# Patient Record
Sex: Female | Born: 1995 | Race: Black or African American | Hispanic: No | Marital: Single | Smoking: Never smoker
Health system: Southern US, Community
[De-identification: ages and names within clinical notes are randomized; demographics above are authoritative.]

## PROBLEM LIST (undated history)

## (undated) ENCOUNTER — Inpatient Hospital Stay (HOSPITAL_COMMUNITY): Payer: Self-pay

## (undated) ENCOUNTER — Inpatient Hospital Stay: Payer: Self-pay

## (undated) DIAGNOSIS — N83209 Unspecified ovarian cyst, unspecified side: Secondary | ICD-10-CM

## (undated) DIAGNOSIS — T8859XA Other complications of anesthesia, initial encounter: Secondary | ICD-10-CM

## (undated) DIAGNOSIS — J45909 Unspecified asthma, uncomplicated: Secondary | ICD-10-CM

## (undated) DIAGNOSIS — N390 Urinary tract infection, site not specified: Secondary | ICD-10-CM

## (undated) DIAGNOSIS — Z8759 Personal history of other complications of pregnancy, childbirth and the puerperium: Secondary | ICD-10-CM

## (undated) DIAGNOSIS — N879 Dysplasia of cervix uteri, unspecified: Secondary | ICD-10-CM

## (undated) DIAGNOSIS — D649 Anemia, unspecified: Secondary | ICD-10-CM

## (undated) DIAGNOSIS — R87629 Unspecified abnormal cytological findings in specimens from vagina: Secondary | ICD-10-CM

## (undated) DIAGNOSIS — T4145XA Adverse effect of unspecified anesthetic, initial encounter: Secondary | ICD-10-CM

## (undated) DIAGNOSIS — I10 Essential (primary) hypertension: Secondary | ICD-10-CM

## (undated) HISTORY — DX: Urinary tract infection, site not specified: N39.0

## (undated) HISTORY — DX: Dysplasia of cervix uteri, unspecified: N87.9

## (undated) HISTORY — PX: THERAPEUTIC ABORTION: SHX798

---
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
## 2015-05-21 ENCOUNTER — Emergency Department
Admission: EM | Admit: 2015-05-21 | Discharge: 2015-05-21 | Disposition: A | Discharge status: Home / Self Care | Payer: Medicaid Other

## 2015-05-22 ENCOUNTER — Encounter: Payer: Self-pay | Admitting: Emergency Medicine

## 2015-05-22 ENCOUNTER — Emergency Department
Admission: EM | Admit: 2015-05-22 | Discharge: 2015-05-22 | Disposition: A | Discharge status: Home / Self Care | Payer: BLUE CROSS/BLUE SHIELD | Attending: Emergency Medicine | Admitting: Emergency Medicine

## 2015-05-22 DIAGNOSIS — N390 Urinary tract infection, site not specified: Secondary | ICD-10-CM | POA: Insufficient documentation

## 2015-05-22 DIAGNOSIS — A5901 Trichomonal vulvovaginitis: Secondary | ICD-10-CM | POA: Insufficient documentation

## 2015-05-22 DIAGNOSIS — Z880 Allergy status to penicillin: Secondary | ICD-10-CM | POA: Diagnosis not present

## 2015-05-22 DIAGNOSIS — N898 Other specified noninflammatory disorders of vagina: Secondary | ICD-10-CM | POA: Diagnosis present

## 2015-05-22 DIAGNOSIS — Z3202 Encounter for pregnancy test, result negative: Secondary | ICD-10-CM | POA: Insufficient documentation

## 2015-05-22 DIAGNOSIS — Z87891 Personal history of nicotine dependence: Secondary | ICD-10-CM | POA: Insufficient documentation

## 2015-05-22 LAB — POCT PREGNANCY, URINE: Preg Test, Ur: NEGATIVE

## 2015-05-22 LAB — URINALYSIS COMPLETE WITH MICROSCOPIC (ARMC ONLY)
Bilirubin Urine: NEGATIVE
Glucose, UA: NEGATIVE mg/dL
Hgb urine dipstick: NEGATIVE
Ketones, ur: NEGATIVE mg/dL
Nitrite: NEGATIVE
Protein, ur: 30 mg/dL — AB
Specific Gravity, Urine: 1.028 (ref 1.005–1.030)
pH: 5 (ref 5.0–8.0)

## 2015-05-22 LAB — CHLAMYDIA/NGC RT PCR (ARMC ONLY)
Chlamydia Tr: NOT DETECTED
N gonorrhoeae: NOT DETECTED

## 2015-05-22 LAB — WET PREP, GENITAL
Clue Cells Wet Prep HPF POC: NONE SEEN
Yeast Wet Prep HPF POC: NONE SEEN

## 2015-05-22 MED ORDER — METRONIDAZOLE 500 MG PO TABS: 500.0000 mg | ORAL_TABLET | Freq: Two times a day (BID) | ORAL | Status: DC

## 2015-05-22 MED ORDER — CIPROFLOXACIN HCL 500 MG PO TABS: 500.0000 mg | ORAL_TABLET | Freq: Two times a day (BID) | ORAL | Status: DC

## 2015-05-22 NOTE — Discharge Instructions (Signed)
Trichomoniasis °Trichomoniasis is an infection caused by an organism called Trichomonas. The infection can affect both women and men. In women, the outer female genitalia and the vagina are affected. In men, the penis is mainly affected, but the prostate and other reproductive organs can also be involved. Trichomoniasis is a sexually transmitted infection (STI) and is most often passed to another person through sexual contact.  °RISK FACTORS °· Having unprotected sexual intercourse. °· Having sexual intercourse with an infected partner. °SIGNS AND SYMPTOMS  °Symptoms of trichomoniasis in women include: °· Abnormal gray-green frothy vaginal discharge. °· Itching and irritation of the vagina. °· Itching and irritation of the area outside the vagina. °Symptoms of trichomoniasis in men include:  °· Penile discharge with or without pain. °· Pain during urination. This results from inflammation of the urethra. °DIAGNOSIS  °Trichomoniasis may be found during a Pap test or physical exam. Your health care provider may use one of the following methods to help diagnose this infection: °· Examining vaginal discharge under a microscope. For men, urethral discharge would be examined. °· Testing the pH of the vagina with a test tape. °· Using a vaginal swab test that checks for the Trichomonas organism. A test is available that provides results within a few minutes. °· Doing a culture test for the organism. This is not usually needed. °TREATMENT  °· You may be given medicine to fight the infection. Women should inform their health care provider if they could be or are pregnant. Some medicines used to treat the infection should not be taken during pregnancy. °· Your health care provider may recommend over-the-counter medicines or creams to decrease itching or irritation. °· Your sexual partner will need to be treated if infected. °HOME CARE INSTRUCTIONS  °· Take medicines only as directed by your health care provider. °· Take  over-the-counter medicine for itching or irritation as directed by your health care provider. °· Do not have sexual intercourse while you have the infection. °· Women should not douche or wear tampons while they have the infection. °· Discuss your infection with your partner. Your partner may have gotten the infection from you, or you may have gotten it from your partner. °· Have your sex partner get examined and treated if necessary. °· Practice safe, informed, and protected sex. °· See your health care provider for other STI testing. °SEEK MEDICAL CARE IF:  °· You still have symptoms after you finish your medicine. °· You develop abdominal pain. °· You have pain when you urinate. °· You have bleeding after sexual intercourse. °· You develop a rash. °· Your medicine makes you sick or makes you throw up (vomit). °MAKE SURE YOU: °· Understand these instructions. °· Will watch your condition. °· Will get help right away if you are not doing well or get worse. °Document Released: 05/01/2001 Document Revised: 03/22/2014 Document Reviewed: 08/17/2013 °ExitCare® Patient Information ©2015 [employer]. This information is not intended to replace advice given to you by your health care provider. Make sure you discuss any questions you have with your health care provider. ° °Urinary Tract Infection °A urinary tract infection (UTI) can occur any place along the urinary tract. The tract includes the kidneys, ureters, bladder, and urethra. A type of germ called bacteria often causes a UTI. UTIs are often helped with antibiotic medicine.  °HOME CARE  °· If given, take antibiotics as told by your doctor. Finish them even if you start to feel better. °· Drink enough fluids to keep your pee (  urine) clear or pale yellow. °· Avoid tea, drinks with caffeine, and bubbly (carbonated) drinks. °· Pee often. Avoid holding your pee in for a long time. °· Pee before and after having sex (intercourse). °· Wipe from front to back after you  poop (bowel movement) if you are a woman. Use each tissue only once. °GET HELP RIGHT AWAY IF:  °· You have back pain. °· You have lower belly (abdominal) pain. °· You have chills. °· You feel sick to your stomach (nauseous). °· You throw up (vomit). °· Your burning or discomfort with peeing does not go away. °· You have a fever. °· Your symptoms are not better in 3 days. °MAKE SURE YOU:  °· Understand these instructions. °· Will watch your condition. °· Will get help right away if you are not doing well or get worse. °Document Released: 04/23/2008 Document Revised: 07/30/2012 Document Reviewed: 06/05/2012 °ExitCare® Patient Information ©2015 [employer]. This information is not intended to replace advice given to you by your health care provider. Make sure you discuss any questions you have with your health care provider. ° °

## 2015-05-22 NOTE — ED Notes (Signed)
Report to kelly, rn

## 2015-05-22 NOTE — ED Notes (Signed)
Pt states has had vaginal itching, vaginal discharge, burning with urination and  Pelvic pain for several days. Pt states feels nauseated, pt denies fever at home.

## 2015-05-22 NOTE — ED Provider Notes (Signed)
[facility] Emergency Department Provider Note  ____________________________________________  Time seen: 7 AM  I have reviewed the triage vital signs and the nursing notes.   HISTORY  Chief Complaint Pelvic Pain and Vaginal Discharge    HPI Tiffany Sanchez is a 19 y.o. female who complains of vaginal itching and mild vaginal discomfort with discharge. She reports the symptoms have been going on for about 2 days. She does admit to unprotected intercourse occasionally. She denies fevers chills. She has mild dysuria. No nausea no vomiting. She describes the severity as mildand nothing improves the symptoms     History reviewed. No pertinent past medical history.  There are no active problems to display for this patient.   Past Surgical History  Procedure Laterality Date  . Therapeutic abortion      Current Outpatient Rx  Name  Route  Sig  Dispense  Refill  . ciprofloxacin (CIPRO) 500 MG tablet   Oral   Take 1 tablet (500 mg total) by mouth 2 (two) times daily.   14 tablet   0   . metroNIDAZOLE (FLAGYL) 500 MG tablet   Oral   Take 1 tablet (500 mg total) by mouth 2 (two) times daily after a meal.   14 tablet   0     Allergies Penicillins  History reviewed. No pertinent family history.  Social History History  Substance Use Topics  . Smoking status: Former Games developer  . Smokeless tobacco: Never Used  . Alcohol Use: No    Review of Systems  Constitutional: Negative for fever. Eyes: Negative for discharge ENT: Negative for sore throat  Respiratory: Negative for shortness of breath. Gastrointestinal: Negative for abdominal pain, vomiting and diarrhea. Genitourinary: Positive for dysuria and positive for vaginal discharge Musculoskeletal: Negative for back pain. Skin: Negative for rash. Neurological: Negative for headaches    10-point ROS otherwise negative.  ____________________________________________   PHYSICAL  EXAM:  VITAL SIGNS: ED Triage Vitals  Enc Vitals Group     BP 05/22/15 0637 123/67 mmHg     Pulse Rate 05/22/15 0637 84     Resp 05/22/15 0637 14     Temp 05/22/15 0637 98.5 F (36.9 C)     Temp Source 05/22/15 0637 Oral     SpO2 05/22/15 0637 98 %     Weight 05/22/15 0637 250 lb (113.399 kg)     Height 05/22/15 0637  (1.702 m)     Head Cir --      Peak Flow --      Pain Score 05/22/15 0641 5     Pain Loc --      Pain Edu? --      Excl. in GC? --      Constitutional: Alert and oriented. Well appearing and in no distress. Eyes: Conjunctivae are normal.  ENT   Head: Normocephalic and atraumatic.   Mouth/Throat: Mucous membranes are moist. Cardiovascular: Normal rate, regular rhythm. Normal and symmetric distal pulses are present in all extremities. No murmurs, rubs, or gallops. Respiratory: Normal respiratory effort without tachypnea nor retractions. Breath sounds are clear and equal bilaterally.  Gastrointestinal: Soft and non-tender in all quadrants. No distention. There is no CVA tenderness. Genitourinary: Thick whitish vaginal discharge surrounding cervix. Musculoskeletal: Nontender with normal range of motion in all extremities. No lower extremity tenderness nor edema. Neurologic:  Normal speech and language. No gross focal neurologic deficits are appreciated. Skin:  Skin is warm, dry and intact. No rash noted. Psychiatric: Mood and  affect are normal. Patient exhibits appropriate insight and judgment.  ____________________________________________    LABS (pertinent positives/negatives)  Labs Reviewed  WET PREP, GENITAL - Abnormal; Notable for the following:    Trich, Wet Prep FEW (*)    WBC, Wet Prep HPF POC FEW (*)    All other components within normal limits  URINALYSIS COMPLETEWITH MICROSCOPIC ([facility] ONLY) - Abnormal; Notable for the following:    Color, Urine YELLOW (*)    APPearance CLOUDY (*)    Protein, ur 30 (*)    Leukocytes, UA 3+ (*)     Bacteria, UA RARE (*)    Squamous Epithelial / LPF 6-30 (*)    All other components within normal limits  CHLAMYDIA/NGC RT PCR ([facility] ONLY)  POCT PREGNANCY, URINE    ____________________________________________   EKG None  ____________________________________________    RADIOLOGY I have personally reviewed any xrays that were ordered on this patient: None  ____________________________________________   PROCEDURES  Procedure(s) performed: none  Critical Care performed:none  ____________________________________________   INITIAL IMPRESSION / ASSESSMENT AND PLAN / ED COURSE  Pertinent labs & imaging results that were available during my care of the patient were reviewed by me and considered in my medical decision making (see chart for details).  Wet prep shows Trichomonas, urinalysis consistent with UTI. We'll need to write antibiotics for Flagyl and Cipro. Patient informed that partner needs to be treated for Trichomonas.  ____________________________________________   FINAL CLINICAL IMPRESSION(S) / ED DIAGNOSES  Final diagnoses:  Trichomonal vaginitis  UTI (lower urinary tract infection)     Jene Everyobert Armonte Tortorella, MD 05/22/15 [phone]

## 2015-05-22 NOTE — ED Notes (Signed)
Dr. Cyril Loosenkinner in to examine pt.

## 2016-01-19 ENCOUNTER — Emergency Department
Admission: EM | Admit: 2016-01-19 | Discharge: 2016-01-19 | Disposition: A | Discharge status: Home / Self Care | Payer: BLUE CROSS/BLUE SHIELD | Attending: Emergency Medicine | Admitting: Emergency Medicine

## 2016-01-19 DIAGNOSIS — B029 Zoster without complications: Secondary | ICD-10-CM

## 2016-01-19 DIAGNOSIS — Z880 Allergy status to penicillin: Secondary | ICD-10-CM | POA: Diagnosis not present

## 2016-01-19 DIAGNOSIS — L0100 Impetigo, unspecified: Secondary | ICD-10-CM | POA: Insufficient documentation

## 2016-01-19 DIAGNOSIS — Z87891 Personal history of nicotine dependence: Secondary | ICD-10-CM | POA: Insufficient documentation

## 2016-01-19 DIAGNOSIS — Z792 Long term (current) use of antibiotics: Secondary | ICD-10-CM | POA: Diagnosis not present

## 2016-01-19 DIAGNOSIS — R21 Rash and other nonspecific skin eruption: Secondary | ICD-10-CM | POA: Diagnosis present

## 2016-01-19 MED ORDER — MUPIROCIN 2 % EX OINT: TOPICAL_OINTMENT | CUTANEOUS | Status: DC

## 2016-01-19 MED ORDER — ACYCLOVIR 400 MG PO TABS: 800.0000 mg | ORAL_TABLET | Freq: Every day | ORAL | Status: DC

## 2016-01-19 NOTE — Discharge Instructions (Signed)
Shingles Shingles is an infection that causes a painful skin rash and fluid-filled blisters. Shingles is caused by the same virus that causes chickenpox. Shingles only develops in people who:  Have had chickenpox.  Have gotten the chickenpox vaccine. (This is rare.) The first symptoms of shingles may be itching, tingling, or pain in an area on your skin. A rash will follow in a few days or weeks. The rash is usually on one side of the body in a bandlike or beltlike pattern. Over time, the rash turns into fluid-filled blisters that break open, scab over, and dry up. Medicines may:  Help you manage pain.  Help you recover more quickly.  Help to prevent long-term problems. HOME CARE Medicines  Take medicines only as told by your doctor.  Apply an anti-itch or numbing cream to the affected area as told by your doctor. Blister and Rash Care  Take a cool bath or put cool compresses on the area of the rash or blisters as told by your doctor. This may help with pain and itching.  Keep your rash covered with a loose bandage (dressing). Wear loose-fitting clothing.  Keep your rash and blisters clean with mild soap and cool water or as told by your doctor.  Check your rash every day for signs of infection. These include redness, swelling, and pain that lasts or gets worse.  Do not pick your blisters.  Do not scratch your rash. General Instructions  Rest as told by your doctor.  Keep all follow-up visits as told by your doctor. This is important.  Until your blisters scab over, your infection can cause chickenpox in people who have never had it or been vaccinated against it. To prevent this from happening, avoid touching other people or being around other people, especially:  Babies.  Pregnant women.  Children who have eczema.  Elderly people who have transplants.  People who have chronic illnesses, such as leukemia or AIDS. GET HELP IF:  Your pain does not get better with  medicine.  Your pain does not get better after the rash heals.  Your rash looks infected. Signs of infection include:  Redness.  Swelling.  Pain that lasts or gets worse. GET HELP RIGHT AWAY IF:  The rash is on your face or nose.  You have pain in your face, pain around your eye area, or loss of feeling on one side of your face.  You have ear pain or you have ringing in your ear.  You have loss of taste.  Your condition gets worse.   This information is not intended to replace advice given to you by your health care provider. Make sure you discuss any questions you have with your health care provider.   Document Released: 04/23/2008 Document Revised: 11/26/2014 Document Reviewed: 08/17/2014 Elsevier Interactive Patient Education 2016 Else[employer]  Impetigo, Adult Impetigo is an infection of the skin. It commonly occurs in young children, but it can also occur in adults. The infection causes itchy blisters and sores that produce brownish-yellow fluid. As the fluid dries, it forms a thick, honey-colored crust. These skin changes usually occur on the face but can also affect other areas of the body. Impetigo usually goes away in 7-10 days with treatment. CAUSES Impetigo is caused by two types of bacteria. It may be caused by staphylococci or streptococci bacteria. These bacteria cause impetigo when they get under the surface of the skin. This often happens after some damage to the skin, such as damage from:  Cuts, scrapes, or scratches.  Insect bites, especially when you scratch the area of a bite.  Chickenpox or other illnesses that cause open skin sores.  Nail biting or chewing. Impetigo is contagious and can spread easily from one person to another. This may occur through close skin contact or by sharing towels, clothing, or other items with a person who has the infection. RISK FACTORS Some things that can increase the risk of getting this infection include:  Playing  sports that include skin-to-skin contact with others.  Having a skin condition with open sores.  Having many skin cuts or scrapes.  Living in an area that has high humidity levels.  Having poor hygiene.  Having high levels of staphylococci in your nose. SIGNS AND SYMPTOMS Impetigo usually starts out as small blisters, often on the face. The blisters then break open and turn into tiny sores (lesions) with a yellow crust. In some cases, the blisters cause itching or burning. With scratching, irritation, or lack of treatment, these small lesions may get larger. Scratching can also cause impetigo to spread to other parts of the body. The bacteria can get under the fingernails and spread when you touch another area of your skin. Other possible symptoms include:  Larger blisters.  Pus.  Swollen lymph glands. DIAGNOSIS This condition is usually diagnosed during a physical exam. A skin sample or sample of fluid from a blister may be taken for lab tests that involve growing bacteria (culture test). This can help confirm the diagnosis or help determine the best treatment. TREATMENT Mild impetigo can be treated with prescription antibiotic cream. Oral antibiotic medicine may be used in more severe cases. Medicines for itching may also be used. HOME CARE INSTRUCTIONS  Take medicines only as directed by your health care provider.  To help prevent impetigo from spreading to other body areas:  Keep your fingernails short and clean.  Do not scratch the blisters or sores.  Cover infected areas, if necessary, to keep from scratching.  Gently wash the infected areas with antibiotic soap and water.  Soak crusted areas in warm, soapy water using antibiotic soap.  Gently rub the areas to remove crusts. Do not scrub.  Wash your hands often to avoid spreading this infection.  Stay home until you have used an antibiotic cream for 48 hours (2 days) or an oral antibiotic medicine for 24 hours (1  day). You should only return to work and activities with other people if your skin shows significant improvement. PREVENTION  To keep the infection from spreading:  Stay home until you have used an antibiotic cream for 48 hours or an oral antibiotic for 24 hours.  Wash your hands often.  Do not engage in skin-to-skin contact with other people while you have still have blisters.  Do not share towels, washcloths, or bedding with others while you have the infection. SEEK MEDICAL CARE IF:  You develop more blisters or sores despite treatment.  Other family members get sores.  Your skin sores are not improving after 48 hours of treatment.  You have a fever. SEEK IMMEDIATE MEDICAL CARE IF:  You see spreading redness or swelling of the skin around your sores.  You see red streaks coming from your sores.  You develop a sore throat.   This information is not intended to replace advice given to you by your health care provider. Make sure you discuss any questions you have with your health care provider.   Document Released: 11/26/2014 Document Reviewed: 11/26/2014  Elsevier Interactive Patient Education [employer]   Take the prescription meds as directed. Follow-up with your provider for recheck as needed.

## 2016-01-19 NOTE — ED Notes (Signed)
Pt c/o swollen area above the top lip for the past week.

## 2016-01-19 NOTE — ED Provider Notes (Signed)
[facility] Emergency Department Provider Note ____________________________________________  Time seen: 1445  I have reviewed the triage vital signs and the nursing notes.  HISTORY  Chief Complaint  Abscess  HPI Tiffany Sanchez is a 20 y.o. female presents to the ED for evaluation of a lesion to the face that she could've last period she is unclear of the cause of this lesion on her face but has been evaluated by her primary care provider last week. She started on antifungal medications but notes the area has worsened. She now notes increasing Honey colored foods and crossed. She is also noting multiple blisters to the area. She describes that about 2 weeks prior to this visit, she noticed some pain & tenderness in the area where the rash now exist. She reports that she had chickenpox as a child. She rates her discomfort at 3/10 in triage. She rates her discomfort at a 3/10 in triage.  History reviewed. No pertinent past medical history.  There are no active problems to display for this patient.  Past Surgical History  Procedure Laterality Date  . Therapeutic abortion      Current Outpatient Rx  Name  Route  Sig  Dispense  Refill  . acyclovir (ZOVIRAX) 400 MG tablet   Oral   Take 2 tablets (800 mg total) by mouth 5 (five) times daily.   70 tablet   0   . ciprofloxacin (CIPRO) 500 MG tablet   Oral   Take 1 tablet (500 mg total) by mouth 2 (two) times daily.   14 tablet   0   . metroNIDAZOLE (FLAGYL) 500 MG tablet   Oral   Take 1 tablet (500 mg total) by mouth 2 (two) times daily after a meal.   14 tablet   0   . mupirocin ointment (BACTROBAN) 2 %      Apply to affected area 3 times daily   22 g   0    Allergies Penicillins  No family history on file.  Social History Social History  Substance Use Topics  . Smoking status: Former Games developer  . Smokeless tobacco: Never Used  . Alcohol Use: No   Review of Systems  Constitutional: Negative  for fever. Eyes: Negative for visual changes. ENT: Negative for sore throat. Cardiovascular: Negative for chest pain. Respiratory: Negative for shortness of breath. Gastrointestinal: Negative for abdominal pain, vomiting and diarrhea. Genitourinary: Negative for dysuria. Musculoskeletal: Negative for back pain. Skin: Positive for rash. Neurological: Negative for headaches, focal weakness or numbness. ____________________________________________  PHYSICAL EXAM:  VITAL SIGNS: ED Triage Vitals  Enc Vitals Group     BP 01/19/16 1343 138/81 mmHg     Pulse Rate 01/19/16 1343 89     Resp 01/19/16 1343 18     Temp 01/19/16 1343 99 F (37.2 C)     Temp Source 01/19/16 1343 Oral     SpO2 01/19/16 1343 100 %     Weight 01/19/16 1343 260 lb (117.935 kg)     Height 01/19/16 1343  (1.702 m)     Head Cir --      Peak Flow --      Pain Score 01/19/16 1344 3     Pain Loc --      Pain Edu? --      Excl. in GC? --    Constitutional: Alert and oriented. Well appearing and in no distress. Head: Normocephalic and atraumatic.      Eyes: Conjunctivae are normal. PERRL.  Normal extraocular movements      Ears: Canals clear. TMs intact bilaterally.   Nose: No congestion/rhinorrhea.   Mouth/Throat: Mucous membranes are moist.   Neck: Supple. No thyromegaly. Hematological/Lymphatic/Immunological: No cervical lymphadenopathy. Cardiovascular: Normal rate, regular rhythm.  Respiratory: Normal respiratory effort. No wheezes/rales/rhonchi. Gastrointestinal: Soft and nontender. No distention. Musculoskeletal: Nontender with normal range of motion in all extremities.  Neurologic:  Normal gait without ataxia. Normal speech and language. No gross focal neurologic deficits are appreciated. Skin:  Skin is warm, dry and intact. No rash noted. Patient with a lesion to the left nasolabial region of the face Scattered Distal Lesions Approaching the Jawline. The Larger Lesion Measures about  2 cm in  Diameter and Is Shown to Have Multiple Clustered Blisters. There Is Also Some Honey Colored Crust Noted. The Satellite Lesions on the Face Are in Various Stages of Healing and Scabbing. Psychiatric: Mood and affect are normal. Patient exhibits appropriate insight and judgment. ____________________________________________  INITIAL IMPRESSION / ASSESSMENT AND PLAN / ED COURSE  Patient with unusual skin lesion to the face that has an appearance of a shingles outbreak. There also appears to be some secondary staph infection. Patient is discharged with a prescription for Zovirax as well as a perception for Bactroban. She will follow with her primary care provider for wound check as discussed. ____________________________________________  FINAL CLINICAL IMPRESSION(S) / ED DIAGNOSES  Final diagnoses:  Shingles  Impetigo      Lissa Hoard, PA-C 01/19/16 1601  Sharman Bosques, MD 01/21/16 1555

## 2016-01-20 ENCOUNTER — Telehealth: Payer: Self-pay | Admitting: Emergency Medicine

## 2016-01-20 NOTE — ED Notes (Signed)
Called pt in response to message.  She need note for school.   She says the rash is crusted over and looking better now.  i explained about avoiding immunocompromised persons and persons vulnerable to chicken pox.  She agrees.

## 2016-03-28 ENCOUNTER — Other Ambulatory Visit: Payer: Self-pay | Admitting: Family Medicine

## 2016-03-28 DIAGNOSIS — R102 Pelvic and perineal pain: Secondary | ICD-10-CM

## 2016-04-02 ENCOUNTER — Ambulatory Visit: Payer: BLUE CROSS/BLUE SHIELD

## 2016-08-09 ENCOUNTER — Other Ambulatory Visit: Payer: Self-pay | Admitting: Physician Assistant

## 2016-08-09 DIAGNOSIS — Z3491 Encounter for supervision of normal pregnancy, unspecified, first trimester: Secondary | ICD-10-CM

## 2016-08-10 ENCOUNTER — Other Ambulatory Visit: Payer: Self-pay | Admitting: Physician Assistant

## 2016-08-10 DIAGNOSIS — Z3491 Encounter for supervision of normal pregnancy, unspecified, first trimester: Secondary | ICD-10-CM

## 2016-08-13 ENCOUNTER — Emergency Department: Payer: BLUE CROSS/BLUE SHIELD

## 2016-08-13 ENCOUNTER — Encounter: Payer: Self-pay | Admitting: Emergency Medicine

## 2016-08-13 ENCOUNTER — Emergency Department
Admission: EM | Admit: 2016-08-13 | Discharge: 2016-08-14 | Disposition: A | Discharge status: Home / Self Care | Payer: BLUE CROSS/BLUE SHIELD | Attending: Emergency Medicine | Admitting: Emergency Medicine

## 2016-08-13 DIAGNOSIS — R600 Localized edema: Secondary | ICD-10-CM | POA: Diagnosis not present

## 2016-08-13 DIAGNOSIS — Z87891 Personal history of nicotine dependence: Secondary | ICD-10-CM | POA: Diagnosis not present

## 2016-08-13 DIAGNOSIS — R1030 Lower abdominal pain, unspecified: Secondary | ICD-10-CM | POA: Insufficient documentation

## 2016-08-13 DIAGNOSIS — Z3A01 Less than 8 weeks gestation of pregnancy: Secondary | ICD-10-CM | POA: Diagnosis not present

## 2016-08-13 DIAGNOSIS — Z792 Long term (current) use of antibiotics: Secondary | ICD-10-CM | POA: Diagnosis not present

## 2016-08-13 DIAGNOSIS — O0281 Inappropriate change in quantitative human chorionic gonadotropin (hCG) in early pregnancy: Secondary | ICD-10-CM | POA: Diagnosis not present

## 2016-08-13 DIAGNOSIS — Z79899 Other long term (current) drug therapy: Secondary | ICD-10-CM | POA: Insufficient documentation

## 2016-08-13 DIAGNOSIS — O26891 Other specified pregnancy related conditions, first trimester: Secondary | ICD-10-CM | POA: Diagnosis present

## 2016-08-13 DIAGNOSIS — R609 Edema, unspecified: Secondary | ICD-10-CM

## 2016-08-13 DIAGNOSIS — Z3491 Encounter for supervision of normal pregnancy, unspecified, first trimester: Secondary | ICD-10-CM

## 2016-08-13 LAB — POCT PREGNANCY, URINE: Preg Test, Ur: POSITIVE — AB

## 2016-08-13 LAB — BASIC METABOLIC PANEL
Anion gap: 8 (ref 5–15)
BUN: 12 mg/dL (ref 6–20)
CO2: 21 mmol/L — ABNORMAL LOW (ref 22–32)
Calcium: 9.3 mg/dL (ref 8.9–10.3)
Chloride: 108 mmol/L (ref 101–111)
Creatinine, Ser: 0.65 mg/dL (ref 0.44–1.00)
GFR calc Af Amer: >60 mL/min (ref >60)
GFR calc non Af Amer: >60 mL/min (ref >60)
Glucose, Bld: 95 mg/dL (ref 65–99)
Potassium: 3.2 mmol/L — ABNORMAL LOW (ref 3.5–5.1)
Sodium: 137 mmol/L (ref 135–145)

## 2016-08-13 LAB — URINALYSIS COMPLETE WITH MICROSCOPIC (ARMC ONLY)
Bilirubin Urine: NEGATIVE
Glucose, UA: NEGATIVE mg/dL
Ketones, ur: NEGATIVE mg/dL
Nitrite: NEGATIVE
Protein, ur: NEGATIVE mg/dL
Specific Gravity, Urine: 1.02 (ref 1.005–1.030)
pH: 5 (ref 5.0–8.0)

## 2016-08-13 LAB — CBC WITH DIFFERENTIAL/PLATELET
Basophils Absolute: 0.1 10*3/uL (ref 0–0.1)
Basophils Relative: 1 %
Eosinophils Absolute: 0.3 10*3/uL (ref 0–0.7)
Eosinophils Relative: 3 %
HCT: 31.6 % — ABNORMAL LOW (ref 35.0–47.0)
Hemoglobin: 10.6 g/dL — ABNORMAL LOW (ref 12.0–16.0)
Lymphocytes Relative: 23 %
Lymphs Abs: 2.7 10*3/uL (ref 1.0–3.6)
MCH: 25.7 pg — ABNORMAL LOW (ref 26.0–34.0)
MCHC: 33.6 g/dL (ref 32.0–36.0)
MCV: 76.4 fL — ABNORMAL LOW (ref 80.0–100.0)
Monocytes Absolute: 0.5 10*3/uL (ref 0.2–0.9)
Monocytes Relative: 5 %
Neutro Abs: 7.9 10*3/uL — ABNORMAL HIGH (ref 1.4–6.5)
Neutrophils Relative %: 68 %
Platelets: 292 10*3/uL (ref 150–440)
RBC: 4.14 MIL/uL (ref 3.80–5.20)
RDW: 14.5 % (ref 11.5–14.5)
WBC: 11.4 10*3/uL — ABNORMAL HIGH (ref 3.6–11.0)

## 2016-08-13 LAB — HCG, QUANTITATIVE, PREGNANCY: hCG, Beta Chain, Quant, S: 7292 m[IU]/mL — ABNORMAL HIGH (ref <5)

## 2016-08-13 NOTE — ED Notes (Signed)
Patient is back from ultra sound.

## 2016-08-13 NOTE — ED Triage Notes (Signed)
C/O ankle swelling x 2 days.  Worse after working.  Also c/o low abdominal pain when bending down and low back pain.  Patient is [redacted] weeks pregnant.  Has not yet had first prenatal visit.

## 2016-08-13 NOTE — ED Provider Notes (Signed)
[facility] Emergency Department Provider Note   ____________________________________________    I have reviewed the triage vital signs and the nursing notes.   HISTORY  Chief Complaint Abdominal pain    HPI Renee RamusBianca I Cornforth is a 20 y.o. female who believes that she is approximately [redacted] weeks pregnant who presents with complaints of mild abdominal pain. She describes it as a "stretching sensation in her lower abdomen when she bends over. When she stands up straight she has no pain. She denies vaginal bleeding. She denies fevers chills. She also describes mild swelling of her ankles bilaterally. No calf pain or swelling or tenderness. No shortness of breath. She has an appointment with the health Department tomorrow. This is her first pregnancy   History reviewed. No pertinent past medical history.  There are no active problems to display for this patient.   Past Surgical History:  Procedure Laterality Date  . THERAPEUTIC ABORTION      Prior to Admission medications   Medication Sig Start Date End Date Taking? Authorizing Provider  acyclovir (ZOVIRAX) 400 MG tablet Take 2 tablets (800 mg total) by mouth 5 (five) times daily. 01/19/16   Jenise V Bacon Menshew, PA-C  ciprofloxacin (CIPRO) 500 MG tablet Take 1 tablet (500 mg total) by mouth 2 (two) times daily. 05/22/15   Jene Everyobert Pandora Mccrackin, MD  metroNIDAZOLE (FLAGYL) 500 MG tablet Take 1 tablet (500 mg total) by mouth 2 (two) times daily after a meal. 05/22/15   Jene Everyobert Winna Golla, MD  mupirocin ointment Idelle Jo(BACTROBAN) 2 % Apply to affected area 3 times daily 01/19/16   Charlesetta IvoryJenise V Bacon Menshew, PA-C     Allergies Penicillins  No family history on file.  Social History Social History  Substance Use Topics  . Smoking status: Former Games developermoker  . Smokeless tobacco: Never Used  . Alcohol use No    Review of Systems  Constitutional: No fever/chills Eyes: No visual changes.   Cardiovascular: Denies chest  pain. Respiratory: Denies shortness of breath. Gastrointestinal:   No nausea, no vomiting.   Genitourinary: Negative for dysuria.No vaginal discharge Musculoskeletal: Negative for back pain. Skin: Negative for rash. Neurological: Negative for headaches   10-point ROS otherwise negative.  ____________________________________________   PHYSICAL EXAM:  VITAL SIGNS: ED Triage Vitals  Enc Vitals Group     BP 08/13/16 1830 136/73     Pulse Rate 08/13/16 1830 (!) 110     Resp 08/13/16 1830 18     Temp 08/13/16 1830 97.9 F (36.6 C)     Temp Source 08/13/16 1830 Oral     SpO2 08/13/16 1830 100 %     Weight 08/13/16 1828 270 lb (122.5 kg)     Height 08/13/16 1828 5\' 7"  (1.702 m)     Head Circumference --      Peak Flow --      Pain Score 08/13/16 1828 8     Pain Loc --      Pain Edu? --      Excl. in GC? --     Constitutional: Alert and oriented. No acute distress. Pleasant and interactive Eyes: Conjunctivae are normal.  Head: Atraumatic. Nose: No congestion/rhinnorhea. Mouth/Throat: Mucous membranes are moist.    Cardiovascular: Normal rate, regular rhythm. Grossly normal heart sounds.  Good peripheral circulation. Respiratory: Normal respiratory effort.  No retractions. Lungs CTAB. Gastrointestinal: Soft and nontender. No distention.  No CVA tenderness. Genitourinary: deferred Musculoskeletal: No lower extremity tenderness nor edema.  Warm and well perfused Neurologic:  Normal speech and language. No gross focal neurologic deficits are appreciated.  Skin:  Skin is warm, dry and intact. No rash noted. Psychiatric: Mood and affect are normal. Speech and behavior are normal.  ____________________________________________   LABS (all labs ordered are listed, but only abnormal results are displayed)  Labs Reviewed  CBC WITH DIFFERENTIAL/PLATELET - Abnormal; Notable for the following:       Result Value   WBC 11.4 (*)    Hemoglobin 10.6 (*)    HCT 31.6 (*)    MCV 76.4  (*)    MCH 25.7 (*)    Neutro Abs 7.9 (*)    All other components within normal limits  BASIC METABOLIC PANEL - Abnormal; Notable for the following:    Potassium 3.2 (*)    CO2 21 (*)    All other components within normal limits  URINALYSIS COMPLETEWITH MICROSCOPIC ([facility] ONLY) - Abnormal; Notable for the following:    Color, Urine YELLOW (*)    APPearance CLOUDY (*)    Hgb urine dipstick 1+ (*)    Leukocytes, UA 1+ (*)    Bacteria, UA RARE (*)    Squamous Epithelial / LPF 6-30 (*)    All other components within normal limits  POCT PREGNANCY, URINE - Abnormal; Notable for the following:    Preg Test, Ur POSITIVE (*)    All other components within normal limits  HCG, QUANTITATIVE, PREGNANCY  POC URINE PREG, ED   ____________________________________________  EKG  None ____________________________________________  RADIOLOGY  Ultrasound pending ____________________________________________   PROCEDURES  Procedure(s) performed: No    Critical Care performed:No ____________________________________________   INITIAL IMPRESSION / ASSESSMENT AND PLAN / ED COURSE  Pertinent labs & imaging results that were available during my care of the patient were reviewed by me and considered in my medical decision making (see chart for details).  Patient with mild abdominal discomfort, likely normal.  we'll obtain ultrasound to ensure IUP  Clinical Course  I have asked Dr. Manson Passey to f/u on [place] results ____________________________________________   FINAL CLINICAL IMPRESSION(S) / ED DIAGNOSES  First trimester pregnancy    NEW MEDICATIONS STARTED DURING THIS VISIT:  New Prescriptions   No medications on file     Note:  This document was prepared using Dragon voice recognition software and may include unintentional dictation errors.    Jene Every, MD 08/13/16 2201

## 2016-08-13 NOTE — ED Notes (Signed)
Patient left for ultrasound.

## 2016-08-15 ENCOUNTER — Ambulatory Visit
Admission: RE | Admit: 2016-08-15 | Discharge: 2016-08-15 | Disposition: A | Discharge status: Home / Self Care | Payer: BLUE CROSS/BLUE SHIELD | Source: Ambulatory Visit | Attending: Physician Assistant | Admitting: Physician Assistant

## 2016-08-15 DIAGNOSIS — Z3491 Encounter for supervision of normal pregnancy, unspecified, first trimester: Secondary | ICD-10-CM

## 2016-08-29 ENCOUNTER — Ambulatory Visit
Admission: RE | Admit: 2016-08-29 | Discharge: 2016-08-29 | Disposition: A | Discharge status: Home / Self Care | Payer: BLUE CROSS/BLUE SHIELD | Source: Ambulatory Visit | Attending: Physician Assistant | Admitting: Physician Assistant

## 2016-08-29 DIAGNOSIS — Z3A01 Less than 8 weeks gestation of pregnancy: Secondary | ICD-10-CM | POA: Diagnosis not present

## 2016-08-29 DIAGNOSIS — Z3491 Encounter for supervision of normal pregnancy, unspecified, first trimester: Secondary | ICD-10-CM | POA: Diagnosis present

## 2016-09-21 ENCOUNTER — Encounter: Payer: Self-pay | Admitting: Emergency Medicine

## 2016-09-21 ENCOUNTER — Emergency Department
Admission: EM | Admit: 2016-09-21 | Discharge: 2016-09-21 | Disposition: A | Discharge status: Home / Self Care | Payer: BLUE CROSS/BLUE SHIELD | Attending: Emergency Medicine | Admitting: Emergency Medicine

## 2016-09-21 DIAGNOSIS — Z87891 Personal history of nicotine dependence: Secondary | ICD-10-CM | POA: Insufficient documentation

## 2016-09-21 DIAGNOSIS — O219 Vomiting of pregnancy, unspecified: Secondary | ICD-10-CM | POA: Insufficient documentation

## 2016-09-21 DIAGNOSIS — Z79899 Other long term (current) drug therapy: Secondary | ICD-10-CM | POA: Diagnosis not present

## 2016-09-21 DIAGNOSIS — Z3491 Encounter for supervision of normal pregnancy, unspecified, first trimester: Secondary | ICD-10-CM

## 2016-09-21 DIAGNOSIS — O26891 Other specified pregnancy related conditions, first trimester: Secondary | ICD-10-CM | POA: Insufficient documentation

## 2016-09-21 DIAGNOSIS — R112 Nausea with vomiting, unspecified: Secondary | ICD-10-CM

## 2016-09-21 DIAGNOSIS — R197 Diarrhea, unspecified: Secondary | ICD-10-CM

## 2016-09-21 DIAGNOSIS — Z3A11 11 weeks gestation of pregnancy: Secondary | ICD-10-CM | POA: Insufficient documentation

## 2016-09-21 MED ORDER — DIPHENHYDRAMINE HCL 25 MG PO CAPS
25.0000 mg | ORAL_CAPSULE | Freq: Once | ORAL | Status: AC
  Administered 2016-09-21: 25 mg via ORAL
  Filled 2016-09-21: qty 1

## 2016-09-21 MED ORDER — METOCLOPRAMIDE HCL 10 MG PO TABS: 10.0000 mg | ORAL_TABLET | Freq: Four times a day (QID) | ORAL | 0 refills | Status: DC | PRN

## 2016-09-21 MED ORDER — FAMOTIDINE 20 MG PO TABS: 20.0000 mg | ORAL_TABLET | Freq: Two times a day (BID) | ORAL | 0 refills | Status: DC

## 2016-09-21 MED ORDER — ONDANSETRON 4 MG PO TBDP
8.0000 mg | ORAL_TABLET | Freq: Once | ORAL | Status: AC
  Administered 2016-09-21: 8 mg via ORAL
  Filled 2016-09-21: qty 2

## 2016-09-21 MED ORDER — SODIUM CHLORIDE 0.9 % IV BOLUS (SEPSIS): 1000.0000 mL | Freq: Once | INTRAVENOUS | Status: DC

## 2016-09-21 MED ORDER — FAMOTIDINE 20 MG PO TABS
40.0000 mg | ORAL_TABLET | Freq: Once | ORAL | Status: AC
  Administered 2016-09-21: 40 mg via ORAL
  Filled 2016-09-21: qty 2

## 2016-09-21 NOTE — ED Notes (Signed)
Pt given ginger ale, tolerating it well 

## 2016-09-21 NOTE — ED Triage Notes (Signed)
Pt states she began vomiting this morning and has diarrhea. Pt states she is [redacted] weeks pregnant.

## 2016-09-21 NOTE — ED Provider Notes (Signed)
[facility] Emergency Department Provider Note  ____________________________________________  Time seen: Approximately 8:18 AM  I have reviewed the triage vital signs and the nursing notes.   HISTORY  Chief Complaint Emesis and Diarrhea    HPI Tiffany Sanchez is a 20 y.o. female who complains of nausea vomiting and diarrhea for the past 2-3 hours. 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 Yesterday she started to have a little bit of malaise, and then this morning she developed the GI symptoms. This is different from the usual morning sickness that she has had during her pregnancy which she is managing well. She is eating and drinking well. No other issues such as contractions fluid leakage or vaginal bleeding. No vaginal discharge. No new urinary symptoms. Taking prenatal vitamins.     History reviewed. No pertinent past medical history.   There are no active problems to display for this patient.    Past Surgical History:  Procedure Laterality Date  . THERAPEUTIC ABORTION       Prior to Admission medications   Medication Sig Start Date End Date Taking? Authorizing Provider  acyclovir (ZOVIRAX) 400 MG tablet Take 2 tablets (800 mg total) by mouth 5 (five) times daily. 01/19/16   Jenise V Bacon Menshew, PA-C  ciprofloxacin (CIPRO) 500 MG tablet Take 1 tablet (500 mg total) by mouth 2 (two) times daily. 05/22/15   Jene Everyobert Kinner, MD  famotidine (PEPCID) 20 MG tablet Take 1 tablet (20 mg total) by mouth 2 (two) times daily. 09/21/16   Sharman CheekPhillip Noal Abshier, MD  metoCLOPramide (REGLAN) 10 MG tablet Take 1 tablet (10 mg total) by mouth every 6 (six) hours as needed. 09/21/16   Sharman CheekPhillip Alyzah Pelly, MD  metroNIDAZOLE (FLAGYL) 500 MG tablet Take 1 tablet (500 mg total) by mouth 2 (two) times daily after a meal. 05/22/15   Jene Everyobert Kinner, MD  mupirocin ointment Idelle Jo(BACTROBAN) 2 % Apply to affected area 3 times  daily 01/19/16   Charlesetta IvoryJenise V Bacon Menshew, PA-C     Allergies Penicillins   No family history on file.  Social History Social History  Substance Use Topics  . Smoking status: Former Games developermoker  . Smokeless tobacco: Never Used  . Alcohol use No    Review of Systems  Constitutional:   No fever or chills.  ENT:   Positive rhinorrhea. Cardiovascular:   No chest pain. Respiratory:   No dyspnea or cough. Gastrointestinal:   Negative for abdominal pain, positive vomiting and diarrhea.  10-point ROS otherwise negative.  ____________________________________________   PHYSICAL EXAM:  VITAL SIGNS: ED Triage Vitals [09/21/16 0731]  Enc Vitals Group     BP (!) 142/81     Pulse Rate (!) 115     Resp 18     Temp 98 F (36.7 C)     Temp Source Oral     SpO2 99 %     Weight 275 lb (124.7 kg)     Height 5\' 7"  (1.702 m)     Head Circumference      Peak Flow      Pain Score 5     Pain Loc      Pain Edu?      Excl. in GC?     Vital signs reviewed, nursing assessments reviewed.   Constitutional:   Alert and oriented. Well appearing and in no distress. Eyes:   No scleral icterus. No conjunctival pallor. PERRL.  EOMI.  No nystagmus. ENT   Head:   Normocephalic and atraumatic.   Nose:   No congestion/rhinnorhea. No septal hematoma   Mouth/Throat:   MMM, no pharyngeal erythema. No peritonsillar mass.    Neck:   No stridor. No SubQ emphysema. No meningismus. Hematological/Lymphatic/Immunilogical:   No cervical lymphadenopathy. Cardiovascular:   RRR. Symmetric bilateral radial and DP pulses.  No murmurs.  Respiratory:   Normal respiratory effort without tachypnea nor retractions. Breath sounds are clear and equal bilaterally. No wheezes/rales/rhonchi. Gastrointestinal:   Soft and nontender. Non distended. There is no CVA tenderness.  No rebound, rigidity, or guarding. Genitourinary:   deferred Musculoskeletal:   Nontender with normal range of motion in all extremities. No  joint effusions.  No lower extremity tenderness.  No edema. Neurologic:   Normal speech and language.  CN 2-10 normal. Motor grossly intact. No gross focal neurologic deficits are appreciated.  Skin:    Skin is warm, dry and intact. No rash noted.  No petechiae, purpura, or bullae.  ____________________________________________    LABS (pertinent positives/negatives) (all labs ordered are listed, but only abnormal results are displayed) Labs Reviewed - No data to display ____________________________________________   EKG    ____________________________________________    RADIOLOGY    ____________________________________________   PROCEDURES Procedures  ____________________________________________   INITIAL IMPRESSION / ASSESSMENT AND PLAN / ED COURSE  Pertinent labs & imaging results that were available during my care of the patient were reviewed by me and considered in my medical decision making (see chart for details).  Patient well appearing no acute distress. Exam is completely benign. By history is highly consistent with a viral gastroenteritis.Considering the patient's symptoms, medical history, and physical examination today, I have low suspicion for cholecystitis or biliary pathology, pancreatitis, perforation or bowel obstruction, hernia, intra-abdominal abscess, AAA or dissection, volvulus or intussusception, mesenteric ischemia, or appendicitis.  No apparent consultations of pregnancy. Low suspicion for significant dehydration or hyperemesis. Patient tolerating oral intake after Zofran. We'll continue on Reglan and famotidine for symptom control, follow up with primary care.     Clinical Course   ____________________________________________   FINAL CLINICAL IMPRESSION(S) / ED DIAGNOSES  Final diagnoses:  Nausea vomiting and diarrhea  First trimester pregnancy       Portions of this note were generated with dragon dictation software. Dictation  errors may occur despite best attempts at proofreading.    Sharman CheekPhillip Natajah Derderian, MD 09/21/16 [phone]

## 2016-10-20 ENCOUNTER — Encounter: Payer: Self-pay | Admitting: Emergency Medicine

## 2016-10-20 DIAGNOSIS — Z3A15 15 weeks gestation of pregnancy: Secondary | ICD-10-CM | POA: Diagnosis not present

## 2016-10-20 DIAGNOSIS — O26892 Other specified pregnancy related conditions, second trimester: Secondary | ICD-10-CM | POA: Diagnosis present

## 2016-10-20 DIAGNOSIS — Z79899 Other long term (current) drug therapy: Secondary | ICD-10-CM | POA: Diagnosis not present

## 2016-10-20 DIAGNOSIS — J45909 Unspecified asthma, uncomplicated: Secondary | ICD-10-CM | POA: Insufficient documentation

## 2016-10-20 DIAGNOSIS — R1013 Epigastric pain: Secondary | ICD-10-CM | POA: Insufficient documentation

## 2016-10-20 DIAGNOSIS — Z87891 Personal history of nicotine dependence: Secondary | ICD-10-CM | POA: Insufficient documentation

## 2016-10-20 LAB — CBC WITH DIFFERENTIAL/PLATELET
Basophils Absolute: 0.1 10*3/uL (ref 0–0.1)
Basophils Relative: 1 %
Eosinophils Absolute: 0.4 10*3/uL (ref 0–0.7)
Eosinophils Relative: 4 %
HCT: 32.2 % — ABNORMAL LOW (ref 35.0–47.0)
Hemoglobin: 10.5 g/dL — ABNORMAL LOW (ref 12.0–16.0)
Lymphocytes Relative: 25 %
Lymphs Abs: 2.8 10*3/uL (ref 1.0–3.6)
MCH: 25.1 pg — ABNORMAL LOW (ref 26.0–34.0)
MCHC: 32.7 g/dL (ref 32.0–36.0)
MCV: 77 fL — ABNORMAL LOW (ref 80.0–100.0)
Monocytes Absolute: 0.8 10*3/uL (ref 0.2–0.9)
Monocytes Relative: 8 %
Neutro Abs: 6.9 10*3/uL — ABNORMAL HIGH (ref 1.4–6.5)
Neutrophils Relative %: 62 %
Platelets: 276 10*3/uL (ref 150–440)
RBC: 4.19 MIL/uL (ref 3.80–5.20)
RDW: 15.1 % — ABNORMAL HIGH (ref 11.5–14.5)
WBC: 10.9 10*3/uL (ref 3.6–11.0)

## 2016-10-20 LAB — URINALYSIS COMPLETE WITH MICROSCOPIC (ARMC ONLY)
Bilirubin Urine: NEGATIVE
Glucose, UA: NEGATIVE mg/dL
Ketones, ur: NEGATIVE mg/dL
Nitrite: NEGATIVE
Protein, ur: NEGATIVE mg/dL
Specific Gravity, Urine: 1.008 (ref 1.005–1.030)
pH: 6 (ref 5.0–8.0)

## 2016-10-20 NOTE — ED Triage Notes (Signed)
Pt reports eating at 8pm and reports a full feeling.

## 2016-10-20 NOTE — ED Triage Notes (Signed)
Pt ambulatory to triage with boyfriend, c/o sharp "tight" epigastric pain (10/10 - intermittent and worse with deep breathing)that started approx 2230.  Pt is 15 weeks preg (seen OB at [facility]).  Pt denies N/V/D, dysuria, bleeding or vaginal discharge, BM today reported as "normal".  Pt reports good appetite.   Pt hx of asthma and takes prenatal vitamins.

## 2016-10-21 ENCOUNTER — Emergency Department
Admission: EM | Admit: 2016-10-21 | Discharge: 2016-10-21 | Disposition: A | Discharge status: Home / Self Care | Payer: Medicaid Other | Attending: Emergency Medicine | Admitting: Emergency Medicine

## 2016-10-21 DIAGNOSIS — R1013 Epigastric pain: Secondary | ICD-10-CM

## 2016-10-21 HISTORY — DX: Unspecified asthma, uncomplicated: J45.909

## 2016-10-21 LAB — COMPREHENSIVE METABOLIC PANEL
ALT: 12 U/L — ABNORMAL LOW (ref 14–54)
AST: 18 U/L (ref 15–41)
Albumin: 3.5 g/dL (ref 3.5–5.0)
Alkaline Phosphatase: 34 U/L — ABNORMAL LOW (ref 38–126)
Anion gap: 9 (ref 5–15)
BUN: 10 mg/dL (ref 6–20)
CO2: 20 mmol/L — ABNORMAL LOW (ref 22–32)
Calcium: 9.4 mg/dL (ref 8.9–10.3)
Chloride: 106 mmol/L (ref 101–111)
Creatinine, Ser: 0.63 mg/dL (ref 0.44–1.00)
GFR calc Af Amer: >60 mL/min (ref >60)
GFR calc non Af Amer: >60 mL/min (ref >60)
Glucose, Bld: 100 mg/dL — ABNORMAL HIGH (ref 65–99)
Potassium: 3.8 mmol/L (ref 3.5–5.1)
Sodium: 135 mmol/L (ref 135–145)
Total Bilirubin: 0.4 mg/dL (ref 0.3–1.2)
Total Protein: 7.4 g/dL (ref 6.5–8.1)

## 2016-10-21 LAB — HCG, QUANTITATIVE, PREGNANCY: hCG, Beta Chain, Quant, S: 47382 m[IU]/mL — ABNORMAL HIGH (ref <5)

## 2016-10-21 LAB — LIPASE, BLOOD: Lipase: 18 U/L (ref 11–51)

## 2016-10-21 LAB — TYPE AND SCREEN
ABO/RH(D): A POS
Antibody Screen: NEGATIVE

## 2016-10-21 NOTE — ED Provider Notes (Signed)
[facility] Emergency Department Provider Note    ____________________________________________   I have reviewed the triage vital signs and the nursing notes.   HISTORY  Chief Complaint Abdominal Pain   History limited by: Not Limited   HPI Tiffany Sanchez is a 20 y.o. female who presents to the emergency department todayat roughly [redacted] weeks pregnant and because of concerns for abdominal pain. The patient states that the pain started this afternoon. She states it started shortly after she finished eating. It was located in the epigastric region. The pain was sharp. It did not radiate into her chest or back. By the time of my examination it had resolved on its own. She denies any associated nausea. She denies similar symptoms in the past. She does state that she has been dealing with some heartburn during this pregnancy. She denies any recent fevers or illness. Denies any change in defecation, diarrhea or bloody stool. No fevers.   Past Medical History:  Diagnosis Date  . Asthma     There are no active problems to display for this patient.   Past Surgical History:  Procedure Laterality Date  . THERAPEUTIC ABORTION      Prior to Admission medications   Medication Sig Start Date End Date Taking? Authorizing Provider  acyclovir (ZOVIRAX) 400 MG tablet Take 2 tablets (800 mg total) by mouth 5 (five) times daily. 01/19/16   Jenise V Bacon Menshew, PA-C  ciprofloxacin (CIPRO) 500 MG tablet Take 1 tablet (500 mg total) by mouth 2 (two) times daily. 05/22/15   Jene Everyobert Kinner, MD  famotidine (PEPCID) 20 MG tablet Take 1 tablet (20 mg total) by mouth 2 (two) times daily. 09/21/16   Sharman CheekPhillip Stafford, MD  metoCLOPramide (REGLAN) 10 MG tablet Take 1 tablet (10 mg total) by mouth every 6 (six) hours as needed. 09/21/16   Sharman CheekPhillip Stafford, MD  metroNIDAZOLE (FLAGYL) 500 MG tablet Take 1 tablet (500 mg total) by mouth 2 (two) times daily after a meal. 05/22/15   Jene Everyobert Kinner, MD   mupirocin ointment Idelle Jo(BACTROBAN) 2 % Apply to affected area 3 times daily 01/19/16   Charlesetta IvoryJenise V Bacon Menshew, PA-C    Allergies Penicillins  History reviewed. No pertinent family history.  Social History Social History  Substance Use Topics  . Smoking status: Former Games developermoker  . Smokeless tobacco: Never Used  . Alcohol use No    Review of Systems  Constitutional: Negative for fever. Cardiovascular: Negative for chest pain. Respiratory: Negative for shortness of breath. Gastrointestinal: Positive for epigastric pain. Musculoskeletal: Negative for back pain. Skin: Negative for rash. Neurological: Negative for headaches, focal weakness or numbness. 10-point ROS otherwise negative.  ____________________________________________   PHYSICAL EXAM:  VITAL SIGNS: ED Triage Vitals  Enc Vitals Group     BP 10/20/16 2251 129/75     Pulse Rate 10/20/16 2251 (!) 115     Resp 10/20/16 2251 18     Temp 10/20/16 2251 98.4 F (36.9 C)     Temp Source 10/20/16 2251 Oral     SpO2 10/20/16 2251 100 %     Weight 10/20/16 2252 275 lb (124.7 kg)     Height 10/20/16 2252 5\' 7"  (1.702 m)     Head Circumference --      Peak Flow --      Pain Score 10/20/16 2253 8   Constitutional: Alert and oriented. Well appearing and in no distress. Eyes: Conjunctivae are normal. Normal extraocular movements. ENT   Head: Normocephalic and atraumatic.  Nose: No congestion/rhinnorhea.   Mouth/Throat: Mucous membranes are moist.   Neck: No stridor. Cardiovascular: Normal rate, regular rhythm.  No murmurs, rubs, or gallops.  Respiratory: Normal respiratory effort without tachypnea nor retractions. Breath sounds are clear and equal bilaterally. No wheezes/rales/rhonchi. Gastrointestinal: Soft and nontender. No distention.  Genitourinary: Deferred Musculoskeletal: Normal range of motion in all extremities. Neurologic:  Normal speech and language. No gross focal neurologic deficits are appreciated.   Skin:  Skin is warm, dry and intact. No rash noted. Psychiatric: Mood and affect are normal. Speech and behavior are normal. Patient exhibits appropriate insight and judgment.  ____________________________________________    LABS (pertinent positives/negatives)  Labs Reviewed  COMPREHENSIVE METABOLIC PANEL - Abnormal; Notable for the following:       Result Value   CO2 20 (*)    Glucose, Bld 100 (*)    ALT 12 (*)    Alkaline Phosphatase 34 (*)    All other components within normal limits  URINALYSIS COMPLETEWITH MICROSCOPIC ([facility] ONLY) - Abnormal; Notable for the following:    Color, Urine YELLOW (*)    APPearance HAZY (*)    Hgb urine dipstick 1+ (*)    Leukocytes, UA 3+ (*)    Bacteria, UA RARE (*)    Squamous Epithelial / LPF 6-30 (*)    All other components within normal limits  HCG, QUANTITATIVE, PREGNANCY - Abnormal; Notable for the following:    hCG, Beta Chain, Quant, S [id number],[id number] (*)    All other components within normal limits  CBC WITH DIFFERENTIAL/PLATELET - Abnormal; Notable for the following:    Hemoglobin 10.5 (*)    HCT 32.2 (*)    MCV 77.0 (*)    MCH 25.1 (*)    RDW 15.1 (*)    Neutro Abs 6.9 (*)    All other components within normal limits  LIPASE, BLOOD  TYPE AND SCREEN     ____________________________________________   EKG  None  ____________________________________________    RADIOLOGY  None   ____________________________________________   PROCEDURES  Procedures  ____________________________________________   INITIAL IMPRESSION / ASSESSMENT AND PLAN / ED COURSE  Pertinent labs & imaging results that were available during my care of the patient were reviewed by me and considered in my medical decision making (see chart for details).  Patient presented to the emergency department today after an episode of epigastric pain. By the time my examination the pain had resolved. Patient was not tender in the abdomen. This point I doubt  pancreatitis, cholecystitis or free intraperitoneal air. I think a gastritis possible, I do think biliary colic also on the differential. I discussed the patient. At this point will treat for gastritis. Patient declined antacids and rather would want dietary modification initially. Think this is reasonable. The patient can follow-up with OB/GYN. ____________________________________________   FINAL CLINICAL IMPRESSION(S) / ED DIAGNOSES  Final diagnoses:  Epigastric pain     Note: This dictation was prepared with Dragon dictation. Any transcriptional errors that result from this process are unintentional    Phineas SemenGraydon Symia Herdt, MD 10/21/16 0130

## 2016-10-21 NOTE — ED Notes (Signed)
Pt reports she developed epigastric pain tonight. Pain has since gone away. Pt denies n/v/d or fevers. [redacted] weeks pregnant and followed by [facility].G2 P0 A1

## 2016-10-21 NOTE — Discharge Instructions (Signed)
Please seek medical attention for any high fevers, chest pain, shortness of breath, change in behavior, persistent vomiting, bloody stool or any other new or concerning symptoms.  

## 2016-11-19 NOTE — L&D Delivery Note (Signed)
Delivery Note At 3:32 AM on 04/15/17 a viable and female   was delivered via Vaginal, Spontaneous Delivery (Presentation:ROA VTX ;  ).  APGAR: ,7/0 ; weight 8 lb 4.3 oz (3750 g).  Moderate shoulder dystocia required McRobert's and suprapubic pressure . MLE made . Delivery of left anterior shoulder . Good mavt of both arms after delivery  Placenta status:membranes teased out  , .  Cord:  with the following complications: Moderate Shoulder dystocia.    Anesthesia:  CLE Episiotomy:  MLE Lacerations:  none Suture Repair: 2.0 3.0 vicryl Est. Blood Loss (mL):  350 cc  Mom to postpartum.  Baby to Couplet care / Skin to Skin.  Ihor Austinhomas J Schermerhorn 04/14/2017, 4:13 AM

## 2016-12-17 ENCOUNTER — Observation Stay
Admission: EM | Admit: 2016-12-17 | Discharge: 2016-12-18 | Disposition: A | Discharge status: Home / Self Care | Payer: Medicaid Other | Attending: Obstetrics and Gynecology | Admitting: Obstetrics and Gynecology

## 2016-12-17 DIAGNOSIS — Z3A26 26 weeks gestation of pregnancy: Secondary | ICD-10-CM | POA: Diagnosis not present

## 2016-12-17 DIAGNOSIS — R1030 Lower abdominal pain, unspecified: Secondary | ICD-10-CM | POA: Diagnosis not present

## 2016-12-17 DIAGNOSIS — R109 Unspecified abdominal pain: Secondary | ICD-10-CM

## 2016-12-17 DIAGNOSIS — O26892 Other specified pregnancy related conditions, second trimester: Secondary | ICD-10-CM | POA: Diagnosis present

## 2016-12-17 DIAGNOSIS — O26899 Other specified pregnancy related conditions, unspecified trimester: Secondary | ICD-10-CM | POA: Diagnosis present

## 2016-12-17 LAB — URINALYSIS, ROUTINE W REFLEX MICROSCOPIC
Bilirubin Urine: NEGATIVE
Glucose, UA: NEGATIVE mg/dL
Hgb urine dipstick: NEGATIVE
Ketones, ur: NEGATIVE mg/dL
Nitrite: NEGATIVE
Protein, ur: NEGATIVE mg/dL
Specific Gravity, Urine: 1.021 (ref 1.005–1.030)
pH: 6 (ref 5.0–8.0)

## 2016-12-17 NOTE — Discharge Instructions (Signed)
Get plenty of rest, drink plenty of fluids

## 2016-12-17 NOTE — Progress Notes (Signed)
Patient ID: Renee RamusBianca I Miggins, female   DOB: 12/27/1995, 20 y.o.   MRN: [id number] Renee RamusBianca I Seto 04/15/1996 G2 P0 at 23+3 weeks   presents for lower abd pain .  noLOF , no vaginal bleeding , no F/N/V/ diarrhea.  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

## 2016-12-17 NOTE — Discharge Summary (Signed)
  Patient ID: Renee RamusBianca I Grunden, female   DOB: 05/08/1996, 20 y.o.   MRN: [id number] Renee RamusBianca I Ertl 06/24/1996 G2 P0 at 23+3 weeks   presents for lower abd pain .  noLOF , no vaginal bleeding , no F/N/V/ diarrhea.  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

## 2016-12-17 NOTE — OB Triage Note (Signed)
Discharge instructions reviewed with patient and significant other. Patient denies questions at this time Patient received work note per her request, ambulatory off unit at this time in stable condition accompanied by significant other.

## 2016-12-17 NOTE — OB Triage Note (Signed)
Patient arrived complaining of constant abdominal pain starting today that radiates into back. Patient endorses fetal movement, denies vaginal bleeding.States that she thinks pain is related to standing all day while at work. Monitors applied

## 2016-12-18 DIAGNOSIS — O26892 Other specified pregnancy related conditions, second trimester: Secondary | ICD-10-CM | POA: Diagnosis not present

## 2016-12-27 ENCOUNTER — Encounter: Payer: Self-pay | Admitting: Emergency Medicine

## 2016-12-27 ENCOUNTER — Emergency Department
Admission: EM | Admit: 2016-12-27 | Discharge: 2016-12-28 | Disposition: A | Discharge status: Home / Self Care | Payer: Medicaid Other | Attending: Emergency Medicine | Admitting: Emergency Medicine

## 2016-12-27 DIAGNOSIS — Z3492 Encounter for supervision of normal pregnancy, unspecified, second trimester: Secondary | ICD-10-CM

## 2016-12-27 DIAGNOSIS — J45909 Unspecified asthma, uncomplicated: Secondary | ICD-10-CM | POA: Diagnosis not present

## 2016-12-27 DIAGNOSIS — R0789 Other chest pain: Secondary | ICD-10-CM | POA: Insufficient documentation

## 2016-12-27 DIAGNOSIS — O26892 Other specified pregnancy related conditions, second trimester: Secondary | ICD-10-CM | POA: Insufficient documentation

## 2016-12-27 DIAGNOSIS — Z3A25 25 weeks gestation of pregnancy: Secondary | ICD-10-CM | POA: Insufficient documentation

## 2016-12-27 DIAGNOSIS — Z79899 Other long term (current) drug therapy: Secondary | ICD-10-CM | POA: Diagnosis not present

## 2016-12-27 DIAGNOSIS — R0602 Shortness of breath: Secondary | ICD-10-CM | POA: Insufficient documentation

## 2016-12-27 LAB — CBC
HCT: 31.9 % — ABNORMAL LOW (ref 35.0–47.0)
Hemoglobin: 10.4 g/dL — ABNORMAL LOW (ref 12.0–16.0)
MCH: 25.3 pg — ABNORMAL LOW (ref 26.0–34.0)
MCHC: 32.7 g/dL (ref 32.0–36.0)
MCV: 77.4 fL — ABNORMAL LOW (ref 80.0–100.0)
Platelets: 272 10*3/uL (ref 150–440)
RBC: 4.13 MIL/uL (ref 3.80–5.20)
RDW: 16.5 % — ABNORMAL HIGH (ref 11.5–14.5)
WBC: 12.7 10*3/uL — ABNORMAL HIGH (ref 3.6–11.0)

## 2016-12-27 LAB — BASIC METABOLIC PANEL
Anion gap: 8 (ref 5–15)
BUN: 10 mg/dL (ref 6–20)
CO2: 21 mmol/L — ABNORMAL LOW (ref 22–32)
Calcium: 9.4 mg/dL (ref 8.9–10.3)
Chloride: 106 mmol/L (ref 101–111)
Creatinine, Ser: 0.62 mg/dL (ref 0.44–1.00)
GFR calc Af Amer: >60 mL/min (ref >60)
GFR calc non Af Amer: >60 mL/min (ref >60)
Glucose, Bld: 91 mg/dL (ref 65–99)
Potassium: 3.6 mmol/L (ref 3.5–5.1)
Sodium: 135 mmol/L (ref 135–145)

## 2016-12-27 LAB — TROPONIN I: Troponin I: <0.03 ng/mL (ref <0.03)

## 2016-12-27 NOTE — ED Triage Notes (Signed)
Patient states that she is [redacted] weeks pregnant. Patient with complaint of chest tightness and shortness of breath times three days.

## 2016-12-27 NOTE — ED Provider Notes (Signed)
[facility] Emergency Department Provider Note   ____________________________________________   First MD Initiated Contact with Patient 12/27/16 2341     (approximate)  I have reviewed the triage vital signs and the nursing notes.   HISTORY  Chief Complaint Shortness of Breath and Chest Pain    HPI Tiffany Sanchez is a 21 y.o. female who presents to the ED from home with chief complaint of chest tightness and shortness of breath. Patient is G2 P0 Ab1 approximately [redacted] weeks pregnant. Reports a 3 day history of constant chest tightness and shortness of breath which is exacerbated by bending over and exertion. 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 Specifically, patient denies recent fever, chills, abdominal pain, nausea, vomiting, vaginal bleeding, dysuria, diarrhea. Denies recent travel or trauma.She spoke with her OB yesterday and was told to come to the ER if her symptoms persisted. Denies worsening of symptoms. States she developed bilateral pedal edema at 7 weeks that has not worsened.   Past Medical History:  Diagnosis Date  . Asthma     Patient Active Problem List   Diagnosis Date Noted  . Abdominal pain affecting pregnancy 12/17/2016    Past Surgical History:  Procedure Laterality Date  . THERAPEUTIC ABORTION      Prior to Admission medications   Medication Sig Start Date End Date Taking? Authorizing Provider  acyclovir (ZOVIRAX) 400 MG tablet Take 2 tablets (800 mg total) by mouth 5 (five) times daily. 01/19/16   Jenise V Bacon Menshew, PA-C  ciprofloxacin (CIPRO) 500 MG tablet Take 1 tablet (500 mg total) by mouth 2 (two) times daily. Patient not taking: Reported on 12/17/2016 05/22/15   Jene Every, MD  famotidine (PEPCID) 20 MG tablet Take 1 tablet (20 mg total) by mouth 2 (two) times daily. 09/21/16   Sharman Krausz, MD  metoCLOPramide (REGLAN) 10 MG tablet Take 1 tablet (10 mg  total) by mouth every 6 (six) hours as needed. 09/21/16   Sharman Saintjean, MD  metroNIDAZOLE (FLAGYL) 500 MG tablet Take 1 tablet (500 mg total) by mouth 2 (two) times daily after a meal. Patient not taking: Reported on 12/17/2016 05/22/15   Jene Every, MD  mupirocin ointment Idelle Jo) 2 % Apply to affected area 3 times daily Patient not taking: Reported on 12/17/2016 01/19/16   Charlesetta Ivory Menshew, PA-C    Allergies Penicillins  No family history on file.  Social History Social History  Substance Use Topics  . Smoking status: Never Smoker  . Smokeless tobacco: Never Used  . Alcohol use No    Review of Systems  Constitutional: No fever/chills. Eyes: No visual changes. ENT: No sore throat. Cardiovascular: Positive for chest pain. Respiratory: Positive for shortness of breath. Gastrointestinal: No abdominal pain.  No nausea, no vomiting.  No diarrhea.  No constipation. Genitourinary: Negative for dysuria. Musculoskeletal: Negative for back pain. Skin: Negative for rash. Neurological: Negative for headaches, focal weakness or numbness.  10-point ROS otherwise negative.  ____________________________________________   PHYSICAL EXAM:  VITAL SIGNS: ED Triage Vitals [12/27/16 2134]  Enc Vitals Group     BP 129/70     Pulse Rate 98     Resp 18     Temp 98.3 F (36.8 C)     Temp Source Oral     SpO2 100 %     Weight 280 lb (127 kg)     Height 5\' 7"  (1.702 m)  Head Circumference      Peak Flow      Pain Score 0     Pain Loc      Pain Edu?      Excl. in GC?     Constitutional: Alert and oriented. Well appearing and in no acute distress. Eyes: Conjunctivae are normal. PERRL. EOMI. Head: Atraumatic. Nose: No congestion/rhinnorhea. Mouth/Throat: Mucous membranes are moist.  Oropharynx non-erythematous. Neck: No stridor.   Cardiovascular: Normal rate, regular rhythm. Grossly normal heart sounds.  Good peripheral circulation. Respiratory: Normal respiratory  effort.  No retractions. Lungs CTAB. Gastrointestinal: Gravid and nontender. No distention. No abdominal bruits. No CVA tenderness. Musculoskeletal: No lower extremity tenderness. 1+ nonpitting BLE edema.  No joint effusions. Neurologic:  Normal speech and language. No gross focal neurologic deficits are appreciated. No gait instability. Skin:  Skin is warm, dry and intact. No rash noted. Psychiatric: Mood and affect are normal. Speech and behavior are normal.  ____________________________________________   LABS (all labs ordered are listed, but only abnormal results are displayed)  Labs Reviewed  BASIC METABOLIC PANEL - Abnormal; Notable for the following:       Result Value   CO2 21 (*)    All other components within normal limits  CBC - Abnormal; Notable for the following:    WBC 12.7 (*)    Hemoglobin 10.4 (*)    HCT 31.9 (*)    MCV 77.4 (*)    MCH 25.3 (*)    RDW 16.5 (*)    All other components within normal limits  TROPONIN I   ____________________________________________  EKG  ED ECG REPORT I, Enrico Eaddy J, the attending physician, personally viewed and interpreted this ECG.   Date: 12/28/2016  EKG Time: 2137  Rate: 95  Rhythm: normal EKG, normal sinus rhythm  Axis: Within normal limits  Intervals:none  ST&T Change: Nonspecific  ____________________________________________  RADIOLOGY  Patient declined ____________________________________________   PROCEDURES  Procedure(s) performed: None  Procedures  Critical Care performed: No  ____________________________________________   INITIAL IMPRESSION / ASSESSMENT AND PLAN / ED COURSE  Pertinent labs & imaging results that were available during my care of the patient were reviewed by me and considered in my medical decision making (see chart for details).  21 year old female approximately [redacted] weeks pregnant who presents with a three-day history of chest tightness and shortness of breath. Patient has  clear lungs auscultation, heart rate normal, normal respirations with room air saturations 100%. No evidence of fluid overload or pregnancy induced hypertension. We had an extensive discussion between the patient, her family member and myself regarding risks and benefits of obtaining chest x-ray as well as CT chest to evaluate for pulmonary embolism. Most likely patient's symptoms are secondary to the developing pregnancy as well as her weight; however, we did discuss that CT scan would definitively evaluate PE. At this time patient would like to hold off on both CT as well as chest x-ray and follow-up with her OB. She will be taken to L&D for fetal monitoring. She knows to return at once to the emergency department for worsening symptoms. Strict return precautions given. Both patient and family member verbalized understanding and agree with plan of care.      ____________________________________________   FINAL CLINICAL IMPRESSION(S) / ED DIAGNOSES  Final diagnoses:  Second trimester pregnancy  Chest tightness  Shortness of breath      NEW MEDICATIONS STARTED DURING THIS VISIT:  New Prescriptions   No medications on file  Note:  This document was prepared using Dragon voice recognition software and may include unintentional dictation errors.    Irean Hong, MD 12/28/16 [id number]

## 2016-12-28 ENCOUNTER — Inpatient Hospital Stay
Admit: 2016-12-28 | Discharge: 2016-12-28 | Disposition: A | Discharge status: Home / Self Care | Payer: BLUE CROSS/BLUE SHIELD | Attending: Obstetrics & Gynecology | Admitting: Obstetrics & Gynecology

## 2016-12-28 DIAGNOSIS — R0602 Shortness of breath: Secondary | ICD-10-CM | POA: Diagnosis present

## 2016-12-28 NOTE — Procedures (Signed)
NST  Patient's last menstrual period was 07/06/2016 (exact date). Estimated Date of Delivery: 04/12/17  Baseline: 145 Variability: moderate Accelerations present x >2 (10x10) Decelerations absent Time 20mins  Interpretation: reactive NST, category 1 tracing  ----- Ranae Plumberhelsea Ward, MD Attending Obstetrician and Gynecologist [facility], Department of OB/GYN [facility]

## 2016-12-28 NOTE — OB Triage Note (Signed)
Patient came in complaining of SOB. Cleared by Er. Per Dr. Elesa MassedWard, patient may be discharged with reactive 20 min strip. Monitors applied, patient denies SOB at this time.

## 2016-12-28 NOTE — Discharge Instructions (Signed)
Please return to the ER for worsening symptoms, persistent vomiting, abdominal pain, vaginal bleeding, difficulty breathing or other concerns.

## 2016-12-28 NOTE — Discharge Instructions (Signed)
Drink plenty of fluids, get plenty of rest

## 2016-12-28 NOTE — Discharge Summary (Signed)
Tiffany RamusBianca I Sanchez is a 21 y.o. female. She is at 5150w0d gestation. Patient's last menstrual period was 07/06/2016 (exact date). Estimated Date of Delivery: 04/12/17  Chief Complaint:  S: Resting comfortably. no CTX, no VB.no LOF,  Active fetal movement.   Location: lungs Context: patient has complained of chest tightness and SOB x 3 days which resolved spontaneously upon negative workup in ED Onset/timing/duration: 3 days Quality: mild Severity: mild Aggravating factors: nothing Alleviating factors: nothing Associated signs/symptoms: denies elevated BP, change vision, HA, LE tenderness, +recent cold, +sick contacts  Problems this pregnancy: 1. Anemia 2. Morbid Obesity (46) 3. Ovarian cyst 4. Yeast infection  Maternal Medical History:   Past Medical History:  Diagnosis Date  . Asthma     Past Surgical History:  Procedure Laterality Date  . THERAPEUTIC ABORTION      Allergies  Allergen Reactions  . Penicillins     Prior to Admission medications   Medication Sig Start Date End Date Taking? Authorizing Provider  acyclovir (ZOVIRAX) 400 MG tablet Take 2 tablets (800 mg total) by mouth 5 (five) times daily. 01/19/16  Yes Jenise V Bacon Menshew, PA-C  famotidine (PEPCID) 20 MG tablet Take 1 tablet (20 mg total) by mouth 2 (two) times daily. 09/21/16  Yes Sharman CheekPhillip Stafford, MD  metoCLOPramide (REGLAN) 10 MG tablet Take 1 tablet (10 mg total) by mouth every 6 (six) hours as needed. 09/21/16  Yes Sharman CheekPhillip Stafford, MD  ciprofloxacin (CIPRO) 500 MG tablet Take 1 tablet (500 mg total) by mouth 2 (two) times daily. Patient not taking: Reported on 12/17/2016 05/22/15   Jene Everyobert Kinner, MD  metroNIDAZOLE (FLAGYL) 500 MG tablet Take 1 tablet (500 mg total) by mouth 2 (two) times daily after a meal. Patient not taking: Reported on 12/17/2016 05/22/15   Jene Everyobert Kinner, MD  mupirocin ointment Idelle Jo(BACTROBAN) 2 % Apply to affected area 3 times daily Patient not taking: Reported on 12/17/2016 01/19/16   Lissa HoardJenise V  Bacon Menshew, PA-C     [facility]   Social History: She  reports that she has never smoked. She has never used smokeless tobacco. She reports that she does not drink alcohol or use drugs.  Family History: family history is not on file. no history of gyn cancers, HTN in mother  Review of Systems: A full review of systems was performed and negative except as noted in the HPI.     O:  BP 120/66   Pulse 94   Temp 98.2 F (36.8 C) (Oral)   Resp 16   LMP 07/06/2016 (Exact Date)  Results for orders placed or performed during the hospital encounter of 12/27/16 (from the past 48 hour(s))  Basic metabolic panel   Collection Time: 12/27/16  9:37 PM  Result Value Ref Range   Sodium 135 135 - 145 mmol/L   Potassium 3.6 3.5 - 5.1 mmol/L   Chloride 106 101 - 111 mmol/L   CO2 21 (L) 22 - 32 mmol/L   Glucose, Bld 91 65 - 99 mg/dL   BUN 10 6 - 20 mg/dL   Creatinine, Ser 0.980.62 0.44 - 1.00 mg/dL   Calcium 9.4 8.9 - 11.910.3 mg/dL   GFR calc non Af Amer >60 >60 mL/min   GFR calc Af Amer >60 >60 mL/min   Anion gap 8 5 - 15  CBC   Collection Time: 12/27/16  9:37 PM  Result Value Ref Range   WBC 12.7 (H) 3.6 - 11.0 K/uL   RBC 4.13 3.80 -  5.20 MIL/uL   Hemoglobin 10.4 (L) 12.0 - 16.0 g/dL   HCT 81.1 (L) 91.4 - 78.2 %   MCV 77.4 (L) 80.0 - 100.0 fL   MCH 25.3 (L) 26.0 - 34.0 pg   MCHC 32.7 32.0 - 36.0 g/dL   RDW 95.6 (H) 21.3 - 08.6 %   Platelets 272 150 - 440 K/uL  Troponin I   Collection Time: 12/27/16  9:37 PM  Result Value Ref Range   Troponin I <0.03 <0.03 ng/mL     Constitutional: NAD, AAOx3  HE/ENT: extraocular movements grossly intact, moist mucous membranes CV: RRR PULM: nl respiratory effort, CTABL     Abd: gravid, non-tender, non-distended, soft      Ext: Non-tender, Nonedmeatous   Psych: mood appropriate, speech normal Pelvic: deferred  FHT: 145 mod + 10x10 contractions TOCO: quiet   A/P:  20yo G2P0 @ [redacted]w[redacted]d with resolved SOB  Labor: not  present.   Fetal Wellbeing: Reassuring Cat 1 tracing.  SOB - resolved  NST reactive  D/c home stable, precautions reviewed, follow-up as scheduled.   ----- Ranae Plumber, MD Attending Obstetrician and Gynecologist [facility], Department of OB/GYN [facility]

## 2016-12-28 NOTE — OB Triage Note (Signed)
Patient able to be discharged at this time per Dr. Elesa MassedWard. Discharge instructions reviewed with patient and significant other. Patient denies questions at this time, ambulatory off unit in company of significant other.

## 2017-03-15 ENCOUNTER — Observation Stay
Admission: EM | Admit: 2017-03-15 | Discharge: 2017-03-15 | Disposition: A | Discharge status: Home / Self Care | Payer: Medicaid Other | Attending: Obstetrics and Gynecology | Admitting: Obstetrics and Gynecology

## 2017-03-15 DIAGNOSIS — Z3493 Encounter for supervision of normal pregnancy, unspecified, third trimester: Principal | ICD-10-CM | POA: Insufficient documentation

## 2017-03-15 DIAGNOSIS — O4290 Premature rupture of membranes, unspecified as to length of time between rupture and onset of labor, unspecified weeks of gestation: Secondary | ICD-10-CM | POA: Diagnosis present

## 2017-03-15 DIAGNOSIS — Z79899 Other long term (current) drug therapy: Secondary | ICD-10-CM | POA: Diagnosis not present

## 2017-03-15 DIAGNOSIS — Z880 Allergy status to penicillin: Secondary | ICD-10-CM | POA: Insufficient documentation

## 2017-03-15 NOTE — Progress Notes (Signed)
Discharge instructions reviewed and explained.  Questions answered.  Verbalized understanding.

## 2017-03-15 NOTE — Discharge Summary (Signed)
Tiffany Bouchard, MD  Obstetrics    Hide copied text Hover for attribution information Patient ID: Tiffany Sanchez, female   DOB: [birth year], 20 y.o.   MRN: [id number]  Tiffany Sanchez is a 21 y.o. female. She is at [redacted]w[redacted]d gestation. Patient's last menstrual period was 07/06/2016 (exact date). Estimated Date of Delivery: 04/12/17  P[facility] care site: [facility]   Chief complaint:LOF today at 0900   S: Resting comfortably. noCTX, no VB.no LOF, Active fetal movement.   Maternal Medical History:       Past Medical History:  Diagnosis Date  . Asthma          Past Surgical History:  Procedure Laterality Date  . THERAPEUTIC ABORTION          Allergies  Allergen Reactions  . Penicillins            Prior to Admission medications   Medication Sig Start Date End Date Taking? Authorizing Provider  acyclovir (ZOVIRAX) 400 MG tablet Take 2 tablets (800 mg total) by mouth 5 (five) times daily. 01/19/16   Jenise V Bacon Menshew, PA-C  ciprofloxacin (CIPRO) 500 MG tablet Take 1 tablet (500 mg total) by mouth 2 (two) times daily. Patient not taking: Reported on 12/17/2016 05/22/15   Jene Every, MD  famotidine (PEPCID) 20 MG tablet Take 1 tablet (20 mg total) by mouth 2 (two) times daily. 09/21/16   Sharman Brenneman, MD  metoCLOPramide (REGLAN) 10 MG tablet Take 1 tablet (10 mg total) by mouth every 6 (six) hours as needed. 09/21/16   Sharman Pascua, MD  metroNIDAZOLE (FLAGYL) 500 MG tablet Take 1 tablet (500 mg total) by mouth 2 (two) times daily after a meal. Patient not taking: Reported on 12/17/2016 05/22/15   Jene Every, MD  mupirocin ointment Idelle Jo) 2 % Apply to affected area 3 times daily Patient not taking: Reported on 12/17/2016 01/19/16   Lissa Hoard, PA-C     Social History: She  reports that she has never smoked. She has never used smokeless tobacco. She reports that she does not drink alcohol or use  drugs.  Family History: family history is not on file.  no history of gyn cancers  Review of Systems: A full review of systems was performed and negative except as noted in the HPI.     O:  BP 132/70 (BP Location: Left Arm)   Pulse 94   Temp 99 F (37.2 C) (Oral)   Resp 18   Ht  (1.702 m)   Wt (!) 141.1 kg (311 lb)   LMP 07/06/2016 (Exact Date)   BMI 48.71 kg/m  No results found for this or any previous visit (from the past 48 hour(s)).   Constitutional: NAD, AAOx3  HE/ENT: extraocular movements grossly intact, moist mucous membranes CV: RRR PULM: nl respiratory effort, CTABL                                         Abd: gravid, non-tender, non-distended, soft                                                  Ext: Non-tender, Nonedmeatous  Psych: mood appropriate, speech normal Pelvic deferred  NST:  Baseline: 150 Variability: moderate Accelerations present x >2 Decelerations absent Time  Reactive  SSE : neg pool , neg nitrazine , neg valsalva , neg Fern   A/P: 21 y.o. [redacted]w[redacted]d here for r/o rupture of membranes  No evidence of LOF  Precautions given to pt   ----- Tiffany Bouchard MD Attending Obstetrician and Gynecologist [facility], Department of OB/GYN [facility]     Electronically signed by Tiffany Bouchard, MD at 03/15/2017 2:21 PM      ED to Hosp-Admission (Current) on 03/15/2017

## 2017-03-15 NOTE — Progress Notes (Addendum)
Patient ID: Tiffany Sanchez, female   DOB: 1995-11-24, 21 y.o.   MRN: [id number]  Tiffany Sanchez is a 21 y.o. female. She is at [redacted]w[redacted]d gestation. Patient's last menstrual period was 07/06/2016 (exact date). Estimated Date of Delivery: 04/12/17  P[facility] care site: [facility]   Chief complaint:LOF today at 0900   S: Resting comfortably. no CTX, no VB.no LOF,  Active fetal movement.   Maternal Medical History:   Past Medical History:  Diagnosis Date  . Asthma     Past Surgical History:  Procedure Laterality Date  . THERAPEUTIC ABORTION      Allergies  Allergen Reactions  . Penicillins     Prior to Admission medications   Medication Sig Start Date End Date Taking? Authorizing Provider  acyclovir (ZOVIRAX) 400 MG tablet Take 2 tablets (800 mg total) by mouth 5 (five) times daily. 01/19/16   Jenise V Bacon Menshew, PA-C  ciprofloxacin (CIPRO) 500 MG tablet Take 1 tablet (500 mg total) by mouth 2 (two) times daily. Patient not taking: Reported on 12/17/2016 05/22/15   Jene Every, MD  famotidine (PEPCID) 20 MG tablet Take 1 tablet (20 mg total) by mouth 2 (two) times daily. 09/21/16   Sharman Rueda, MD  metoCLOPramide (REGLAN) 10 MG tablet Take 1 tablet (10 mg total) by mouth every 6 (six) hours as needed. 09/21/16   Sharman Gettel, MD  metroNIDAZOLE (FLAGYL) 500 MG tablet Take 1 tablet (500 mg total) by mouth 2 (two) times daily after a meal. Patient not taking: Reported on 12/17/2016 05/22/15   Jene Every, MD  mupirocin ointment Idelle Jo) 2 % Apply to affected area 3 times daily Patient not taking: Reported on 12/17/2016 01/19/16   Lissa Hoard, PA-C     Social History: She  reports that she has never smoked. She has never used smokeless tobacco. She reports that she does not drink alcohol or use drugs.  Family History: family history is not on file.  no history of gyn cancers  Review of Systems: A full review of systems was performed and negative except as  noted in the HPI.     O:  BP 132/70 (BP Location: Left Arm)   Pulse 94   Temp 99 F (37.2 C) (Oral)   Resp 18   Ht  (1.702 m)   Wt (!) 141.1 kg (311 lb)   LMP 07/06/2016 (Exact Date)   BMI 48.71 kg/m  No results found for this or any previous visit (from the past 48 hour(s)).   Constitutional: NAD, AAOx3  HE/ENT: extraocular movements grossly intact, moist mucous membranes CV: RRR PULM: nl respiratory effort, CTABL     Abd: gravid, non-tender, non-distended, soft      Ext: Non-tender, Nonedmeatous   Psych: mood appropriate, speech normal Pelvic deferred  NST:  Baseline: 150 Variability: moderate Accelerations present x >2 Decelerations absent Time  Reactive  SSE : neg pool , neg nitrazine , neg valsalva , neg Fern   A/P: 21 y.o. [redacted]w[redacted]d here for r/o rupture of membranes  No evidence of LOF  Precautions given to pt   ----- Suzy Bouchard MD Attending Obstetrician and Gynecologist [facility], Department of OB/GYN [facility]

## 2017-03-15 NOTE — OB Triage Note (Signed)
Recvd to OBS 2 with c/o leaking fluid today when she wiped. Changed to gown and to bed.  EFM applied.

## 2017-03-21 NOTE — Progress Notes (Signed)
I evaluated the patient here for BMI consult for epidural placement. She seems like she will be a good candidate for an epidural

## 2017-03-29 ENCOUNTER — Inpatient Hospital Stay
Admission: EM | Admit: 2017-03-29 | Discharge: 2017-03-29 | Disposition: A | Discharge status: Home / Self Care | Payer: Medicaid Other | Attending: Obstetrics and Gynecology | Admitting: Obstetrics and Gynecology

## 2017-03-29 ENCOUNTER — Encounter: Payer: Self-pay | Admitting: *Deleted

## 2017-03-29 DIAGNOSIS — Z3A38 38 weeks gestation of pregnancy: Secondary | ICD-10-CM | POA: Insufficient documentation

## 2017-03-29 DIAGNOSIS — O368130 Decreased fetal movements, third trimester, not applicable or unspecified: Secondary | ICD-10-CM | POA: Insufficient documentation

## 2017-03-29 NOTE — OB Triage Provider Note (Signed)
TRIAGE VISIT with NST   Tiffany Sanchez is a 21 y.o. G2P0010. She is at 4028w0d gestation.  Indication: Decreased fetal movement x1 day  S: Resting comfortably. no CTX, no VB. Active fetal movement now noted in triage.  O:  BP 129/72 (BP Location: Left Arm)   Pulse 95   Temp 98.9 F (37.2 C) (Oral)   Resp 16   Ht 5\' 7"  (1.702 m)   Wt (!) 311 lb (141.1 kg)   LMP 07/06/2016 (Exact Date)   BMI 48.71 kg/m  No results found for this or any previous visit (from the past 48 hour(s)).   Gen: NAD, AAOx3      Abd: FNTTP      Ext: Non-tender, Nonedmeatous    FHT: 140, mod var, + accels and no decels TOCO: quiet SVE:  deferrec   A/P:  21 y.o. G2P0010 428w0d with decreased fetal movement, now resolved.   Labor: not present.   Fetal Wellbeing: NST is Reassuring Cat 1 tracing.  D/c home stable, precautions reviewed, follow-up as scheduled.

## 2017-03-29 NOTE — Progress Notes (Signed)
Discharge instructions given and explained.  Questions answered.  Verbalized understanding.  Signed copy on chart.  DC ambulatory with significant other at side.

## 2017-03-29 NOTE — OB Triage Note (Signed)
Recvd to Obs 2 per wheelchair for c/o decreased fetal movement. Changed to gown and to bed.  EFM applied.  No c/o pain.  No leaking of fluid no vaginal bleeding.  Oriented to room and plan of care discussed.  Verbalized understanding and agrees with plan.

## 2017-03-29 NOTE — Progress Notes (Signed)
To bedside to discuss plan of care.  Will continue to monitor for 2hrs.  Verbalized understanding.

## 2017-04-01 ENCOUNTER — Observation Stay
Admission: EM | Admit: 2017-04-01 | Discharge: 2017-04-01 | Disposition: A | Discharge status: Home / Self Care | Payer: Medicaid Other | Attending: Obstetrics and Gynecology | Admitting: Obstetrics and Gynecology

## 2017-04-01 DIAGNOSIS — Z3A38 38 weeks gestation of pregnancy: Secondary | ICD-10-CM | POA: Diagnosis not present

## 2017-04-01 DIAGNOSIS — M25559 Pain in unspecified hip: Secondary | ICD-10-CM | POA: Diagnosis not present

## 2017-04-01 DIAGNOSIS — M545 Low back pain: Secondary | ICD-10-CM | POA: Diagnosis present

## 2017-04-01 DIAGNOSIS — Z79899 Other long term (current) drug therapy: Secondary | ICD-10-CM | POA: Insufficient documentation

## 2017-04-01 DIAGNOSIS — O26893 Other specified pregnancy related conditions, third trimester: Secondary | ICD-10-CM | POA: Diagnosis not present

## 2017-04-01 DIAGNOSIS — Z7982 Long term (current) use of aspirin: Secondary | ICD-10-CM | POA: Insufficient documentation

## 2017-04-01 NOTE — OB Triage Note (Signed)
Patient given discharge instructions including labor precautions, fetal kick count instructions, and follow up information.  Patient and significant other verbalized understanding.  Patient discharged in stable condition, ambulatory, accompanied by significant other.

## 2017-04-01 NOTE — Discharge Summary (Signed)
Patient ID: Tiffany Sanchez, female   DOB: 01/15/1996, 20 y.o.   MRN: [id number]  Tiffany RamusBianca I Sanchez is a 21 y.o. female. She is at 3712w3d gestation. Patient's last menstrual period was 07/06/2016 (exact date). Estimated Date of Delivery: 04/12/17  [facility]  Chief complaint:lower back pain and hip pain .  Started today . Good fetal movt     Maternal Medical History:       Past Medical History:  Diagnosis Date  . Asthma    no hospitalizations, no recent attacks (since childhood)         Past Surgical History:  Procedure Laterality Date  . THERAPEUTIC ABORTION          Allergies  Allergen Reactions  . Penicillins            Prior to Admission medications   Medication Sig Start Date End Date Taking? Authorizing Provider  acyclovir (ZOVIRAX) 400 MG tablet Take 2 tablets (800 mg total) by mouth 5 (five) times daily. 01/19/16  Yes Menshew, Charlesetta IvoryJenise V Bacon, PA-C  aspirin 81 MG chewable tablet Chew 81 mg by mouth daily.   Yes [provider]  Prenatal Vit-Fe Fumarate-FA (PRENATAL MULTIVITAMIN) TABS tablet Take 1 tablet by mouth daily at 12 noon.   Yes [provider]  ciprofloxacin (CIPRO) 500 MG tablet Take 1 tablet (500 mg total) by mouth 2 (two) times daily. Patient not taking: Reported on 12/17/2016 05/22/15   Jene EveryKinner, Robert, MD  famotidine (PEPCID) 20 MG tablet Take 1 tablet (20 mg total) by mouth 2 (two) times daily. Patient not taking: Reported on 04/01/2017 09/21/16   Sharman CheekStafford, Phillip, MD  metoCLOPramide (REGLAN) 10 MG tablet Take 1 tablet (10 mg total) by mouth every 6 (six) hours as needed. Patient not taking: Reported on 04/01/2017 09/21/16   Sharman CheekStafford, Phillip, MD  metroNIDAZOLE (FLAGYL) 500 MG tablet Take 1 tablet (500 mg total) by mouth 2 (two) times daily after a meal. Patient not taking: Reported on 12/17/2016 05/22/15   Jene EveryKinner, Robert, MD  mupirocin ointment Idelle Jo(BACTROBAN) 2 % Apply to affected area 3 times  daily Patient not taking: Reported on 12/17/2016 01/19/16   Menshew, Charlesetta IvoryJenise V Bacon, PA-C     Social History: She  reports that she has never smoked. She has never used smokeless tobacco. She reports that she does not drink alcohol or use drugs.  Family History: family history is not on file.  no history of gyn cancers  Review of Systems: A full review of systems was performed and negative except as noted in the HPI.     O:  BP 138/65 (BP Location: Left Arm)   Pulse (!) 106   Temp 98.7 F (37.1 C) (Oral)   Resp 18   Ht 5\' 7"  (1.702 m)   Wt (!) 315 lb (142.9 kg)   LMP 07/06/2016 (Exact Date)   BMI 49.34 kg/m  No results found for this or any previous visit (from the past 48 hour(s)).   Constitutional: NAD, AAOx3  HE/ENT: extraocular movements grossly intact, moist mucous membranes CV: RRR PULM: nl respiratory effort, CTABL                                         Abd: gravid, non-tender, non-distended, soft  Ext: Non-tender, Nonedmeatous                     Psych: mood appropriate, speech normal Pelvic by RN . Internal os closed  NST: reactive  No CTX    A/P: 21 y.o. [redacted]w[redacted]d here for antenatal surveillance for low back pain . Cervical exam   Labor: not present.   Fetal Wellbeing: Reassuring Cat 1 tracing.  Reactive NST   D/c home stable, precautions reviewed, follow-up as scheduled.   ----- Suzy Bouchard , MD Attending Obstetrician and Gynecologist [facility], Department of OB/GYN [facility]     Electronically signed by Severn Goddard, Ihor Austin, MD at 04/01/2017 8:48 PM

## 2017-04-01 NOTE — OB Triage Note (Signed)
Patient arrived in triage with c/o "sharp, shooting pain" in lower back that started around 1600 with abdominal pain that started around 1930.  States positive fetal movement, denies leaking of fluid and contractions.  Abdomen palpates soft. EFM explained and applied. Discussed plan of care. Patient verbalized understanding.

## 2017-04-01 NOTE — Progress Notes (Signed)
Patient ID: Tiffany Sanchez, female   DOB: 04/06/1996, 20 y.o.   MRN: [id number]  Tiffany Sanchez is a 21 y.o. female. She is at 8911w3d gestation. Patient's last menstrual period was 07/06/2016 (exact date). Estimated Date of Delivery: 04/12/17  [facility]  Chief complaint:lower back pain and hip pain .  Started today . Good fetal movt     Maternal Medical History:   Past Medical History:  Diagnosis Date  . Asthma    no hospitalizations, no recent attacks (since childhood)    Past Surgical History:  Procedure Laterality Date  . THERAPEUTIC ABORTION      Allergies  Allergen Reactions  . Penicillins     Prior to Admission medications   Medication Sig Start Date End Date Taking? Authorizing Provider  acyclovir (ZOVIRAX) 400 MG tablet Take 2 tablets (800 mg total) by mouth 5 (five) times daily. 01/19/16  Yes Menshew, Charlesetta IvoryJenise V Bacon, PA-C  aspirin 81 MG chewable tablet Chew 81 mg by mouth daily.   Yes [provider]  Prenatal Vit-Fe Fumarate-FA (PRENATAL MULTIVITAMIN) TABS tablet Take 1 tablet by mouth daily at 12 noon.   Yes [provider]  ciprofloxacin (CIPRO) 500 MG tablet Take 1 tablet (500 mg total) by mouth 2 (two) times daily. Patient not taking: Reported on 12/17/2016 05/22/15   Jene EveryKinner, Robert, MD  famotidine (PEPCID) 20 MG tablet Take 1 tablet (20 mg total) by mouth 2 (two) times daily. Patient not taking: Reported on 04/01/2017 09/21/16   Sharman CheekStafford, Phillip, MD  metoCLOPramide (REGLAN) 10 MG tablet Take 1 tablet (10 mg total) by mouth every 6 (six) hours as needed. Patient not taking: Reported on 04/01/2017 09/21/16   Sharman CheekStafford, Phillip, MD  metroNIDAZOLE (FLAGYL) 500 MG tablet Take 1 tablet (500 mg total) by mouth 2 (two) times daily after a meal. Patient not taking: Reported on 12/17/2016 05/22/15   Jene EveryKinner, Robert, MD  mupirocin ointment Idelle Jo(BACTROBAN) 2 % Apply to affected area 3 times daily Patient not taking: Reported on 12/17/2016 01/19/16    Menshew, Charlesetta IvoryJenise V Bacon, PA-C     Social History: She  reports that she has never smoked. She has never used smokeless tobacco. She reports that she does not drink alcohol or use drugs.  Family History: family history is not on file.  no history of gyn cancers  Review of Systems: A full review of systems was performed and negative except as noted in the HPI.     O:  BP 138/65 (BP Location: Left Arm)   Pulse (!) 106   Temp 98.7 F (37.1 C) (Oral)   Resp 18   Ht 5\' 7"  (1.702 m)   Wt (!) 315 lb (142.9 kg)   LMP 07/06/2016 (Exact Date)   BMI 49.34 kg/m  No results found for this or any previous visit (from the past 48 hour(s)).   Constitutional: NAD, AAOx3  HE/ENT: extraocular movements grossly intact, moist mucous membranes CV: RRR PULM: nl respiratory effort, CTABL     Abd: gravid, non-tender, non-distended, soft      Ext: Non-tender, Nonedmeatous   Psych: mood appropriate, speech normal Pelvic by RN . Internal os closed  NST: reactive  No CTX    A/P: 21 y.o. 2111w3d here for antenatal surveillance for low back pain . Cervical exam   Labor: not present.   Fetal Wellbeing: Reassuring Cat 1 tracing.  Reactive NST   D/c home stable, precautions reviewed, follow-up as scheduled.   ----- Maisie Fushomas  Cloyde Reams , MD Attending Obstetrician and Gynecologist [facility], Department of OB/GYN [facility]

## 2017-04-12 ENCOUNTER — Inpatient Hospital Stay
Admission: RE | Admit: 2017-04-12 | Discharge: 2017-04-15 | DRG: 774 | Disposition: A | Discharge status: Home / Self Care | Payer: Medicaid Other | Source: Ambulatory Visit | Attending: Obstetrics and Gynecology | Admitting: Obstetrics and Gynecology

## 2017-04-12 DIAGNOSIS — Z8249 Family history of ischemic heart disease and other diseases of the circulatory system: Secondary | ICD-10-CM | POA: Diagnosis not present

## 2017-04-12 DIAGNOSIS — A6000 Herpesviral infection of urogenital system, unspecified: Secondary | ICD-10-CM | POA: Diagnosis present

## 2017-04-12 DIAGNOSIS — O99214 Obesity complicating childbirth: Secondary | ICD-10-CM | POA: Diagnosis present

## 2017-04-12 DIAGNOSIS — O99824 Streptococcus B carrier state complicating childbirth: Secondary | ICD-10-CM | POA: Diagnosis present

## 2017-04-12 DIAGNOSIS — O9832 Other infections with a predominantly sexual mode of transmission complicating childbirth: Secondary | ICD-10-CM | POA: Diagnosis present

## 2017-04-12 DIAGNOSIS — Z68.41 Body mass index (BMI) pediatric, 85th percentile to less than 95th percentile for age: Secondary | ICD-10-CM

## 2017-04-12 DIAGNOSIS — O1002 Pre-existing essential hypertension complicating childbirth: Secondary | ICD-10-CM | POA: Diagnosis present

## 2017-04-12 DIAGNOSIS — Z833 Family history of diabetes mellitus: Secondary | ICD-10-CM | POA: Diagnosis not present

## 2017-04-12 DIAGNOSIS — Z3A40 40 weeks gestation of pregnancy: Secondary | ICD-10-CM | POA: Diagnosis not present

## 2017-04-12 LAB — CBC
HCT: 35.2 % (ref 35.0–47.0)
Hemoglobin: 11.3 g/dL — ABNORMAL LOW (ref 12.0–16.0)
MCH: 26.1 pg (ref 26.0–34.0)
MCHC: 32 g/dL (ref 32.0–36.0)
MCV: 81.6 fL (ref 80.0–100.0)
Platelets: 246 10*3/uL (ref 150–440)
RBC: 4.32 MIL/uL (ref 3.80–5.20)
RDW: 15.3 % — ABNORMAL HIGH (ref 11.5–14.5)
WBC: 11.1 10*3/uL — ABNORMAL HIGH (ref 3.6–11.0)

## 2017-04-12 LAB — TYPE AND SCREEN
ABO/RH(D): A POS
Antibody Screen: NEGATIVE

## 2017-04-12 MED ORDER — MISOPROSTOL 25 MCG QUARTER TABLET
25.0000 ug | ORAL_TABLET | ORAL | Status: DC | PRN
  Administered 2017-04-13 (×2): 25 ug via VAGINAL
  Filled 2017-04-12 (×4): qty 1

## 2017-04-12 MED ORDER — ONDANSETRON HCL 4 MG/2ML IJ SOLN
4.0000 mg | Freq: Four times a day (QID) | INTRAMUSCULAR | Status: DC | PRN
  Administered 2017-04-13 – 2017-04-14 (×3): 4 mg via INTRAVENOUS
  Filled 2017-04-12 (×3): qty 2

## 2017-04-12 MED ORDER — AMMONIA AROMATIC IN INHA
RESPIRATORY_TRACT | Status: AC
  Filled 2017-04-12: qty 10

## 2017-04-12 MED ORDER — LIDOCAINE HCL (PF) 1 % IJ SOLN
INTRAMUSCULAR | Status: AC
  Filled 2017-04-12: qty 30

## 2017-04-12 MED ORDER — TERBUTALINE SULFATE 1 MG/ML IJ SOLN: 0.2500 mg | Freq: Once | INTRAMUSCULAR | Status: DC | PRN

## 2017-04-12 MED ORDER — ACETAMINOPHEN 325 MG PO TABS: 650.0000 mg | ORAL_TABLET | ORAL | Status: DC | PRN

## 2017-04-12 MED ORDER — OXYTOCIN BOLUS FROM INFUSION: 500.0000 mL | Freq: Once | INTRAVENOUS | Status: DC

## 2017-04-12 MED ORDER — OXYTOCIN 10 UNIT/ML IJ SOLN
INTRAMUSCULAR | Status: AC
  Filled 2017-04-12: qty 2

## 2017-04-12 MED ORDER — OXYCODONE-ACETAMINOPHEN 5-325 MG PO TABS: 1.0000 | ORAL_TABLET | ORAL | Status: DC | PRN

## 2017-04-12 MED ORDER — MISOPROSTOL 200 MCG PO TABS
ORAL_TABLET | ORAL | Status: AC
  Administered 2017-04-13: 25 ug via VAGINAL
  Filled 2017-04-12: qty 4

## 2017-04-12 MED ORDER — LIDOCAINE HCL (PF) 1 % IJ SOLN: 30.0000 mL | INTRAMUSCULAR | Status: DC | PRN

## 2017-04-12 MED ORDER — LACTATED RINGERS IV SOLN
INTRAVENOUS | Status: DC
  Administered 2017-04-12 – 2017-04-13 (×2): via INTRAVENOUS

## 2017-04-12 MED ORDER — OXYCODONE-ACETAMINOPHEN 5-325 MG PO TABS: 2.0000 | ORAL_TABLET | ORAL | Status: DC | PRN

## 2017-04-12 MED ORDER — BUTORPHANOL TARTRATE 1 MG/ML IJ SOLN: 1.0000 mg | INTRAMUSCULAR | Status: DC | PRN

## 2017-04-12 MED ORDER — SOD CITRATE-CITRIC ACID 500-334 MG/5ML PO SOLN
30.0000 mL | ORAL | Status: DC | PRN
  Filled 2017-04-12: qty 30

## 2017-04-12 MED ORDER — OXYTOCIN 40 UNITS IN LACTATED RINGERS INFUSION - SIMPLE MED: 2.5000 [IU]/h | INTRAVENOUS | Status: DC

## 2017-04-12 MED ORDER — AMPICILLIN SODIUM 2 G IJ SOLR
2.0000 g | Freq: Once | INTRAMUSCULAR | Status: AC
  Administered 2017-04-12: 2 g via INTRAVENOUS
  Filled 2017-04-12 (×2): qty 2000

## 2017-04-12 MED ORDER — LACTATED RINGERS IV SOLN: 500.0000 mL | INTRAVENOUS | Status: DC | PRN

## 2017-04-12 NOTE — Progress Notes (Signed)
Verbal order to began Cytotec induction after 2nd dose of Ampicillin.

## 2017-04-12 NOTE — Progress Notes (Signed)
Discussed with patient her GBS positive status and the treatment course while she is here. While asking admission questions patient stated she was allergic to penicillins. Nurse asked patient her reaction and stated, "I think I have a serve reaction is what my mom told me when I was younger". Once nurse asked patient about given her other options than penicillins, she stated: "Well, I am not truly sure what happens with penicillin. I tell all my doctors about the allergy. Let me call my mom". Patient then called mom and together they verified that patient has no allergies to penicillin. There was a misunderstanding from the patient's side. Patient agrees to using penicillin.

## 2017-04-12 NOTE — H&P (Signed)
Renee RamusBianca I Kenley is a 21 y.o. female at 40+0 weeks admitted for induction for bMI 7450 and CHTN  . Pt now admits she is not allergic to PCN. 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 No symptoms of HSV outbreak OB History    Gravida Para Term Preterm AB Living   2   0   1     SAB TAB Ectopic Multiple Live Births                 Past Medical History:  Diagnosis Date  . Asthma    no hospitalizations, no recent attacks (since childhood)   Past Surgical History:  Procedure Laterality Date  . THERAPEUTIC ABORTION     Family History: family history includes Diabetes in her paternal grandfather; Hypertension in her father and mother. Social History:  reports that she has never smoked. She has never used smokeless tobacco. She reports that she does not drink alcohol or use drugs.     Maternal Diabetes:neg Genetic Screening: afp nl Maternal Ultrasounds/Referrals: normal Fetal Ultrasounds or other Referrals:   Maternal Substance Abuse:  None Significant Maternal Medications: none Significant Maternal Lab Results:  N/a Other Comments:  ROS History   Blood pressure 138/84, pulse (!) 107, temperature 98.4 F (36.9 C), temperature source Oral, resp. rate 18, height 5\' 7"  (1.702 m), weight (!) 146.5 kg (323 lb), last menstrual period 07/06/2016. Exam Physical Exam  Prenatal labs: ABO, Rh: --/--/A POS (12/02 2316) Antibody: NEG (12/02 2316) Rubella:   imm, varicella imm RPR:   neg HBsAg:  neg  HIV:   neg GBS:   +  Assessment/Plan: 40+0 weeks  Morbid obesity 50 BMI CHTN , bp nl  Pt states no PCN allergy Start Ampicillin 2 gm q 4 hrs  Start cytotec     Ihor Austinhomas J Nerida Boivin 04/12/2017, 9:24 PM

## 2017-04-12 NOTE — OB Triage Note (Signed)
Patient came in for her scheduled induction of labor. Patient has no complaints. Patient orientated to room and monitors applied.

## 2017-04-13 ENCOUNTER — Inpatient Hospital Stay: Payer: Medicaid Other | Admitting: Anesthesiology

## 2017-04-13 ENCOUNTER — Encounter: Payer: Self-pay | Admitting: Anesthesiology

## 2017-04-13 LAB — COMPREHENSIVE METABOLIC PANEL
ALT: 16 U/L (ref 14–54)
AST: 28 U/L (ref 15–41)
Albumin: 3.4 g/dL — ABNORMAL LOW (ref 3.5–5.0)
Alkaline Phosphatase: 126 U/L (ref 38–126)
Anion gap: 11 (ref 5–15)
BUN: 8 mg/dL (ref 6–20)
CO2: 19 mmol/L — ABNORMAL LOW (ref 22–32)
Calcium: 9.1 mg/dL (ref 8.9–10.3)
Chloride: 106 mmol/L (ref 101–111)
Creatinine, Ser: 0.61 mg/dL (ref 0.44–1.00)
GFR calc Af Amer: >60 mL/min (ref >60)
GFR calc non Af Amer: >60 mL/min (ref >60)
Glucose, Bld: 74 mg/dL (ref 65–99)
Potassium: 3.9 mmol/L (ref 3.5–5.1)
Sodium: 136 mmol/L (ref 135–145)
Total Bilirubin: 0.9 mg/dL (ref 0.3–1.2)
Total Protein: 7 g/dL (ref 6.5–8.1)

## 2017-04-13 LAB — PROTEIN / CREATININE RATIO, URINE
Creatinine, Urine: 85 mg/dL
Protein Creatinine Ratio: 0.14 mg/mg{Cre} (ref 0.00–0.15)
Total Protein, Urine: 12 mg/dL

## 2017-04-13 LAB — CBC
HCT: 35.3 % (ref 35.0–47.0)
Hemoglobin: 11.8 g/dL — ABNORMAL LOW (ref 12.0–16.0)
MCH: 26.4 pg (ref 26.0–34.0)
MCHC: 33.5 g/dL (ref 32.0–36.0)
MCV: 78.9 fL — ABNORMAL LOW (ref 80.0–100.0)
Platelets: 226 10*3/uL (ref 150–440)
RBC: 4.47 MIL/uL (ref 3.80–5.20)
RDW: 15.4 % — ABNORMAL HIGH (ref 11.5–14.5)
WBC: 13.9 10*3/uL — ABNORMAL HIGH (ref 3.6–11.0)

## 2017-04-13 LAB — CHLAMYDIA/NGC RT PCR (ARMC ONLY)
Chlamydia Tr: NOT DETECTED
N gonorrhoeae: NOT DETECTED

## 2017-04-13 MED ORDER — LABETALOL HCL 5 MG/ML IV SOLN
INTRAVENOUS | Status: AC
  Administered 2017-04-13: 20 mg via INTRAVENOUS
  Filled 2017-04-13: qty 4

## 2017-04-13 MED ORDER — BUPIVACAINE HCL (PF) 0.25 % IJ SOLN
INTRAMUSCULAR | Status: DC | PRN
  Administered 2017-04-13: 5 mL via EPIDURAL

## 2017-04-13 MED ORDER — DIPHENHYDRAMINE HCL 50 MG/ML IJ SOLN: 12.5000 mg | INTRAMUSCULAR | Status: DC | PRN

## 2017-04-13 MED ORDER — OXYTOCIN 40 UNITS IN LACTATED RINGERS INFUSION - SIMPLE MED
1.0000 m[IU]/min | INTRAVENOUS | Status: DC
  Administered 2017-04-13: 1 m[IU]/min via INTRAVENOUS
  Filled 2017-04-13: qty 1000

## 2017-04-13 MED ORDER — SODIUM CHLORIDE 0.9 % IV SOLN
2.0000 g | INTRAVENOUS | Status: DC
  Administered 2017-04-13 – 2017-04-14 (×7): 2 g via INTRAVENOUS
  Filled 2017-04-13 (×11): qty 2000

## 2017-04-13 MED ORDER — EPHEDRINE 5 MG/ML INJ
10.0000 mg | INTRAVENOUS | Status: DC | PRN
  Filled 2017-04-13: qty 2

## 2017-04-13 MED ORDER — SODIUM CHLORIDE 0.9 % IV SOLN
INTRAVENOUS | Status: AC
  Administered 2017-04-13: 2 g via INTRAVENOUS
  Filled 2017-04-13: qty 2000

## 2017-04-13 MED ORDER — LABETALOL HCL 5 MG/ML IV SOLN
20.0000 mg | INTRAVENOUS | Status: DC | PRN
  Administered 2017-04-13 – 2017-04-14 (×2): 20 mg via INTRAVENOUS
  Filled 2017-04-13 (×2): qty 4

## 2017-04-13 MED ORDER — SODIUM CHLORIDE FLUSH 0.9 % IV SOLN
INTRAVENOUS | Status: AC
  Filled 2017-04-13: qty 10

## 2017-04-13 MED ORDER — FENTANYL 2.5 MCG/ML W/ROPIVACAINE 0.2% IN NS 100 ML EPIDURAL INFUSION (ARMC-ANES)
EPIDURAL | Status: AC
  Filled 2017-04-13: qty 100

## 2017-04-13 MED ORDER — PHENYLEPHRINE 40 MCG/ML (10ML) SYRINGE FOR IV PUSH (FOR BLOOD PRESSURE SUPPORT)
80.0000 ug | PREFILLED_SYRINGE | INTRAVENOUS | Status: DC | PRN
  Filled 2017-04-13: qty 5

## 2017-04-13 MED ORDER — TERBUTALINE SULFATE 1 MG/ML IJ SOLN: 0.2500 mg | Freq: Once | INTRAMUSCULAR | Status: DC | PRN

## 2017-04-13 MED ORDER — LACTATED RINGERS IV SOLN: 500.0000 mL | Freq: Once | INTRAVENOUS | Status: DC

## 2017-04-13 MED ORDER — FENTANYL 2.5 MCG/ML W/ROPIVACAINE 0.2% IN NS 100 ML EPIDURAL INFUSION (ARMC-ANES)
10.0000 mL/h | EPIDURAL | Status: DC
  Administered 2017-04-13: 10 mL/h via EPIDURAL

## 2017-04-13 NOTE — Anesthesia Procedure Notes (Signed)
Epidural Patient location during procedure: OB  Staffing Anesthesiologist: Corrine Tillis Performed: anesthesiologist   Preanesthetic Checklist Completed: patient identified, site marked, surgical consent, pre-op evaluation, timeout performed, IV checked, risks and benefits discussed and monitors and equipment checked  Epidural Patient position: sitting Prep: Betadine Patient monitoring: heart rate, continuous pulse ox and blood pressure Approach: midline Location: L4-L5 Injection technique: LOR saline  Needle:  Needle type: Tuohy  Needle gauge: 18 G Needle length: 9 cm and 9 Catheter type: closed end flexible Catheter size: 20 Guage Test dose: negative and 1.5% lidocaine with Epi 1:200 K  Assessment Sensory level: T10 Events: blood not aspirated, injection not painful, no injection resistance, negative IV test and no paresthesia  Additional Notes   Patient tolerated the insertion well without complications.Reason for block:procedure for pain     

## 2017-04-13 NOTE — Progress Notes (Signed)
Patient ID: Tiffany RamusBianca I Sanchez, female   DOB: 09/11/1996, 20 y.o.   MRN: [id number] Still trying to get blood draw on pt . Pitocin ongoing  BP lower s/p one dose of labetalol .  CX 3-4 /80/0  Reassuring fetal monitoring  Pt request CLE

## 2017-04-13 NOTE — Anesthesia Preprocedure Evaluation (Signed)
Anesthesia Evaluation  Patient identified by MRN, date of birth, ID band Patient awake    Reviewed: Allergy & Precautions, NPO status , Patient's Chart, lab work & pertinent test results, reviewed documented beta blocker date and time   Airway Mallampati: III  TM Distance: >3 FB     Dental  (+) Chipped   Pulmonary shortness of breath, asthma ,           Cardiovascular hypertension, Pt. on medications      Neuro/Psych    GI/Hepatic   Endo/Other  Morbid obesity  Renal/GU      Musculoskeletal   Abdominal   Peds  Hematology   Anesthesia Other Findings   Reproductive/Obstetrics                             Anesthesia Physical Anesthesia Plan  ASA: III  Anesthesia Plan: Epidural   Post-op Pain Management:    Induction:   Airway Management Planned:   Additional Equipment:   Intra-op Plan:   Post-operative Plan:   Informed Consent: I have reviewed the patients History and Physical, chart, labs and discussed the procedure including the risks, benefits and alternatives for the proposed anesthesia with the patient or authorized representative who has indicated his/her understanding and acceptance.     Plan Discussed with: CRNA  Anesthesia Plan Comments:         Anesthesia Quick Evaluation

## 2017-04-13 NOTE — Progress Notes (Signed)
Lab phlebotomist was unable to draw lab for CBC and CMP. Nurse called nursing supervisor at 1946 to ask for help with getting labs. Nurse supervisor said she will get someone to help as soon as possible.

## 2017-04-13 NOTE — Progress Notes (Signed)
Tiffany Sanchez is a 21 y.o. G2P0010 at 7176w1d Induction day #2  Subjective: Some painful ctx overnight  cytotec x 2   Objective: BP 138/72   Pulse 77   Temp 98.7 F (37.1 C) (Oral)   Resp 16   Ht 5\' 7"  (1.702 m)   Wt (!) 146.5 kg (323 lb)   LMP 07/06/2016 (Exact Date)   BMI 50.59 kg/m  No intake/output data recorded. No intake/output data recorded.  FHT: 130 fhr  + accels , no decles , CTX q 5 :  SVE:   Dilation: 1 Effacement (%): 60 Station: -3 Exam by:: ER RN  Exam by TJS : at 1230 : 1cm / 60 % / -2  AROM clear  Labs: Lab Results  Component Value Date   WBC 11.1 (H) 04/12/2017   HGB 11.3 (L) 04/12/2017   HCT 35.2 04/12/2017   MCV 81.6 04/12/2017   PLT 246 04/12/2017    Assessment / Plan: Induction obesity / htn  Reassuring fetal monitoring .  Start Pitocin augmentation  Stadol and offered cle later  Continue abx for GBS  Tiffany Sanchez 04/13/2017, 12:33 PM

## 2017-04-13 NOTE — Progress Notes (Signed)
Tiffany Sanchez is a 21 y.o. G2P0010 on Pitocin  CTX pattern adequate  Subjective:  Painful CTX on N2O2 BP elevated  Objective: BP 174/101 BP (!) 148/93   Pulse 94   Temp 98.2 F (36.8 C) (Oral)   Resp 18   Ht 5\' 7"  (1.702 m)   Wt (!) 146.5 kg (323 lb)   LMP 07/06/2016 (Exact Date)   BMI 50.59 kg/m  I/O last 3 completed shifts: In: 360 [P.O.:360] Out: -  No intake/output data recorded.  FHT: fhr 120-130 + accels , reassuring  UC:   q 2 min SVE:   Dilation: 3 Effacement (%): 80 Station: -2 Exam by:: ER RN   Labs: Lab Results  Component Value Date   WBC 11.1 (H) 04/12/2017   HGB 11.3 (L) 04/12/2017   HCT 35.2 04/12/2017   MCV 81.6 04/12/2017   PLT 246 04/12/2017    Assessment / Plan: Reassuring fetal monitoring , cont pitocin augmentation  Elevated BP - check preeclampsia labs  Start labetalol 20 mg q 2 hrs to keep SBP ,160 and or DBP < 110  Thomas J Schermerhorn 04/13/2017, 7:05 PM

## 2017-04-14 ENCOUNTER — Encounter: Payer: Self-pay | Admitting: *Deleted

## 2017-04-14 LAB — CBC
HCT: 31.8 % — ABNORMAL LOW (ref 35.0–47.0)
Hemoglobin: 10.5 g/dL — ABNORMAL LOW (ref 12.0–16.0)
MCH: 26.5 pg (ref 26.0–34.0)
MCHC: 33 g/dL (ref 32.0–36.0)
MCV: 80.5 fL (ref 80.0–100.0)
Platelets: 231 10*3/uL (ref 150–440)
RBC: 3.95 MIL/uL (ref 3.80–5.20)
RDW: 15.3 % — ABNORMAL HIGH (ref 11.5–14.5)
WBC: 14.2 10*3/uL — ABNORMAL HIGH (ref 3.6–11.0)

## 2017-04-14 LAB — RPR: RPR Ser Ql: NONREACTIVE

## 2017-04-14 MED ORDER — DIPHENHYDRAMINE HCL 25 MG PO CAPS: 25.0000 mg | ORAL_CAPSULE | Freq: Four times a day (QID) | ORAL | Status: DC | PRN

## 2017-04-14 MED ORDER — SIMETHICONE 80 MG PO CHEW: 80.0000 mg | CHEWABLE_TABLET | ORAL | Status: DC | PRN

## 2017-04-14 MED ORDER — ONDANSETRON HCL 4 MG PO TABS: 4.0000 mg | ORAL_TABLET | ORAL | Status: DC | PRN

## 2017-04-14 MED ORDER — IBUPROFEN 600 MG PO TABS
600.0000 mg | ORAL_TABLET | Freq: Four times a day (QID) | ORAL | Status: DC
  Administered 2017-04-14 – 2017-04-15 (×4): 600 mg via ORAL
  Filled 2017-04-14 (×4): qty 1

## 2017-04-14 MED ORDER — FERROUS SULFATE 325 (65 FE) MG PO TABS
325.0000 mg | ORAL_TABLET | Freq: Two times a day (BID) | ORAL | Status: DC
  Administered 2017-04-14 – 2017-04-15 (×2): 325 mg via ORAL
  Filled 2017-04-14 (×2): qty 1

## 2017-04-14 MED ORDER — ACETAMINOPHEN 325 MG PO TABS: 650.0000 mg | ORAL_TABLET | ORAL | Status: DC | PRN

## 2017-04-14 MED ORDER — WITCH HAZEL-GLYCERIN EX PADS: 1.0000 "application " | MEDICATED_PAD | CUTANEOUS | Status: DC | PRN

## 2017-04-14 MED ORDER — SODIUM CHLORIDE FLUSH 0.9 % IV SOLN
INTRAVENOUS | Status: AC
  Filled 2017-04-14: qty 20

## 2017-04-14 MED ORDER — SENNOSIDES-DOCUSATE SODIUM 8.6-50 MG PO TABS
2.0000 | ORAL_TABLET | ORAL | Status: DC
  Administered 2017-04-14: 2 via ORAL
  Filled 2017-04-14: qty 2

## 2017-04-14 MED ORDER — COCONUT OIL OIL: 1.0000 "application " | TOPICAL_OIL | Status: DC | PRN

## 2017-04-14 MED ORDER — DIBUCAINE 1 % RE OINT: 1.0000 "application " | TOPICAL_OINTMENT | RECTAL | Status: DC | PRN

## 2017-04-14 MED ORDER — OXYCODONE-ACETAMINOPHEN 5-325 MG PO TABS
1.0000 | ORAL_TABLET | ORAL | Status: DC | PRN
  Administered 2017-04-14: 1 via ORAL
  Filled 2017-04-14: qty 1

## 2017-04-14 MED ORDER — BENZOCAINE-MENTHOL 20-0.5 % EX AERO
1.0000 "application " | INHALATION_SPRAY | CUTANEOUS | Status: DC | PRN
  Filled 2017-04-14: qty 56

## 2017-04-14 MED ORDER — OXYCODONE-ACETAMINOPHEN 5-325 MG PO TABS
2.0000 | ORAL_TABLET | ORAL | Status: DC | PRN
  Administered 2017-04-14 – 2017-04-15 (×2): 2 via ORAL
  Filled 2017-04-14 (×2): qty 2

## 2017-04-14 MED ORDER — IBUPROFEN 600 MG PO TABS
600.0000 mg | ORAL_TABLET | Freq: Four times a day (QID) | ORAL | Status: DC
  Administered 2017-04-14: 600 mg via ORAL
  Filled 2017-04-14: qty 1

## 2017-04-14 MED ORDER — ZOLPIDEM TARTRATE 5 MG PO TABS: 5.0000 mg | ORAL_TABLET | Freq: Every evening | ORAL | Status: DC | PRN

## 2017-04-14 MED ORDER — MAGNESIUM HYDROXIDE 400 MG/5ML PO SUSP: 30.0000 mL | ORAL | Status: DC | PRN

## 2017-04-14 MED ORDER — ONDANSETRON HCL 4 MG/2ML IJ SOLN: 4.0000 mg | INTRAMUSCULAR | Status: DC | PRN

## 2017-04-14 MED ORDER — PRENATAL MULTIVITAMIN CH
1.0000 | ORAL_TABLET | Freq: Every day | ORAL | Status: DC
  Administered 2017-04-14 – 2017-04-15 (×2): 1 via ORAL
  Filled 2017-04-14 (×2): qty 1

## 2017-04-14 MED ORDER — MEASLES, MUMPS & RUBELLA VAC ~~LOC~~ INJ
0.5000 mL | INJECTION | Freq: Once | SUBCUTANEOUS | Status: AC
  Administered 2017-04-15: 0.5 mL via SUBCUTANEOUS
  Filled 2017-04-14 (×2): qty 0.5

## 2017-04-14 NOTE — Anesthesia Postprocedure Evaluation (Signed)
Anesthesia Post Note  Patient: Tiffany RamusBianca I Sanchez  Procedure(s) Performed: * No procedures listed *  Patient location during evaluation: Mother Baby Anesthesia Type: Epidural Level of consciousness: awake and alert Pain management: pain level controlled Vital Signs Assessment: post-procedure vital signs reviewed and stable Respiratory status: spontaneous breathing, nonlabored ventilation and respiratory function stable Cardiovascular status: stable Postop Assessment: no headache, no backache and epidural receding Anesthetic complications: no     Last Vitals:  Vitals:   04/14/17 0633 04/14/17 0927  BP: (!) 149/60 120/69  Pulse: (!) 123 (!) 121  Resp:  18  Temp:  37.2 C    Last Pain:  Vitals:   04/14/17 1103  TempSrc:   PainSc: 7                  Dasiah Hooley S

## 2017-04-14 NOTE — Discharge Summary (Signed)
  Obstetric Discharge Summary   Patient ID: Patient Name: Tiffany Sanchez Taussig DOB: 04/18/1996 MRN: [id number]  Date of Admission: 04/12/2017 Date of Discharge: 04/15/17 [facility] Gestational Age at Delivery: 8863w2d   Antepartum complications:morbid obesity and gestational HTNAdmitting Diagnosis:same  Secondary Diagnoses: Patient Active Problem List   Diagnosis Date Noted  . Gestational hypertension 04/12/2017  . Low back pain during pregnancy, third trimester 04/01/2017  . Leakage of amniotic fluid 03/15/2017  . Shortness of breath 12/28/2016  . Abdominal pain affecting pregnancy 12/17/2016    Augmentation: Pitocin and Cytotec Complications: Moderate shoulder dystocia Intrapartum complications/course:   Date of Delivery:02/12/17 at [id number] Delivered NW:[id number]:Karin Griffith MD Delivery Type: spontaneous vaginal delivery Anesthesia: epidural Placenta: sponatneous Laceration: MLE Episiotomy: none  Newborn Data: Live born unspecified sex  Birth Weight: 8 lb 4.3 oz (3750 g) APGAR: , 7/9       Postpartum Course  Patient had an uncomplicated postpartum course.  By time of discharge on PPD#1 her pain was controlled on oral pain medications; she had appropriate lochia and was ambulating, voiding without difficulty and tolerating regular diet.  She was deemed stable for discharge to home.     Labs: CBC Latest Ref Rng & Units 04/15/2017 04/14/2017 04/13/2017  WBC 3.6 - 11.0 K/uL 9.6 14.2(H) 13.9(H)  Hemoglobin 12.0 - 16.0 g/dL 8.4(O8.4(L) 10.5(L) 11.8(L)  Hematocrit 35.0 - 47.0 % 25.1(L) 31.8(L) 35.3  Platelets 150 - 440 K/uL 185 231 226   A POS  Physical exam:  BP 127/79 (BP Location: Left Arm)   Pulse 100   Temp 98.7 F (37.1 C) (Oral)   Resp 20   Ht 5\' 7"  (1.702 m)   Wt (!) 146.5 kg (323 lb)   LMP 07/06/2016 (Exact Date)   SpO2 100%   Breastfeeding? Unknown   BMI 50.59 kg/m  General: alert and no distress Pulm: normal respiratory effort Lochia:  appropriate Abdomen: soft, NT Uterine Fundus: firm, below umbilicus Extremities: No evidence of DVT seen on physical exam. No lower extremity edema.   Disposition: stable, discharge to home Baby Feeding: breastmilk Baby Disposition: home with mom  Contraception: desires IUD at 6 week appt  Prenatal Labs:     Plan:  Tiffany Sanchez Birchler was discharged to home in good condition. [facility]   Discharge Instructions: Per After Visit Summary. Activity: Advance as tolerated. Pelvic rest for 6 weeks.  Refer to After Visit Summary Diet: Regular Discharge Medications:  Outpatient follow up: 6 week PP exam    Signed:  Ihor Austinhomas J Henretter Piekarski  04/15/17

## 2017-04-15 LAB — CBC WITH DIFFERENTIAL/PLATELET
Basophils Absolute: 0 10*3/uL (ref 0–0.1)
Basophils Relative: 1 %
Eosinophils Absolute: 0.2 10*3/uL (ref 0–0.7)
Eosinophils Relative: 2 %
HCT: 25.1 % — ABNORMAL LOW (ref 35.0–47.0)
Hemoglobin: 8.4 g/dL — ABNORMAL LOW (ref 12.0–16.0)
Lymphocytes Relative: 22 %
Lymphs Abs: 2.1 10*3/uL (ref 1.0–3.6)
MCH: 26.7 pg (ref 26.0–34.0)
MCHC: 33.3 g/dL (ref 32.0–36.0)
MCV: 80.2 fL (ref 80.0–100.0)
Monocytes Absolute: 0.7 10*3/uL (ref 0.2–0.9)
Monocytes Relative: 7 %
Neutro Abs: 6.6 10*3/uL — ABNORMAL HIGH (ref 1.4–6.5)
Neutrophils Relative %: 68 %
Platelets: 185 10*3/uL (ref 150–440)
RBC: 3.14 MIL/uL — ABNORMAL LOW (ref 3.80–5.20)
RDW: 15.7 % — ABNORMAL HIGH (ref 11.5–14.5)
WBC: 9.6 10*3/uL (ref 3.6–11.0)

## 2017-04-15 MED ORDER — IBUPROFEN 600 MG PO TABS: 600.0000 mg | ORAL_TABLET | Freq: Four times a day (QID) | ORAL | 0 refills | Status: DC

## 2017-04-15 MED ORDER — BENZOCAINE-MENTHOL 20-0.5 % EX AERO: 1.0000 "application " | INHALATION_SPRAY | CUTANEOUS | 1 refills | Status: DC | PRN

## 2017-04-15 NOTE — Progress Notes (Signed)
Reviewed all patients discharge instructions and handouts regarding postpartum bleeding, no intercourse for 6 weeks, signs and symptoms of mastitis and postpartum bleu's. Reviewed discharge instructions for newborn regarding proper cord care, how and when to bathe the newborn, nail care, proper way to take the baby's temperature, along with safe sleep. All questions have been answered at this time. Patient discharged via wheelchair with nurse tech.  

## 2017-09-07 ENCOUNTER — Emergency Department
Admission: EM | Admit: 2017-09-07 | Discharge: 2017-09-07 | Disposition: A | Discharge status: Home / Self Care | Payer: No Typology Code available for payment source | Attending: Emergency Medicine | Admitting: Emergency Medicine

## 2017-09-07 DIAGNOSIS — Y92410 Unspecified street and highway as the place of occurrence of the external cause: Secondary | ICD-10-CM | POA: Insufficient documentation

## 2017-09-07 DIAGNOSIS — Y998 Other external cause status: Secondary | ICD-10-CM | POA: Diagnosis not present

## 2017-09-07 DIAGNOSIS — Y9389 Activity, other specified: Secondary | ICD-10-CM | POA: Diagnosis not present

## 2017-09-07 DIAGNOSIS — Z79899 Other long term (current) drug therapy: Secondary | ICD-10-CM | POA: Diagnosis not present

## 2017-09-07 DIAGNOSIS — J45909 Unspecified asthma, uncomplicated: Secondary | ICD-10-CM | POA: Insufficient documentation

## 2017-09-07 DIAGNOSIS — S161XXA Strain of muscle, fascia and tendon at neck level, initial encounter: Secondary | ICD-10-CM | POA: Diagnosis not present

## 2017-09-07 DIAGNOSIS — S199XXA Unspecified injury of neck, initial encounter: Secondary | ICD-10-CM | POA: Diagnosis present

## 2017-09-07 DIAGNOSIS — M7918 Myalgia, other site: Secondary | ICD-10-CM | POA: Diagnosis not present

## 2017-09-07 MED ORDER — TRAMADOL HCL 50 MG PO TABS: 50.0000 mg | ORAL_TABLET | Freq: Four times a day (QID) | ORAL | 0 refills | Status: DC | PRN

## 2017-09-07 MED ORDER — CYCLOBENZAPRINE HCL 10 MG PO TABS: 10.0000 mg | ORAL_TABLET | Freq: Three times a day (TID) | ORAL | 0 refills | Status: DC | PRN

## 2017-09-07 MED ORDER — IBUPROFEN 800 MG PO TABS
800.0000 mg | ORAL_TABLET | Freq: Once | ORAL | Status: AC
  Administered 2017-09-07: 800 mg via ORAL
  Filled 2017-09-07: qty 1

## 2017-09-07 MED ORDER — TRAMADOL HCL 50 MG PO TABS
50.0000 mg | ORAL_TABLET | Freq: Once | ORAL | Status: AC
  Administered 2017-09-07: 50 mg via ORAL
  Filled 2017-09-07: qty 1

## 2017-09-07 MED ORDER — IBUPROFEN 800 MG PO TABS: 800.0000 mg | ORAL_TABLET | Freq: Three times a day (TID) | ORAL | 0 refills | Status: DC | PRN

## 2017-09-07 MED ORDER — CYCLOBENZAPRINE HCL 10 MG PO TABS
10.0000 mg | ORAL_TABLET | Freq: Once | ORAL | Status: AC
  Administered 2017-09-07: 10 mg via ORAL
  Filled 2017-09-07: qty 1
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
## 2017-11-21 ENCOUNTER — Telehealth: Payer: Self-pay

## 2017-11-21 NOTE — Telephone Encounter (Signed)
Voicemail message left- need to change appt time from 3PM to 915. Requested a call back

## 2017-11-22 ENCOUNTER — Encounter: Payer: Self-pay | Admitting: Certified Nurse Midwife

## 2017-11-22 ENCOUNTER — Encounter: Payer: Self-pay | Admitting: Obstetrics and Gynecology

## 2017-12-13 ENCOUNTER — Encounter (HOSPITAL_COMMUNITY): Payer: Self-pay | Admitting: *Deleted

## 2017-12-13 ENCOUNTER — Other Ambulatory Visit: Payer: Self-pay

## 2017-12-13 ENCOUNTER — Inpatient Hospital Stay (HOSPITAL_COMMUNITY): Payer: Medicaid Other

## 2017-12-13 ENCOUNTER — Inpatient Hospital Stay (HOSPITAL_COMMUNITY)
Admission: AD | Admit: 2017-12-13 | Discharge: 2017-12-13 | Disposition: A | Discharge status: Home / Self Care | Payer: Medicaid Other | Source: Ambulatory Visit | Attending: Obstetrics and Gynecology | Admitting: Obstetrics and Gynecology

## 2017-12-13 DIAGNOSIS — R109 Unspecified abdominal pain: Secondary | ICD-10-CM | POA: Insufficient documentation

## 2017-12-13 DIAGNOSIS — Z3A10 10 weeks gestation of pregnancy: Secondary | ICD-10-CM | POA: Diagnosis not present

## 2017-12-13 DIAGNOSIS — Z880 Allergy status to penicillin: Secondary | ICD-10-CM | POA: Insufficient documentation

## 2017-12-13 DIAGNOSIS — O26891 Other specified pregnancy related conditions, first trimester: Secondary | ICD-10-CM | POA: Insufficient documentation

## 2017-12-13 DIAGNOSIS — Z3490 Encounter for supervision of normal pregnancy, unspecified, unspecified trimester: Secondary | ICD-10-CM

## 2017-12-13 DIAGNOSIS — R102 Pelvic and perineal pain: Secondary | ICD-10-CM | POA: Insufficient documentation

## 2017-12-13 DIAGNOSIS — O10919 Unspecified pre-existing hypertension complicating pregnancy, unspecified trimester: Secondary | ICD-10-CM | POA: Diagnosis present

## 2017-12-13 LAB — URINALYSIS, ROUTINE W REFLEX MICROSCOPIC
Bacteria, UA: NONE SEEN
Bilirubin Urine: NEGATIVE
Glucose, UA: NEGATIVE mg/dL
Hgb urine dipstick: NEGATIVE
Ketones, ur: NEGATIVE mg/dL
Nitrite: NEGATIVE
Protein, ur: NEGATIVE mg/dL
Specific Gravity, Urine: 1.023 (ref 1.005–1.030)
pH: 6 (ref 5.0–8.0)

## 2017-12-13 LAB — PROTEIN / CREATININE RATIO, URINE
Creatinine, Urine: 184 mg/dL
Protein Creatinine Ratio: 0.04 mg/mg{Cre} (ref 0.00–0.15)
Total Protein, Urine: 7 mg/dL

## 2017-12-13 LAB — CBC
HCT: 32.3 % — ABNORMAL LOW (ref 36.0–46.0)
Hemoglobin: 10 g/dL — ABNORMAL LOW (ref 12.0–15.0)
MCH: 22 pg — ABNORMAL LOW (ref 26.0–34.0)
MCHC: 31 g/dL (ref 30.0–36.0)
MCV: 71.1 fL — ABNORMAL LOW (ref 78.0–100.0)
Platelets: 339 10*3/uL (ref 150–400)
RBC: 4.54 MIL/uL (ref 3.87–5.11)
RDW: 17.3 % — ABNORMAL HIGH (ref 11.5–15.5)
WBC: 12.5 10*3/uL — ABNORMAL HIGH (ref 4.0–10.5)

## 2017-12-13 LAB — HCG, QUANTITATIVE, PREGNANCY: hCG, Beta Chain, Quant, S: 41775 m[IU]/mL — ABNORMAL HIGH (ref <5)

## 2017-12-13 LAB — COMPREHENSIVE METABOLIC PANEL
ALT: 13 U/L — ABNORMAL LOW (ref 14–54)
AST: 14 U/L — ABNORMAL LOW (ref 15–41)
Albumin: 3.7 g/dL (ref 3.5–5.0)
Alkaline Phosphatase: 46 U/L (ref 38–126)
Anion gap: 9 (ref 5–15)
BUN: 13 mg/dL (ref 6–20)
CO2: 21 mmol/L — ABNORMAL LOW (ref 22–32)
Calcium: 9.5 mg/dL (ref 8.9–10.3)
Chloride: 105 mmol/L (ref 101–111)
Creatinine, Ser: 0.61 mg/dL (ref 0.44–1.00)
GFR calc Af Amer: >60 mL/min (ref >60)
GFR calc non Af Amer: >60 mL/min (ref >60)
Glucose, Bld: 83 mg/dL (ref 65–99)
Potassium: 3.8 mmol/L (ref 3.5–5.1)
Sodium: 135 mmol/L (ref 135–145)
Total Bilirubin: 0.4 mg/dL (ref 0.3–1.2)
Total Protein: 7.7 g/dL (ref 6.5–8.1)

## 2017-12-13 LAB — POCT PREGNANCY, URINE: Preg Test, Ur: POSITIVE — AB

## 2017-12-13 LAB — WET PREP, GENITAL
Clue Cells Wet Prep HPF POC: NONE SEEN
Sperm: NONE SEEN
Trich, Wet Prep: NONE SEEN
Yeast Wet Prep HPF POC: NONE SEEN

## 2017-12-13 NOTE — MAU Note (Signed)
Is pregnant.  Has just been feeling like her stomach is hurting real bad. Just had a child 7 months ago.  Didn't have any of this before. No bleeding.  Just feels bad. BP has been up.  dizzy

## 2017-12-13 NOTE — Discharge Instructions (Signed)
Safe Medications in Pregnancy   Acne: Benzoyl Peroxide Salicylic Acid  Backache/Headache: Tylenol: 2 regular strength every 4 hours OR              2 Extra strength every 6 hours  Colds/Coughs/Allergies: Benadryl (alcohol free) 25 mg every 6 hours as needed Breath right strips Claritin Cepacol throat lozenges Chloraseptic throat spray Cold-Eeze- up to three times per day Cough drops, alcohol free Flonase (by prescription only) Guaifenesin Mucinex Robitussin DM (plain only, alcohol free) Saline nasal spray/drops Sudafed (pseudoephedrine) & Actifed ** use only after [redacted] weeks gestation and if you do not have high blood pressure Tylenol Vicks Vaporub Zinc lozenges Zyrtec   Constipation: Colace Ducolax suppositories Fleet enema Glycerin suppositories Metamucil Milk of magnesia Miralax Senokot Smooth move tea  Diarrhea: Kaopectate Imodium A-D  *NO pepto Bismol  Hemorrhoids: Anusol Anusol HC Preparation H Tucks  Indigestion: Tums Maalox Mylanta Zantac  Pepcid  Insomnia: Benadryl (alcohol free) 25mg every 6 hours as needed Tylenol PM Unisom, no Gelcaps  Leg Cramps: Tums MagGel  Nausea/Vomiting:  Bonine Dramamine Emetrol Ginger extract Sea bands Meclizine  Nausea medication to take during pregnancy:  Unisom (doxylamine succinate 25 mg tablets) Take one tablet daily at bedtime. If symptoms are not adequately controlled, the dose can be increased to a maximum recommended dose of two tablets daily (1/2 tablet in the morning, 1/2 tablet mid-afternoon and one at bedtime). Vitamin B6 100mg tablets. Take one tablet twice a day (up to 200 mg per day).  Skin Rashes: Aveeno products Benadryl cream or 25mg every 6 hours as needed Calamine Lotion 1% cortisone cream  Yeast infection: Gyne-lotrimin 7 Monistat 7   **If taking multiple medications, please check labels to avoid duplicating the same active ingredients **take medication as directed on  the label ** Do not exceed 4000 mg of tylenol in 24 hours **Do not take medications that contain aspirin or ibuprofen    First Trimester of Pregnancy The first trimester of pregnancy is from week 1 until the end of week 13 (months 1 through 3). A week after a sperm fertilizes an egg, the egg will implant on the wall of the uterus. This embryo will begin to develop into a baby. Genes from you and your partner will form the baby. The female genes will determine whether the baby will be a boy or a girl. At 6-8 weeks, the eyes and face will be formed, and the heartbeat can be seen on ultrasound. At the end of 12 weeks, all the baby's organs will be formed. Now that you are pregnant, you will want to do everything you can to have a healthy baby. Two of the most important things are to get good prenatal care and to follow your health care provider's instructions. Prenatal care is all the medical care you receive before the baby's birth. This care will help prevent, find, and treat any problems during the pregnancy and childbirth. Body changes during your first trimester Your body goes through many changes during pregnancy. The changes vary from woman to woman.  You may gain or lose a couple of pounds at first.  You may feel sick to your stomach (nauseous) and you may throw up (vomit). If the vomiting is uncontrollable, call your health care provider.  You may tire easily.  You may develop headaches that can be relieved by medicines. All medicines should be approved by your health care provider.  You may urinate more often. Painful urination may mean you have a   bladder infection.  You may develop heartburn as a result of your pregnancy.  You may develop constipation because certain hormones are causing the muscles that push stool through your intestines to slow down.  You may develop hemorrhoids or swollen veins (varicose veins).  Your breasts may begin to grow larger and become tender. Your  nipples may stick out more, and the tissue that surrounds them (areola) may become darker.  Your gums may bleed and may be sensitive to brushing and flossing.  Dark spots or blotches (chloasma, mask of pregnancy) may develop on your face. This will likely fade after the baby is born.  Your menstrual periods will stop.  You may have a loss of appetite.  You may develop cravings for certain kinds of food.  You may have changes in your emotions from day to day, such as being excited to be pregnant or being concerned that something may go wrong with the pregnancy and baby.  You may have more vivid and strange dreams.  You may have changes in your hair. These can include thickening of your hair, rapid growth, and changes in texture. Some women also have hair loss during or after pregnancy, or hair that feels dry or thin. Your hair will most likely return to normal after your baby is born.  What to expect at prenatal visits During a routine prenatal visit:  You will be weighed to make sure you and the baby are growing normally.  Your blood pressure will be taken.  Your abdomen will be measured to track your baby's growth.  The fetal heartbeat will be listened to between weeks 10 and 14 of your pregnancy.  Test results from any previous visits will be discussed.  Your health care provider may ask you:  How you are feeling.  If you are feeling the baby move.  If you have had any abnormal symptoms, such as leaking fluid, bleeding, severe headaches, or abdominal cramping.  If you are using any tobacco products, including cigarettes, chewing tobacco, and electronic cigarettes.  If you have any questions.  Other tests that may be performed during your first trimester include:  Blood tests to find your blood type and to check for the presence of any previous infections. The tests will also be used to check for low iron levels (anemia) and protein on red blood cells (Rh antibodies).  Depending on your risk factors, or if you previously had diabetes during pregnancy, you may have tests to check for high blood sugar that affects pregnant women (gestational diabetes).  Urine tests to check for infections, diabetes, or protein in the urine.  An ultrasound to confirm the proper growth and development of the baby.  Fetal screens for spinal cord problems (spina bifida) and Down syndrome.  HIV (human immunodeficiency virus) testing. Routine prenatal testing includes screening for HIV, unless you choose not to have this test.  You may need other tests to make sure you and the baby are doing well.  Follow these instructions at home: Medicines  Follow your health care provider's instructions regarding medicine use. Specific medicines may be either safe or unsafe to take during pregnancy.  Take a prenatal vitamin that contains at least 600 micrograms (mcg) of folic acid.  If you develop constipation, try taking a stool softener if your health care provider approves. Eating and drinking  Eat a balanced diet that includes fresh fruits and vegetables, whole grains, good sources of protein such as meat, eggs, or tofu, and low-fat dairy.   Your health care provider will help you determine the amount of weight gain that is right for you.  Avoid raw meat and uncooked cheese. These carry germs that can cause birth defects in the baby.  Eating four or five small meals rather than three large meals a day may help relieve nausea and vomiting. If you start to feel nauseous, eating a few soda crackers can be helpful. Drinking liquids between meals, instead of during meals, also seems to help ease nausea and vomiting.  Limit foods that are high in fat and processed sugars, such as fried and sweet foods.  To prevent constipation: ? Eat foods that are high in fiber, such as fresh fruits and vegetables, whole grains, and beans. ? Drink enough fluid to keep your urine clear or pale  yellow. Activity  Exercise only as directed by your health care provider. Most women can continue their usual exercise routine during pregnancy. Try to exercise for 30 minutes at least 5 days a week. Exercising will help you: ? Control your weight. ? Stay in shape. ? Be prepared for labor and delivery.  Experiencing pain or cramping in the lower abdomen or lower back is a good sign that you should stop exercising. Check with your health care provider before continuing with normal exercises.  Try to avoid standing for long periods of time. Move your legs often if you must stand in one place for a long time.  Avoid heavy lifting.  Wear low-heeled shoes and practice good posture.  You may continue to have sex unless your health care provider tells you not to. Relieving pain and discomfort  Wear a good support bra to relieve breast tenderness.  Take warm sitz baths to soothe any pain or discomfort caused by hemorrhoids. Use hemorrhoid cream if your health care provider approves.  Rest with your legs elevated if you have leg cramps or low back pain.  If you develop varicose veins in your legs, wear support hose. Elevate your feet for 15 minutes, 3-4 times a day. Limit salt in your diet. Prenatal care  Schedule your prenatal visits by the twelfth week of pregnancy. They are usually scheduled monthly at first, then more often in the last 2 months before delivery.  Write down your questions. Take them to your prenatal visits.  Keep all your prenatal visits as told by your health care provider. This is important. Safety  Wear your seat belt at all times when driving.  Make a list of emergency phone numbers, including numbers for family, friends, the hospital, and police and fire departments. General instructions  Ask your health care provider for a referral to a local prenatal education class. Begin classes no later than the beginning of month 6 of your pregnancy.  Ask for help if  you have counseling or nutritional needs during pregnancy. Your health care provider can offer advice or refer you to specialists for help with various needs.  Do not use hot tubs, steam rooms, or saunas.  Do not douche or use tampons or scented sanitary pads.  Do not cross your legs for long periods of time.  Avoid cat litter boxes and soil used by cats. These carry germs that can cause birth defects in the baby and possibly loss of the fetus by miscarriage or stillbirth.  Avoid all smoking, herbs, alcohol, and medicines not prescribed by your health care provider. Chemicals in these products affect the formation and growth of the baby.  Do not use any products that   contain nicotine or tobacco, such as cigarettes and e-cigarettes. If you need help quitting, ask your health care provider. You may receive counseling support and other resources to help you quit.  Schedule a dentist appointment. At home, brush your teeth with a soft toothbrush and be gentle when you floss. Contact a health care provider if:  You have dizziness.  You have mild pelvic cramps, pelvic pressure, or nagging pain in the abdominal area.  You have persistent nausea, vomiting, or diarrhea.  You have a bad smelling vaginal discharge.  You have pain when you urinate.  You notice increased swelling in your face, hands, legs, or ankles.  You are exposed to fifth disease or chickenpox.  You are exposed to German measles (rubella) and have never had it. Get help right away if:  You have a fever.  You are leaking fluid from your vagina.  You have spotting or bleeding from your vagina.  You have severe abdominal cramping or pain.  You have rapid weight gain or loss.  You vomit blood or material that looks like coffee grounds.  You develop a severe headache.  You have shortness of breath.  You have any kind of trauma, such as from a fall or a car accident. Summary  The first trimester of pregnancy is  from week 1 until the end of week 13 (months 1 through 3).  Your body goes through many changes during pregnancy. The changes vary from woman to woman.  You will have routine prenatal visits. During those visits, your health care provider will examine you, discuss any test results you may have, and talk with you about how you are feeling. This information is not intended to replace advice given to you by your health care provider. Make sure you discuss any questions you have with your health care provider. Document Released: 10/30/2001 Document Revised: 10/17/2016 Document Reviewed: 10/17/2016 [employer] Interactive Patient Education  2018 [employer]  

## 2017-12-13 NOTE — MAU Provider Note (Signed)
History     CSN: [id number]  Arrival date and time: 12/13/17 1509   First Provider Initiated Contact with Patient 12/13/17 1658      Chief Complaint  Patient presents with  . Hypertension  . Pelvic Pain   Hypertension  This is a new problem. Episode onset: had high blood pressure during her first pregnancy.  The problem is unchanged. The problem is uncontrolled. Associated symptoms include headaches. There are no associated agents to hypertension. Past treatments include nothing. There are no compliance problems.   Pelvic Pain  The patient's primary symptoms include pelvic pain. This is a new problem. The current episode started in the past 7 days. The problem occurs constantly. The problem has been gradually worsening. Pain severity now: 8/10, worse at night. The problem affects both sides. She is pregnant. Associated symptoms include abdominal pain and headaches. The vaginal discharge was clear. There has been no bleeding. The symptoms are aggravated by tactile pressure. She has tried nothing for the symptoms. She is sexually active. She uses nothing for contraception. Her menstrual history has been regular (LMP 10/04/17 ).     Past Medical History:  Diagnosis Date  . Asthma    no hospitalizations, no recent attacks (since childhood)    Past Surgical History:  Procedure Laterality Date  . THERAPEUTIC ABORTION      Family History  Problem Relation Age of Onset  . Hypertension Mother   . Hypertension Father   . Diabetes Paternal Grandfather     Social History   Tobacco Use  . Smoking status: Never Smoker  . Smokeless tobacco: Never Used  Substance Use Topics  . Alcohol use: No  . Drug use: No    Allergies:  Allergies  Allergen Reactions  . Penicillins     Medications Prior to Admission  Medication Sig Dispense Refill Last Dose  . acyclovir (ZOVIRAX) 400 MG tablet Take 2 tablets (800 mg total) by mouth 5 (five) times daily. (Patient not taking: Reported on  04/12/2017) 70 tablet 0 Not Taking at Unknown time  . benzocaine-Menthol (DERMOPLAST) 20-0.5 % AERO Apply 1 application topically as needed for irritation (perineal discomfort). 1 each 1   . cyclobenzaprine (FLEXERIL) 10 MG tablet Take 1 tablet (10 mg total) by mouth 3 (three) times daily as needed. 15 tablet 0   . famotidine (PEPCID) 20 MG tablet Take 1 tablet (20 mg total) by mouth 2 (two) times daily. (Patient not taking: Reported on 04/01/2017) 60 tablet 0 Not Taking at Unknown time  . ibuprofen (ADVIL,MOTRIN) 600 MG tablet Take 1 tablet (600 mg total) by mouth every 6 (six) hours. 30 tablet 0   . ibuprofen (ADVIL,MOTRIN) 800 MG tablet Take 1 tablet (800 mg total) by mouth every 8 (eight) hours as needed for moderate pain. 15 tablet 0   . metoCLOPramide (REGLAN) 10 MG tablet Take 1 tablet (10 mg total) by mouth every 6 (six) hours as needed. (Patient not taking: Reported on 04/01/2017) 30 tablet 0 Not Taking at Unknown time  . Prenatal Vit-Fe Fumarate-FA (PRENATAL MULTIVITAMIN) TABS tablet Take 1 tablet by mouth daily at 12 noon.   Past Week at Unknown time  . traMADol (ULTRAM) 50 MG tablet Take 1 tablet (50 mg total) by mouth every 6 (six) hours as needed. 20 tablet 0     Review of Systems  Gastrointestinal: Positive for abdominal pain.  Genitourinary: Positive for pelvic pain.  Neurological: Positive for headaches.   Physical Exam   Blood pressure (!) 151/67,  pulse (!) 103, temperature 98.6 F (37 C), temperature source Oral, resp. rate 18, height 5\' 7"  (1.702 m), weight (!) 310 lb 12 oz (141 kg), last menstrual period 10/04/2017, SpO2 100 %, unknown if currently breastfeeding.  Physical Exam  Nursing note and vitals reviewed. Constitutional: She is oriented to person, place, and time. She appears well-developed and well-nourished. No distress.  HENT:  Head: Normocephalic.  Cardiovascular: Normal rate.  Respiratory: Effort normal.  GI: Soft. There is no tenderness. There is no rebound.   Neurological: She is alert and oriented to person, place, and time.  Skin: Skin is warm and dry.  Psychiatric: She has a normal mood and affect.   Results for orders placed or performed during the hospital encounter of 12/13/17 (from the past 24 hour(s))  Urinalysis, Routine w reflex microscopic     Status: Abnormal   Collection Time: 12/13/17  3:44 PM  Result Value Ref Range   Color, Urine YELLOW YELLOW   APPearance HAZY (A) CLEAR   Specific Gravity, Urine 1.023 1.005 - 1.030   pH 6.0 5.0 - 8.0   Glucose, UA NEGATIVE NEGATIVE mg/dL   Hgb urine dipstick NEGATIVE NEGATIVE   Bilirubin Urine NEGATIVE NEGATIVE   Ketones, ur NEGATIVE NEGATIVE mg/dL   Protein, ur NEGATIVE NEGATIVE mg/dL   Nitrite NEGATIVE NEGATIVE   Leukocytes, UA TRACE (A) NEGATIVE   RBC / HPF 0-5 0 - 5 RBC/hpf   WBC, UA 6-30 0 - 5 WBC/hpf   Bacteria, UA NONE SEEN NONE SEEN   Squamous Epithelial / LPF 6-30 (A) NONE SEEN   Mucus PRESENT   Protein / creatinine ratio, urine     Status: None   Collection Time: 12/13/17  3:44 PM  Result Value Ref Range   Creatinine, Urine 184.00 mg/dL   Total Protein, Urine 7 mg/dL   Protein Creatinine Ratio 0.04 0.00 - 0.15 mg/mg[Cre]  Pregnancy, urine POC     Status: Abnormal   Collection Time: 12/13/17  3:59 PM  Result Value Ref Range   Preg Test, Ur POSITIVE (A) NEGATIVE  CBC     Status: Abnormal   Collection Time: 12/13/17  5:16 PM  Result Value Ref Range   WBC 12.5 (H) 4.0 - 10.5 K/uL   RBC 4.54 3.87 - 5.11 MIL/uL   Hemoglobin 10.0 (L) 12.0 - 15.0 g/dL   HCT 16.1 (L) 09.6 - 04.5 %   MCV 71.1 (L) 78.0 - 100.0 fL   MCH 22.0 (L) 26.0 - 34.0 pg   MCHC 31.0 30.0 - 36.0 g/dL   RDW 40.9 (H) 81.1 - 91.4 %   Platelets 339 150 - 400 K/uL  Comprehensive metabolic panel     Status: Abnormal   Collection Time: 12/13/17  5:16 PM  Result Value Ref Range   Sodium 135 135 - 145 mmol/L   Potassium 3.8 3.5 - 5.1 mmol/L   Chloride 105 101 - 111 mmol/L   CO2 21 (L) 22 - 32 mmol/L    Glucose, Bld 83 65 - 99 mg/dL   BUN 13 6 - 20 mg/dL   Creatinine, Ser 7.82 0.44 - 1.00 mg/dL   Calcium 9.5 8.9 - 95.6 mg/dL   Total Protein 7.7 6.5 - 8.1 g/dL   Albumin 3.7 3.5 - 5.0 g/dL   AST 14 (L) 15 - 41 U/L   ALT 13 (L) 14 - 54 U/L   Alkaline Phosphatase 46 38 - 126 U/L   Total Bilirubin 0.4 0.3 - 1.2 mg/dL  GFR calc non Af Amer >60 >60 mL/min   GFR calc Af Amer >60 >60 mL/min   Anion gap 9 5 - 15  hCG, quantitative, pregnancy     Status: Abnormal   Collection Time: 12/13/17  5:16 PM  Result Value Ref Range   hCG, Beta Chain, Quant, S 41,775 (H) <5 mIU/mL  Wet prep, genital     Status: Abnormal   Collection Time: 12/13/17  5:27 PM  Result Value Ref Range   Yeast Wet Prep HPF POC NONE SEEN NONE SEEN   Trich, Wet Prep NONE SEEN NONE SEEN   Clue Cells Wet Prep HPF POC NONE SEEN NONE SEEN   WBC, Wet Prep HPF POC FEW (A) NONE SEEN   Sperm NONE SEEN    US Ob Comp Less 14 Wks  Result Date: 12/13/2017 CLINICAL DATA:  Abdominal pain. Patient is [redacted] weeks pregnant per last menstrual period. EXAM: OBSTETRIC <14 WK ULTRASOUND TECHNIQUE: Transabdominal ultrasound was performed for evaluation of the gestation as well as the maternal uterus and adnexal regions. COMPARISON:  Pelvic ultrasound 08/29/2016 FINDINGS: Intrauterine gestational sac: Single Yolk sac:  Not Visualized. Embryo:  Visualized. Cardiac Activity: Visualized. Heart Rate: 170 bpm CRL:   3.84 cm   10 w 5 d                  [place] EDC: 07/06/2018 Subchorionic hemorrhage:  None visualized. Maternal uterus/adnexae: Right ovary within normal limits. Left ovary was not visualized. IMPRESSION: Single live intrauterine pregnancy corresponding to 10 weeks and 5 days gestation. Electronically Signed   By: Ted Mcalpine M.D.   On: 12/13/2017 18:34   MAU Course  Procedures  MDM   Assessment and Plan   1. Abdominal pain during pregnancy in first trimester   2. [redacted] weeks gestation of pregnancy   3. Intrauterine pregnancy    DC  home Comfort measures reviewed  1st Trimester precautions  RX: none Return to MAU as needed FU with OB as planned  Follow-up Information    [facility] OB[facility] Follow up.   Contact information: [address] [facility] [place] [address] [phone]           Thressa Sheller 12/13/2017, 5:00 PM

## 2017-12-16 LAB — GC/CHLAMYDIA PROBE AMP (~~LOC~~) NOT AT ARMC
Chlamydia: NEGATIVE
Neisseria Gonorrhea: NEGATIVE

## 2017-12-20 DIAGNOSIS — O09299 Supervision of pregnancy with other poor reproductive or obstetric history, unspecified trimester: Secondary | ICD-10-CM | POA: Insufficient documentation

## 2017-12-20 DIAGNOSIS — Z8759 Personal history of other complications of pregnancy, childbirth and the puerperium: Secondary | ICD-10-CM | POA: Insufficient documentation

## 2017-12-20 LAB — OB RESULTS CONSOLE HIV ANTIBODY (ROUTINE TESTING)
HIV: NONREACTIVE
HIV: NONREACTIVE

## 2017-12-20 LAB — OB RESULTS CONSOLE ANTIBODY SCREEN: Antibody Screen: NEGATIVE

## 2017-12-20 LAB — OB RESULTS CONSOLE ABO/RH: RH Type: POSITIVE

## 2017-12-20 LAB — OB RESULTS CONSOLE RUBELLA ANTIBODY, IGM
Rubella: IMMUNE
Rubella: IMMUNE

## 2017-12-20 LAB — OB RESULTS CONSOLE RPR
RPR: NONREACTIVE
RPR: NONREACTIVE

## 2017-12-20 LAB — OB RESULTS CONSOLE GC/CHLAMYDIA
Chlamydia: NEGATIVE
Gonorrhea: NEGATIVE

## 2017-12-20 LAB — OB RESULTS CONSOLE HEPATITIS B SURFACE ANTIGEN
Hepatitis B Surface Ag: NEGATIVE
Hepatitis B Surface Ag: NEGATIVE

## 2018-02-05 ENCOUNTER — Emergency Department
Admission: EM | Admit: 2018-02-05 | Discharge: 2018-02-05 | Disposition: A | Discharge status: Home / Self Care | Payer: Medicaid Other | Attending: Emergency Medicine | Admitting: Emergency Medicine

## 2018-02-05 ENCOUNTER — Emergency Department: Payer: Medicaid Other

## 2018-02-05 ENCOUNTER — Other Ambulatory Visit: Payer: Self-pay

## 2018-02-05 ENCOUNTER — Encounter: Payer: Self-pay | Admitting: Emergency Medicine

## 2018-02-05 DIAGNOSIS — J45909 Unspecified asthma, uncomplicated: Secondary | ICD-10-CM | POA: Insufficient documentation

## 2018-02-05 DIAGNOSIS — O26892 Other specified pregnancy related conditions, second trimester: Secondary | ICD-10-CM | POA: Insufficient documentation

## 2018-02-05 DIAGNOSIS — R319 Hematuria, unspecified: Secondary | ICD-10-CM | POA: Diagnosis not present

## 2018-02-05 DIAGNOSIS — O10012 Pre-existing essential hypertension complicating pregnancy, second trimester: Secondary | ICD-10-CM | POA: Insufficient documentation

## 2018-02-05 DIAGNOSIS — O99512 Diseases of the respiratory system complicating pregnancy, second trimester: Secondary | ICD-10-CM | POA: Insufficient documentation

## 2018-02-05 DIAGNOSIS — Z3492 Encounter for supervision of normal pregnancy, unspecified, second trimester: Secondary | ICD-10-CM | POA: Insufficient documentation

## 2018-02-05 DIAGNOSIS — R109 Unspecified abdominal pain: Secondary | ICD-10-CM

## 2018-02-05 LAB — CBC WITH DIFFERENTIAL/PLATELET
Basophils Absolute: 0 10*3/uL (ref 0–0.1)
Basophils Relative: 0 %
Eosinophils Absolute: 0.1 10*3/uL (ref 0–0.7)
Eosinophils Relative: 1 %
HCT: 33.1 % — ABNORMAL LOW (ref 35.0–47.0)
Hemoglobin: 10.5 g/dL — ABNORMAL LOW (ref 12.0–16.0)
Lymphocytes Relative: 8 %
Lymphs Abs: 0.6 10*3/uL — ABNORMAL LOW (ref 1.0–3.6)
MCH: 23.4 pg — ABNORMAL LOW (ref 26.0–34.0)
MCHC: 31.8 g/dL — ABNORMAL LOW (ref 32.0–36.0)
MCV: 73.8 fL — ABNORMAL LOW (ref 80.0–100.0)
Monocytes Absolute: 0.3 10*3/uL (ref 0.2–0.9)
Monocytes Relative: 4 %
Neutro Abs: 7 10*3/uL — ABNORMAL HIGH (ref 1.4–6.5)
Neutrophils Relative %: 87 %
Platelets: 233 10*3/uL (ref 150–440)
RBC: 4.49 MIL/uL (ref 3.80–5.20)
RDW: 19.4 % — ABNORMAL HIGH (ref 11.5–14.5)
WBC: 8.1 10*3/uL (ref 3.6–11.0)

## 2018-02-05 LAB — COMPREHENSIVE METABOLIC PANEL
ALT: 13 U/L — ABNORMAL LOW (ref 14–54)
AST: 20 U/L (ref 15–41)
Albumin: 3.5 g/dL (ref 3.5–5.0)
Alkaline Phosphatase: 41 U/L (ref 38–126)
Anion gap: 10 (ref 5–15)
BUN: 10 mg/dL (ref 6–20)
CO2: 18 mmol/L — ABNORMAL LOW (ref 22–32)
Calcium: 8.7 mg/dL — ABNORMAL LOW (ref 8.9–10.3)
Chloride: 108 mmol/L (ref 101–111)
Creatinine, Ser: 0.57 mg/dL (ref 0.44–1.00)
GFR calc Af Amer: >60 mL/min (ref >60)
GFR calc non Af Amer: >60 mL/min (ref >60)
Glucose, Bld: 94 mg/dL (ref 65–99)
Potassium: 3.6 mmol/L (ref 3.5–5.1)
Sodium: 136 mmol/L (ref 135–145)
Total Bilirubin: 0.7 mg/dL (ref 0.3–1.2)
Total Protein: 7.2 g/dL (ref 6.5–8.1)

## 2018-02-05 LAB — URINALYSIS, COMPLETE (UACMP) WITH MICROSCOPIC
Bacteria, UA: NONE SEEN
Bilirubin Urine: NEGATIVE
Glucose, UA: NEGATIVE mg/dL
Ketones, ur: NEGATIVE mg/dL
Nitrite: NEGATIVE
Protein, ur: 30 mg/dL — AB
Specific Gravity, Urine: 1.021 (ref 1.005–1.030)
pH: 5 (ref 5.0–8.0)

## 2018-02-05 LAB — ABO/RH: ABO/RH(D): A POS

## 2018-02-05 LAB — HCG, QUANTITATIVE, PREGNANCY: hCG, Beta Chain, Quant, S: 6120 m[IU]/mL — ABNORMAL HIGH (ref <5)

## 2018-02-05 MED ORDER — ONDANSETRON 4 MG PO TBDP
4.0000 mg | ORAL_TABLET | Freq: Once | ORAL | Status: AC
  Administered 2018-02-05: 4 mg via ORAL

## 2018-02-05 MED ORDER — ONDANSETRON 4 MG PO TBDP
ORAL_TABLET | ORAL | Status: AC
  Administered 2018-02-05: 4 mg via ORAL
  Filled 2018-02-05: qty 1

## 2018-02-05 NOTE — ED Provider Notes (Signed)
[facility] Emergency Department Provider Note       Time seen: ----------------------------------------- 8:56 AM on 02/05/2018 -----------------------------------------   I have reviewed the triage vital signs and the nursing notes.  HISTORY   Chief Complaint Vaginal Bleeding    HPI Tiffany Sanchez is a 22 y.o. female with a history of asthma who presents to the ED for possible vaginal bleeding.  Patient states she is G3 and due date is August 16.  [facility] without any complications.  She awoke this morning and noticed some possible vaginal bleeding.  She is also not clear if it came from her urine.  She denies any abdominal pain at this time but had had some abdominal cramping 4 out of 10.  She is currently around 19 weeks.  Past Medical History:  Diagnosis Date  . Asthma    no hospitalizations, no recent attacks (since childhood)    Patient Active Problem List   Diagnosis Date Noted  . Chronic hypertension during pregnancy, antepartum 12/13/2017  . Gestational hypertension 04/12/2017  . Shortness of breath 12/28/2016    Past Surgical History:  Procedure Laterality Date  . THERAPEUTIC ABORTION      Allergies Penicillins  Social History Social History   Tobacco Use  . Smoking status: Never Smoker  . Smokeless tobacco: Never Used  Substance Use Topics  . Alcohol use: No  . Drug use: No   Review of Systems Constitutional: Negative for fever. Cardiovascular: Negative for chest pain. Respiratory: Negative for shortness of breath. Gastrointestinal: Positive for abdominal cramping Genitourinary: Positive for possible vaginal bleeding versus hematuria Musculoskeletal: Negative for back pain. Skin: Negative for rash. Neurological: Negative for headaches, focal weakness or numbness.  All systems negative/normal/unremarkable except as stated in the HPI  ____________________________________________   PHYSICAL  EXAM:  VITAL SIGNS: ED Triage Vitals  Enc Vitals Group     BP 02/05/18 0639 139/60     Pulse Rate 02/05/18 0639 (!) 126     Resp 02/05/18 0639 18     Temp 02/05/18 0639 98.1 F (36.7 C)     Temp Source 02/05/18 0639 Oral     SpO2 02/05/18 0639 99 %     Weight 02/05/18 0636 (!) 318 lb (144.2 kg)     Height 02/05/18 0636 5\' 7"  (1.702 m)     Head Circumference --      Peak Flow --      Pain Score 02/05/18 0800 4     Pain Loc --      Pain Edu? --      Excl. in GC? --    Constitutional: Alert and oriented. Well appearing and in no distress. Eyes: Conjunctivae are normal. Normal extraocular movements. Cardiovascular: Normal rate, regular rhythm. No murmurs, rubs, or gallops. Respiratory: Normal respiratory effort without tachypnea nor retractions. Breath sounds are clear and equal bilaterally. No wheezes/rales/rhonchi. Gastrointestinal: Gravid uterus, normal bowel sounds. Musculoskeletal: Nontender with normal range of motion in extremities. No lower extremity tenderness nor edema. Neurologic:  Normal speech and language. No gross focal neurologic deficits are appreciated.  Skin:  Skin is warm, dry and intact. No rash noted. Psychiatric: Mood and affect are normal. Speech and behavior are normal.  ___________________________________________  ED COURSE:  As part of my medical decision making, I reviewed the following data within the electronic MEDICAL RECORD NUMBER History obtained from family if available, nursing notes, old chart and ekg, as well as notes from prior ED visits. Patient  presented for vaginal bleeding in early pregnancy, we will assess with labs and imaging as indicated at this time.   Procedures ____________________________________________   LABS (pertinent positives/negatives)  Labs Reviewed  URINALYSIS, COMPLETE (UACMP) WITH MICROSCOPIC - Abnormal; Notable for the following components:      Result Value   Color, Urine AMBER (*)    APPearance CLOUDY (*)    Hgb  urine dipstick LARGE (*)    Protein, ur 30 (*)    Leukocytes, UA TRACE (*)    Squamous Epithelial / LPF TOO NUMEROUS TO COUNT (*)    All other components within normal limits  CBC WITH DIFFERENTIAL/PLATELET - Abnormal; Notable for the following components:   Hemoglobin 10.5 (*)    HCT 33.1 (*)    MCV 73.8 (*)    MCH 23.4 (*)    MCHC 31.8 (*)    RDW 19.4 (*)    Neutro Abs 7.0 (*)    Lymphs Abs 0.6 (*)    All other components within normal limits  COMPREHENSIVE METABOLIC PANEL - Abnormal; Notable for the following components:   CO2 18 (*)    Calcium 8.7 (*)    ALT 13 (*)    All other components within normal limits  HCG, QUANTITATIVE, PREGNANCY - Abnormal; Notable for the following components:   hCG, Beta Chain, Quant, S 6,120 (*)    All other components within normal limits  URINE CULTURE  ABO/RH    RADIOLOGY  Ultrasound OB limited IMPRESSION: Single live intrauterine gestation with estimated gestational age of approximately 19 weeks. No placental lesion. Cervical os closed. Amniotic fluid volume appears within normal limits for gestational age.  This exam is performed as a limited study on an emergent basis to address a specific clinical concern and does not comprehensively evaluate fetal size, dating, or anatomy; follow-up complete OB US should be considered if further fetal assessment is warranted.  ____________________________________________  DIFFERENTIAL DIAGNOSIS   Threatened miscarriage, hematuria, UTI, rectal bleeding  FINAL ASSESSMENT AND PLAN  Hematuria   Plan: The patient had presented for possible vaginal bleeding. Patient's labs were reassuring but did reveal some hematuria. Patient's imaging was negative.  I think this likely represents hematuria or early UTI versus a threatened miscarriage.  Her ultrasound is normal and cervical loss is closed.  We have sent for urine culture, it is a poor urine specimen.  She will be seen by her [facility] doctor  tomorrow.   Ulice Dash, MD   Note: This note was generated in part or whole with voice recognition software. Voice recognition is usually quite accurate but there are transcription errors that can and very often do occur. I apologize for any typographical errors that were not detected and corrected.     Emily Filbert, MD 02/05/18 [phone]

## 2018-02-05 NOTE — ED Notes (Signed)
First Nurse Note:  Patient to Room 6, Rennie NatterGreg Charge Nurse aware.

## 2018-02-05 NOTE — ED Triage Notes (Addendum)
Patient ambulatory to triage with steady gait, without difficulty or distress noted; G1P1, EDC 8/16, [facility] at [facility] with no complications; st awoke at 6am with vag bleeding; denies any pain or hx of same

## 2018-02-05 NOTE — ED Notes (Signed)
Patient is in US at this time

## 2018-02-06 LAB — URINE CULTURE: Special Requests: NORMAL

## 2018-05-15 ENCOUNTER — Encounter
Admission: RE | Admit: 2018-05-15 | Discharge: 2018-05-15 | Disposition: A | Discharge status: Home / Self Care | Payer: Medicaid Other | Source: Ambulatory Visit | Attending: Anesthesiology | Admitting: Anesthesiology

## 2018-05-15 NOTE — OR Nursing (Signed)
[facility] due to Scheduled C/S in august and high BMI.  Per pt [facility].  BMI =  51.06.  Dr Henrene HawkingKephart notified.  He states pt will have to liver at another facility due to BMI limit here 50.  [facility] OB notified.  Pt verb understanding

## 2018-05-19 ENCOUNTER — Encounter: Payer: Self-pay | Admitting: Radiology

## 2018-05-30 ENCOUNTER — Other Ambulatory Visit: Payer: Self-pay | Admitting: Obstetrics & Gynecology

## 2018-05-30 ENCOUNTER — Other Ambulatory Visit (HOSPITAL_COMMUNITY)
Admission: RE | Admit: 2018-05-30 | Discharge: 2018-05-30 | Disposition: A | Discharge status: Home / Self Care | Payer: Medicaid Other | Source: Ambulatory Visit | Attending: Obstetrics & Gynecology | Admitting: Obstetrics & Gynecology

## 2018-05-30 ENCOUNTER — Ambulatory Visit (HOSPITAL_COMMUNITY): Payer: Medicaid Other

## 2018-05-30 ENCOUNTER — Ambulatory Visit (HOSPITAL_COMMUNITY)
Admission: RE | Admit: 2018-05-30 | Discharge: 2018-05-30 | Disposition: A | Discharge status: Home / Self Care | Payer: Medicaid Other | Source: Ambulatory Visit | Attending: Obstetrics & Gynecology | Admitting: Obstetrics & Gynecology

## 2018-05-30 ENCOUNTER — Ambulatory Visit (INDEPENDENT_AMBULATORY_CARE_PROVIDER_SITE_OTHER): Payer: Medicaid Other | Admitting: Obstetrics & Gynecology

## 2018-05-30 ENCOUNTER — Encounter: Payer: Self-pay | Admitting: Obstetrics & Gynecology

## 2018-05-30 VITALS — BP 122/77 | HR 94 | Wt 327.2 lb

## 2018-05-30 DIAGNOSIS — O163 Unspecified maternal hypertension, third trimester: Secondary | ICD-10-CM | POA: Insufficient documentation

## 2018-05-30 DIAGNOSIS — O288 Other abnormal findings on antenatal screening of mother: Secondary | ICD-10-CM

## 2018-05-30 DIAGNOSIS — O289 Unspecified abnormal findings on antenatal screening of mother: Secondary | ICD-10-CM

## 2018-05-30 DIAGNOSIS — Z3A35 35 weeks gestation of pregnancy: Secondary | ICD-10-CM

## 2018-05-30 DIAGNOSIS — O24419 Gestational diabetes mellitus in pregnancy, unspecified control: Secondary | ICD-10-CM | POA: Diagnosis not present

## 2018-05-30 DIAGNOSIS — O10913 Unspecified pre-existing hypertension complicating pregnancy, third trimester: Secondary | ICD-10-CM

## 2018-05-30 DIAGNOSIS — O0993 Supervision of high risk pregnancy, unspecified, third trimester: Secondary | ICD-10-CM | POA: Insufficient documentation

## 2018-05-30 DIAGNOSIS — O10919 Unspecified pre-existing hypertension complicating pregnancy, unspecified trimester: Secondary | ICD-10-CM

## 2018-05-30 DIAGNOSIS — O99213 Obesity complicating pregnancy, third trimester: Secondary | ICD-10-CM | POA: Diagnosis not present

## 2018-05-30 DIAGNOSIS — O99210 Obesity complicating pregnancy, unspecified trimester: Secondary | ICD-10-CM | POA: Insufficient documentation

## 2018-05-30 NOTE — Progress Notes (Signed)
  c-section delivery

## 2018-05-31 LAB — HEMOGLOBIN A1C
Est. average glucose Bld gHb Est-mCnc: 103 mg/dL
Hgb A1c MFr Bld: 5.2 % (ref 4.8–5.6)

## 2018-06-02 LAB — CERVICOVAGINAL ANCILLARY ONLY
Chlamydia: NEGATIVE
Neisseria Gonorrhea: NEGATIVE

## 2018-06-03 ENCOUNTER — Ambulatory Visit (INDEPENDENT_AMBULATORY_CARE_PROVIDER_SITE_OTHER): Payer: Medicaid Other | Admitting: Obstetrics and Gynecology

## 2018-06-03 ENCOUNTER — Encounter: Payer: Self-pay | Admitting: Obstetrics and Gynecology

## 2018-06-03 ENCOUNTER — Encounter (HOSPITAL_COMMUNITY): Payer: Self-pay

## 2018-06-03 VITALS — BP 119/75 | HR 111 | Wt 326.0 lb

## 2018-06-03 DIAGNOSIS — O10913 Unspecified pre-existing hypertension complicating pregnancy, third trimester: Secondary | ICD-10-CM | POA: Diagnosis not present

## 2018-06-03 DIAGNOSIS — O99210 Obesity complicating pregnancy, unspecified trimester: Secondary | ICD-10-CM

## 2018-06-03 DIAGNOSIS — O99213 Obesity complicating pregnancy, third trimester: Secondary | ICD-10-CM | POA: Diagnosis not present

## 2018-06-03 DIAGNOSIS — O10919 Unspecified pre-existing hypertension complicating pregnancy, unspecified trimester: Secondary | ICD-10-CM

## 2018-06-03 DIAGNOSIS — Z8619 Personal history of other infectious and parasitic diseases: Secondary | ICD-10-CM

## 2018-06-03 DIAGNOSIS — O0993 Supervision of high risk pregnancy, unspecified, third trimester: Secondary | ICD-10-CM | POA: Diagnosis not present

## 2018-06-03 DIAGNOSIS — A6000 Herpesviral infection of urogenital system, unspecified: Secondary | ICD-10-CM | POA: Insufficient documentation

## 2018-06-03 DIAGNOSIS — Z6841 Body Mass Index (BMI) 40.0 and over, adult: Secondary | ICD-10-CM

## 2018-06-03 HISTORY — DX: Herpesviral infection of urogenital system, unspecified: A60.00

## 2018-06-03 LAB — CULTURE, BETA STREP (GROUP B ONLY): Strep Gp B Culture: NEGATIVE

## 2018-06-03 MED ORDER — VALACYCLOVIR HCL 500 MG PO TABS: 500.0000 mg | ORAL_TABLET | Freq: Two times a day (BID) | ORAL | 0 refills | Status: DC

## 2018-06-03 NOTE — Progress Notes (Signed)
mfmt

## 2018-06-03 NOTE — Progress Notes (Signed)
Prenatal Visit Note Date: 06/03/2018 Clinic: [facility]  Subjective:  Tiffany Sanchez is a 22 y.o. G3P1011 at 2165w4d being seen today for ongoing prenatal care.  She is currently monitored for the following issues for this high-risk pregnancy and has Chronic hypertension during pregnancy, antepartum; Supervision of high risk pregnancy, antepartum, third trimester; Obesity in pregnancy; Labor and delivery affected by shoulder dystocia; BMI 50.0-59.9, adult (HCC); and History of herpes genitalis on their problem list.  Patient reports no complaints.   Contractions: Irregular. Vag. Bleeding: None.  Movement: Present. Denies leaking of fluid.   The following portions of the patient's history were reviewed and updated as appropriate: allergies, current medications, past family history, past medical history, past social history, past surgical history and problem list. Problem list updated.  Objective:   Vitals:   06/03/18 1359  BP: 119/75  Pulse: (!) 111  Weight: (!) 326 lb (147.9 kg)    Fetal Status: Fetal Heart Rate (bpm): NST   Movement: Present     General:  Alert, oriented and cooperative. Patient is in no acute distress.  Skin: Skin is warm and dry. No rash noted.   Cardiovascular: Normal heart rate noted  Respiratory: Normal respiratory effort, no problems with respiration noted  Abdomen: Soft, gravid, appropriate for gestational age. Pain/Pressure: Present     Pelvic:  Cervical exam deferred        Extremities: Normal range of motion.     Mental Status: Normal mood and affect. Normal behavior. Normal judgment and thought content.   Urinalysis:      Assessment and Plan:  Pregnancy: G3P1011 at 565w4d  1. Supervision of high risk pregnancy, antepartum, third trimester Routine care. D/w pt re: BC nv  2. BMI 50.0-59.9, adult (HCC) stable  3. Obesity in pregnancy  4. History of herpes genitalis Start valtrex this week  5. Chronic hypertension during  pregnancy, antepartum Doing well only on low dose ASA. 145 baseline, +accels, no decel, mod variability, toco quiet x 6061m. Reactive nst. bpp 8/8 last week. Has weekly bpps scheduled.   6. Labor and delivery affected by shoulder dystocia Delivery note d/w her. Child is doing well with no deficits. After d/w pt, she desires primary c-section. Request for 8/9 sent  Preterm labor symptoms and general obstetric precautions including but not limited to vaginal bleeding, contractions, leaking of fluid and fetal movement were reviewed in detail with the patient. Please refer to After Visit Summary for other counseling recommendations.  Return in about 1 week (around 06/10/2018) for hrob, nst.   [place]bank BingPickens, Davontay Watlington, MD

## 2018-06-03 NOTE — Progress Notes (Signed)
  Subjective:    Tiffany Sanchez is being seen today for her first obstetrical visit.  She is at 3765w4d gestation. She is transferring here because her previous practice delivers at a hospital where anesthesia cannot do an epidural for her due to her BMI.  Her obstetrical history is significant for obesity and GHTN.  Pregnancy history fully reviewed.  Patient reports no complaints.  Review of Systems:   Review of Systems  Objective:     BP 122/77   Pulse 94   Wt (!) 327 lb 3.2 oz (148.4 kg)   LMP 09/27/2017 (Approximate)   BMI 51.25 kg/m  Physical Exam  Exam    Assessment:    Pregnancy: G3P1011 Patient Active Problem List   Diagnosis Date Noted  . BMI 50.0-59.9, adult (HCC) 06/03/2018  . History of herpes genitalis 06/03/2018  . Supervision of high risk pregnancy, antepartum, third trimester 05/30/2018  . Obesity in pregnancy 05/30/2018  . Labor and delivery affected by shoulder dystocia 05/30/2018  . Chronic hypertension during pregnancy, antepartum 12/13/2017       Plan:     Initial labs drawn. Prenatal vitamins. Problem list reviewed and updated. Follow up in 1 weeks. Weekly testing ordered  Allie BossierMyra C Mayfield Schoene 06/03/2018

## 2018-06-06 ENCOUNTER — Ambulatory Visit (HOSPITAL_COMMUNITY)
Admission: RE | Admit: 2018-06-06 | Discharge: 2018-06-06 | Disposition: A | Discharge status: Home / Self Care | Payer: Medicaid Other | Source: Ambulatory Visit | Attending: Family Medicine | Admitting: Family Medicine

## 2018-06-06 ENCOUNTER — Other Ambulatory Visit: Payer: Self-pay | Admitting: Obstetrics & Gynecology

## 2018-06-06 ENCOUNTER — Other Ambulatory Visit (HOSPITAL_COMMUNITY): Payer: Medicaid Other

## 2018-06-06 DIAGNOSIS — O09293 Supervision of pregnancy with other poor reproductive or obstetric history, third trimester: Secondary | ICD-10-CM

## 2018-06-06 DIAGNOSIS — Z3A36 36 weeks gestation of pregnancy: Secondary | ICD-10-CM | POA: Diagnosis not present

## 2018-06-06 DIAGNOSIS — O163 Unspecified maternal hypertension, third trimester: Secondary | ICD-10-CM

## 2018-06-06 DIAGNOSIS — O99213 Obesity complicating pregnancy, third trimester: Secondary | ICD-10-CM | POA: Insufficient documentation

## 2018-06-06 DIAGNOSIS — O99210 Obesity complicating pregnancy, unspecified trimester: Secondary | ICD-10-CM

## 2018-06-06 DIAGNOSIS — O10919 Unspecified pre-existing hypertension complicating pregnancy, unspecified trimester: Secondary | ICD-10-CM

## 2018-06-06 DIAGNOSIS — O10013 Pre-existing essential hypertension complicating pregnancy, third trimester: Secondary | ICD-10-CM | POA: Insufficient documentation

## 2018-06-09 ENCOUNTER — Telehealth: Payer: Self-pay

## 2018-06-09 ENCOUNTER — Telehealth: Payer: Self-pay | Admitting: Family Medicine

## 2018-06-09 NOTE — Telephone Encounter (Signed)
Returned patient call  - advised of c/s date (06/28/18) - went over process with patient; advised she should be receiving a call within the next 1-2 weeks to schedule to have her prescreening done.  Patient decided to keep dates: 06/28/18 for c/s. Patient stated she understood and had no other questions.

## 2018-06-09 NOTE — Telephone Encounter (Signed)
Disregard

## 2018-06-09 NOTE — Telephone Encounter (Signed)
Pt would like c/s scheduled on a Friday please so she can have help with caring for her other child. Please call pt for surgery date. Pt states she is 37w this week.

## 2018-06-09 NOTE — Telephone Encounter (Signed)
Pt states 37 weeks. Pt says no contractions. Lower abdominal pain and pressure and hip pain since last night. Pt has appt tomorrow but would like to speak to a nurse today.

## 2018-06-09 NOTE — Telephone Encounter (Signed)
Returned patient call - LMOVM in detail advising the Sxs she is experiencing is a normal part of pregnancy; make sure she's drinking plenty of water and getting adequate rest; keep her scheduled appointment for  tomorrow; if Sxs worsen through the night she [facility] and treatment.

## 2018-06-10 ENCOUNTER — Other Ambulatory Visit (HOSPITAL_COMMUNITY)
Admission: RE | Admit: 2018-06-10 | Discharge: 2018-06-10 | Disposition: A | Discharge status: Home / Self Care | Payer: Medicaid Other | Source: Ambulatory Visit | Attending: Family Medicine | Admitting: Family Medicine

## 2018-06-10 ENCOUNTER — Encounter: Payer: Medicaid Other | Admitting: Family Medicine

## 2018-06-10 ENCOUNTER — Ambulatory Visit (INDEPENDENT_AMBULATORY_CARE_PROVIDER_SITE_OTHER): Payer: Medicaid Other | Admitting: Family Medicine

## 2018-06-10 DIAGNOSIS — O10913 Unspecified pre-existing hypertension complicating pregnancy, third trimester: Secondary | ICD-10-CM | POA: Diagnosis not present

## 2018-06-10 DIAGNOSIS — O0993 Supervision of high risk pregnancy, unspecified, third trimester: Secondary | ICD-10-CM | POA: Insufficient documentation

## 2018-06-10 DIAGNOSIS — Z3A36 36 weeks gestation of pregnancy: Secondary | ICD-10-CM | POA: Diagnosis not present

## 2018-06-10 DIAGNOSIS — O10919 Unspecified pre-existing hypertension complicating pregnancy, unspecified trimester: Secondary | ICD-10-CM

## 2018-06-10 LAB — OB RESULTS CONSOLE GBS: GBS: NEGATIVE

## 2018-06-10 NOTE — Progress Notes (Signed)
   PRENATAL VISIT NOTE  Subjective:  Tiffany Sanchez is a 22 y.o. G3P1011 at 4862w4d being seen today for ongoing prenatal care.  She is currently monitored for the following issues for this high-risk pregnancy and has Chronic hypertension during pregnancy, antepartum; Supervision of high risk pregnancy, antepartum, third trimester; Obesity in pregnancy; Labor and delivery affected by shoulder dystocia; BMI 50.0-59.9, adult (HCC); and History of herpes genitalis on their problem list.  Patient reports contractions since last few days.  Contractions: Irregular. Vag. Bleeding: None.  Movement: Present. Denies leaking of fluid.   The following portions of the patient's history were reviewed and updated as appropriate: allergies, current medications, past family history, past medical history, past social history, past surgical history and problem list. Problem list updated.  Objective:  There were no vitals filed for this visit.  Fetal Status: Fetal Heart Rate (bpm): NST Fundal Height: 38 cm Movement: Present  Presentation: Vertex  General:  Alert, oriented and cooperative. Patient is in no acute distress.  Skin: Skin is warm and dry. No rash noted.   Cardiovascular: Normal heart rate noted  Respiratory: Normal respiratory effort, no problems with respiration noted  Abdomen: Soft, gravid, appropriate for gestational age.  Pain/Pressure: Present     Pelvic: Cervical exam performed Dilation: 1.5 Effacement (%): 20 Station: -3  Extremities: Normal range of motion.     Mental Status: Normal mood and affect. Normal behavior. Normal judgment and thought content.  NST:  Baseline: 130 bpm, Variability: Good {> 6 bpm), Accelerations: Reactive and Decelerations: Absent   Assessment and Plan:  Pregnancy: G3P1011 at 3662w4d  1. Supervision of high risk pregnancy, antepartum, third trimester Cultures today - GC/Chlamydia probe amp ([place] - Culture, beta strep (group b only)  2. Chronic  hypertension during pregnancy, antepartum Has u/s for growth this week NST reactive today Weekly BPP On ASA, no meds. BP is well controlled. - US Fetal BPP W/O Non Stress; Future  Preterm labor symptoms and general obstetric precautions including but not limited to vaginal bleeding, contractions, leaking of fluid and fetal movement were reviewed in detail with the patient. Please refer to After Visit Summary for other counseling recommendations.  Return in 1 week (on 06/17/2018).  Future Appointments  Date Time Provider Department Center  06/13/2018  9:00 AM [facility] US 3 [facility]  06/20/2018  9:15 AM [facility] US 4 [facility]US    Reva Boresanya S Nishita Isaacks, MD

## 2018-06-10 NOTE — Patient Instructions (Signed)

## 2018-06-11 ENCOUNTER — Encounter (HOSPITAL_COMMUNITY): Payer: Self-pay

## 2018-06-11 LAB — CERVICOVAGINAL ANCILLARY ONLY
Chlamydia: NEGATIVE
Neisseria Gonorrhea: NEGATIVE

## 2018-06-13 ENCOUNTER — Ambulatory Visit (HOSPITAL_COMMUNITY)
Admission: RE | Admit: 2018-06-13 | Discharge: 2018-06-13 | Disposition: A | Discharge status: Home / Self Care | Payer: Medicaid Other | Source: Ambulatory Visit | Attending: Obstetrics & Gynecology | Admitting: Obstetrics & Gynecology

## 2018-06-13 ENCOUNTER — Other Ambulatory Visit: Payer: Self-pay | Admitting: Obstetrics and Gynecology

## 2018-06-13 ENCOUNTER — Encounter (HOSPITAL_COMMUNITY): Payer: Self-pay

## 2018-06-13 ENCOUNTER — Other Ambulatory Visit: Payer: Self-pay | Admitting: Obstetrics & Gynecology

## 2018-06-13 DIAGNOSIS — Z3A37 37 weeks gestation of pregnancy: Secondary | ICD-10-CM

## 2018-06-13 DIAGNOSIS — O98513 Other viral diseases complicating pregnancy, third trimester: Secondary | ICD-10-CM | POA: Insufficient documentation

## 2018-06-13 DIAGNOSIS — O24419 Gestational diabetes mellitus in pregnancy, unspecified control: Secondary | ICD-10-CM

## 2018-06-13 DIAGNOSIS — B009 Herpesviral infection, unspecified: Secondary | ICD-10-CM

## 2018-06-13 DIAGNOSIS — O0993 Supervision of high risk pregnancy, unspecified, third trimester: Secondary | ICD-10-CM

## 2018-06-13 DIAGNOSIS — O98519 Other viral diseases complicating pregnancy, unspecified trimester: Secondary | ICD-10-CM | POA: Diagnosis not present

## 2018-06-13 DIAGNOSIS — O10013 Pre-existing essential hypertension complicating pregnancy, third trimester: Secondary | ICD-10-CM

## 2018-06-13 DIAGNOSIS — O99213 Obesity complicating pregnancy, third trimester: Secondary | ICD-10-CM | POA: Diagnosis not present

## 2018-06-13 DIAGNOSIS — O99210 Obesity complicating pregnancy, unspecified trimester: Secondary | ICD-10-CM

## 2018-06-13 DIAGNOSIS — O163 Unspecified maternal hypertension, third trimester: Secondary | ICD-10-CM

## 2018-06-13 DIAGNOSIS — O10919 Unspecified pre-existing hypertension complicating pregnancy, unspecified trimester: Secondary | ICD-10-CM

## 2018-06-13 DIAGNOSIS — O09293 Supervision of pregnancy with other poor reproductive or obstetric history, third trimester: Secondary | ICD-10-CM

## 2018-06-13 LAB — CULTURE, BETA STREP (GROUP B ONLY): Strep Gp B Culture: NEGATIVE

## 2018-06-14 ENCOUNTER — Other Ambulatory Visit: Payer: Self-pay

## 2018-06-14 ENCOUNTER — Encounter (HOSPITAL_COMMUNITY): Payer: Self-pay | Admitting: *Deleted

## 2018-06-14 ENCOUNTER — Inpatient Hospital Stay (HOSPITAL_COMMUNITY)
Admission: AD | Admit: 2018-06-14 | Discharge: 2018-06-14 | Disposition: A | Discharge status: Home / Self Care | Payer: Medicaid Other | Source: Ambulatory Visit | Attending: Obstetrics and Gynecology | Admitting: Obstetrics and Gynecology

## 2018-06-14 DIAGNOSIS — O99513 Diseases of the respiratory system complicating pregnancy, third trimester: Secondary | ICD-10-CM | POA: Insufficient documentation

## 2018-06-14 DIAGNOSIS — Z3A37 37 weeks gestation of pregnancy: Secondary | ICD-10-CM

## 2018-06-14 DIAGNOSIS — M549 Dorsalgia, unspecified: Secondary | ICD-10-CM | POA: Diagnosis not present

## 2018-06-14 DIAGNOSIS — O99891 Other specified diseases and conditions complicating pregnancy: Secondary | ICD-10-CM

## 2018-06-14 DIAGNOSIS — J45909 Unspecified asthma, uncomplicated: Secondary | ICD-10-CM | POA: Diagnosis not present

## 2018-06-14 DIAGNOSIS — O9989 Other specified diseases and conditions complicating pregnancy, childbirth and the puerperium: Secondary | ICD-10-CM

## 2018-06-14 DIAGNOSIS — O4703 False labor before 37 completed weeks of gestation, third trimester: Secondary | ICD-10-CM | POA: Diagnosis not present

## 2018-06-14 DIAGNOSIS — O3483 Maternal care for other abnormalities of pelvic organs, third trimester: Secondary | ICD-10-CM | POA: Diagnosis not present

## 2018-06-14 DIAGNOSIS — Z79899 Other long term (current) drug therapy: Secondary | ICD-10-CM | POA: Insufficient documentation

## 2018-06-14 DIAGNOSIS — O163 Unspecified maternal hypertension, third trimester: Secondary | ICD-10-CM | POA: Diagnosis not present

## 2018-06-14 DIAGNOSIS — R102 Pelvic and perineal pain: Secondary | ICD-10-CM

## 2018-06-14 DIAGNOSIS — N83209 Unspecified ovarian cyst, unspecified side: Secondary | ICD-10-CM | POA: Diagnosis not present

## 2018-06-14 DIAGNOSIS — O26899 Other specified pregnancy related conditions, unspecified trimester: Secondary | ICD-10-CM

## 2018-06-14 DIAGNOSIS — O26893 Other specified pregnancy related conditions, third trimester: Secondary | ICD-10-CM | POA: Diagnosis not present

## 2018-06-14 DIAGNOSIS — Z7982 Long term (current) use of aspirin: Secondary | ICD-10-CM | POA: Diagnosis not present

## 2018-06-14 DIAGNOSIS — Z880 Allergy status to penicillin: Secondary | ICD-10-CM | POA: Diagnosis not present

## 2018-06-14 MED ORDER — CYCLOBENZAPRINE HCL 10 MG PO TABS
10.0000 mg | ORAL_TABLET | Freq: Once | ORAL | Status: AC
  Administered 2018-06-14: 10 mg via ORAL
  Filled 2018-06-14: qty 1

## 2018-06-14 NOTE — MAU Note (Signed)
Urine in lab  ae

## 2018-06-14 NOTE — Discharge Instructions (Signed)

## 2018-06-14 NOTE — MAU Note (Signed)
Pt reports pain all week, which intensified around 2-3 hours ago. Pt describes the pain as stabbing pain in her abdomen and her lower back feels achy. + FM. Denies LOF or vaginal bleeding.

## 2018-06-14 NOTE — MAU Provider Note (Signed)
S:  Chief Complaint: Contractions   First Provider Initiated Contact with Patient 06/14/18 2252      SUBJECTIVE HPI: Tiffany Sanchez is a 22 y.o. G3P1011 who presents to maternity admissions for contraction back and back pain.  [name] is a 22 y.o. female G3P1011 @ [redacted]w[redacted]d  Here in MAU with complaints of contractions that radiate around to her lower back. No bleeding, + fetal movement. Cervix unchanged per RN. Patient requesting something for muscle pain. Says she has been moving and packing recently.   Past Medical History:  Diagnosis Date  . Anemia   . Asthma    no hospitalizations, no recent attacks (since childhood)  . Complication of anesthesia    one sided epidural  . History of gestational hypertension   . Hypertension   . Ovarian cyst   . Vaginal Pap smear, abnormal    Past Surgical History:  Procedure Laterality Date  . THERAPEUTIC ABORTION     Social History   Socioeconomic History  . Marital status: Single    Spouse name: Not on file  . Number of children: Not on file  . Years of education: Not on file  . Highest education level: Not on file  Occupational History  . Not on file  Social Needs  . Financial resource strain: Not on file  . Food insecurity:    Worry: Not on file    Inability: Not on file  . Transportation needs:    Medical: Not on file    Non-medical: Not on file  Tobacco Use  . Smoking status: Never Smoker  . Smokeless tobacco: Never Used  Substance and Sexual Activity  . Alcohol use: No  . Drug use: No  . Sexual activity: Not Currently  Lifestyle  . Physical activity:    Days per week: Not on file    Minutes per session: Not on file  . Stress: Not on file  Relationships  . Social connections:    Talks on phone: Not on file    Gets together: Not on file    Attends religious service: Not on file    Active member of club or organization: Not on file    Attends meetings of clubs or organizations: Not on file    Relationship  status: Not on file  . Intimate partner violence:    Fear of current or ex partner: Not on file    Emotionally abused: Not on file    Physically abused: Not on file    Forced sexual activity: Not on file  Other Topics Concern  . Not on file  Social History Narrative  . Not on file   No current facility-administered medications on file prior to encounter.    Current Outpatient Medications on File Prior to Encounter  Medication Sig Dispense Refill  . aspirin 81 MG chewable tablet Chew by mouth.    . calcium carbonate (TUMS - DOSED IN MG ELEMENTAL CALCIUM) 500 MG chewable tablet Chew 2-3 tablets by mouth daily as needed for indigestion or heartburn.    . famotidine (PEPCID) 20 MG tablet Take 1 tablet (20 mg total) by mouth 2 (two) times daily. 60 tablet 0  . ferrous sulfate 325 (65 FE) MG tablet Take by mouth.    . Prenatal Vit-Fe Fumarate-FA (PRENATAL MULTIVITAMIN) TABS tablet Take 1 tablet by mouth daily at 12 noon.    . valACYclovir (VALTREX) 500 MG tablet Take 1 tablet (500 mg total) by mouth 2 (two) times daily. 60  tablet 0  . folic acid (FOLVITE) 1 MG tablet Take 1 mg by mouth daily.     Allergies  Allergen Reactions  . Penicillins Other (See Comments)    Has patient had a PCN reaction causing immediate rash, facial/tongue/throat swelling, SOB or lightheadedness with hypotension: Unknown Has patient had a PCN reaction causing severe rash involving mucus membranes or skin necrosis: Unknown Has patient had a PCN reaction that required hospitalization: Unknown Has patient had a PCN reaction occurring within the last 10 years: Unknown If all of the above answers are "NO", then may proceed with Cephalosporin use.     ROS:  Review of Systems  I have reviewed patient's Past Medical Hx, Surgical Hx, Family Hx, Social Hx, medications and allergies.   Physical Exam   Patient Vitals for the past 24 hrs:  BP Temp Pulse Resp Height Weight  06/14/18 2240 120/68 - (!) 101 - - -   06/14/18 2220 127/74 - (!) 104 20 - -  06/14/18 2158 (!) 145/72 98.4 F (36.9 C) (!) 113 20 - -  06/14/18 2153 - - - - 5\' 7"  (1.702 m) (!) 330 lb (149.7 kg)   Physical Exam  MDM  ASSESSMENT MSE Complete Flexeril given 10 mg PO Dilation: 1.5 Effacement (%): 50 Cervical Position: Middle Station: Ballotable Exam by:: B Mosca RM  PLAN  Discharge home in stable condition Labor precautions   Rasch, Harolyn RutherfordJennifer I, NP 06/14/2018 10:57 PM

## 2018-06-15 ENCOUNTER — Other Ambulatory Visit: Payer: Self-pay | Admitting: Family Medicine

## 2018-06-15 ENCOUNTER — Observation Stay
Admission: EM | Admit: 2018-06-15 | Discharge: 2018-06-16 | Disposition: A | Discharge status: Home / Self Care | Payer: Medicaid Other | Attending: Certified Nurse Midwife | Admitting: Certified Nurse Midwife

## 2018-06-15 ENCOUNTER — Encounter: Payer: Self-pay | Admitting: Certified Nurse Midwife

## 2018-06-15 DIAGNOSIS — R102 Pelvic and perineal pain: Secondary | ICD-10-CM | POA: Insufficient documentation

## 2018-06-15 DIAGNOSIS — Z880 Allergy status to penicillin: Secondary | ICD-10-CM | POA: Diagnosis not present

## 2018-06-15 DIAGNOSIS — O26893 Other specified pregnancy related conditions, third trimester: Secondary | ICD-10-CM | POA: Diagnosis not present

## 2018-06-15 DIAGNOSIS — Z3A37 37 weeks gestation of pregnancy: Secondary | ICD-10-CM | POA: Insufficient documentation

## 2018-06-15 DIAGNOSIS — O99210 Obesity complicating pregnancy, unspecified trimester: Secondary | ICD-10-CM

## 2018-06-15 DIAGNOSIS — O163 Unspecified maternal hypertension, third trimester: Secondary | ICD-10-CM | POA: Diagnosis not present

## 2018-06-15 DIAGNOSIS — O99013 Anemia complicating pregnancy, third trimester: Secondary | ICD-10-CM | POA: Insufficient documentation

## 2018-06-15 DIAGNOSIS — R109 Unspecified abdominal pain: Secondary | ICD-10-CM | POA: Diagnosis present

## 2018-06-15 DIAGNOSIS — Z9889 Other specified postprocedural states: Secondary | ICD-10-CM | POA: Insufficient documentation

## 2018-06-15 DIAGNOSIS — O10919 Unspecified pre-existing hypertension complicating pregnancy, unspecified trimester: Secondary | ICD-10-CM

## 2018-06-15 NOTE — OB Triage Note (Signed)
Pt states she has been experiencing severe pelvic pain since yesterday that is making walking difficult. Pt denies ctxs, vaginal bleeding, or LOF. Pt states + FM.

## 2018-06-16 ENCOUNTER — Encounter: Payer: Self-pay | Admitting: Obstetrics and Gynecology

## 2018-06-16 DIAGNOSIS — O26893 Other specified pregnancy related conditions, third trimester: Secondary | ICD-10-CM | POA: Diagnosis not present

## 2018-06-16 DIAGNOSIS — O3660X0 Maternal care for excessive fetal growth, unspecified trimester, not applicable or unspecified: Secondary | ICD-10-CM | POA: Insufficient documentation

## 2018-06-16 DIAGNOSIS — R109 Unspecified abdominal pain: Secondary | ICD-10-CM | POA: Diagnosis present

## 2018-06-16 LAB — COMPREHENSIVE METABOLIC PANEL
ALT: 12 U/L (ref 0–44)
AST: 16 U/L (ref 15–41)
Albumin: 3.3 g/dL — ABNORMAL LOW (ref 3.5–5.0)
Alkaline Phosphatase: 95 U/L (ref 38–126)
Anion gap: 11 (ref 5–15)
BUN: 8 mg/dL (ref 6–20)
CO2: 17 mmol/L — ABNORMAL LOW (ref 22–32)
Calcium: 9 mg/dL (ref 8.9–10.3)
Chloride: 109 mmol/L (ref 98–111)
Creatinine, Ser: 0.46 mg/dL (ref 0.44–1.00)
GFR calc Af Amer: >60 mL/min (ref >60)
GFR calc non Af Amer: >60 mL/min (ref >60)
Glucose, Bld: 85 mg/dL (ref 70–99)
Potassium: 3.5 mmol/L (ref 3.5–5.1)
Sodium: 137 mmol/L (ref 135–145)
Total Bilirubin: 0.5 mg/dL (ref 0.3–1.2)
Total Protein: 6.9 g/dL (ref 6.5–8.1)

## 2018-06-16 LAB — CBC
HCT: 31.5 % — ABNORMAL LOW (ref 35.0–47.0)
Hemoglobin: 10.3 g/dL — ABNORMAL LOW (ref 12.0–16.0)
MCH: 25.6 pg — ABNORMAL LOW (ref 26.0–34.0)
MCHC: 32.6 g/dL (ref 32.0–36.0)
MCV: 78.6 fL — ABNORMAL LOW (ref 80.0–100.0)
Platelets: 229 10*3/uL (ref 150–440)
RBC: 4 MIL/uL (ref 3.80–5.20)
RDW: 16.6 % — ABNORMAL HIGH (ref 11.5–14.5)
WBC: 10.3 10*3/uL (ref 3.6–11.0)

## 2018-06-16 LAB — URINALYSIS, COMPLETE (UACMP) WITH MICROSCOPIC
Bacteria, UA: NONE SEEN
Bilirubin Urine: NEGATIVE
Glucose, UA: NEGATIVE mg/dL
Hgb urine dipstick: NEGATIVE
Ketones, ur: NEGATIVE mg/dL
Nitrite: NEGATIVE
Protein, ur: NEGATIVE mg/dL
Specific Gravity, Urine: 1.01 (ref 1.005–1.030)
pH: 7 (ref 5.0–8.0)

## 2018-06-16 LAB — PROTEIN / CREATININE RATIO, URINE
Creatinine, Urine: 96 mg/dL
Protein Creatinine Ratio: 0.08 mg/mg{Cre} (ref 0.00–0.15)
Total Protein, Urine: 8 mg/dL

## 2018-06-16 MED ORDER — ACETAMINOPHEN 325 MG PO TABS: 650.0000 mg | ORAL_TABLET | ORAL | Status: DC | PRN

## 2018-06-16 MED ORDER — ACETAMINOPHEN 500 MG PO TABS
ORAL_TABLET | ORAL | Status: AC
  Filled 2018-06-16: qty 2

## 2018-06-16 MED ORDER — ACETAMINOPHEN 500 MG PO TABS
1000.0000 mg | ORAL_TABLET | Freq: Four times a day (QID) | ORAL | Status: DC | PRN
  Administered 2018-06-16: 1000 mg via ORAL

## 2018-06-16 NOTE — Discharge Summary (Signed)
Tiffany RamusBianca I Sanchez is a 22 y.o. female. She is at 3256w3d gestation. Patient's last menstrual period was 09/27/2017 (approximate). Estimated Date of Delivery: 07/04/18  [facility]  Chief complaint: abdominal/pelvic pain Location: LLQ, lower back Onset/timing: yesterday Duration: constant, but waxes and wanes in intensity Quality: achey, sharp intermittently Severity: 6/10 discomfort at baseline, 10/10 discomfort at its worst Aggravating or alleviating conditions: worse with walking Associated signs/symptoms: occasional Braxton Hicks contractions Context: Tiffany Sanchez reports a LLQ abdominal/L sided pelvic pain that started yesterday. She reports that it is present all of the time, but worse with walking, to the point that it makes it difficult to walk. At times the pain wraps around to her lower back. She was [facility] [place] about 24 hours ago for the same concern. There, she was given 10mg  of Flexeril, which she reports made her sleepy, but did not help with the pain at all. She has not taken anything else to help with the pain. She reports potentially inadequate hydration, drinking about 48 ounces of water a day.   S: Resting comfortably in bed, using cell phone.   She reports:  -active fetal movement -no leakage of fluid  -no vaginal bleeding -no contractions -no chest pain or shortness of breath -no RUQ/epigastric pain  Maternal Medical History:   Past Medical History:  Diagnosis Date  . Anemia   . Asthma    no hospitalizations, no recent attacks (since childhood)  . Complication of anesthesia    one sided epidural  . History of gestational hypertension   . Hypertension   . Ovarian cyst   . Vaginal Pap smear, abnormal     Past Surgical History:  Procedure Laterality Date  . THERAPEUTIC ABORTION      Allergies  Allergen Reactions  . Penicillins Other (See Comments)    Has patient had a PCN reaction causing immediate rash,  facial/tongue/throat swelling, SOB or lightheadedness with hypotension: Unknown Has patient had a PCN reaction causing severe rash involving mucus membranes or skin necrosis: Unknown Has patient had a PCN reaction that required hospitalization: Unknown Has patient had a PCN reaction occurring within the last 10 years: Unknown If all of the above answers are "NO", then may proceed with Cephalosporin use.     Prior to Admission medications   Medication Sig Start Date End Date Taking? Authorizing Provider  aspirin 81 MG chewable tablet Chew by mouth. 12/20/17 12/20/18 Yes [provider]  calcium carbonate (TUMS - DOSED IN MG ELEMENTAL CALCIUM) 500 MG chewable tablet Chew 2-3 tablets by mouth daily as needed for indigestion or heartburn.   Yes [provider]  ferrous sulfate 325 (65 FE) MG tablet Take by mouth. 03/03/18 03/03/19 Yes [provider]  Prenatal Vit-Fe Fumarate-FA (PRENATAL MULTIVITAMIN) TABS tablet Take 1 tablet by mouth daily at 12 noon.   Yes [provider]  valACYclovir (VALTREX) 500 MG tablet Take 1 tablet (500 mg total) by mouth 2 (two) times daily. 06/03/18  Yes [place] BingPickens, Charlie, MD  famotidine (PEPCID) 20 MG tablet Take 1 tablet (20 mg total) by mouth 2 (two) times daily. Patient not taking: Reported on 06/15/2018 09/21/16   Sharman CheekStafford, Phillip, MD  folic acid (FOLVITE) 1 MG tablet Take 1 mg by mouth daily.    [provider]     Social History: She  reports that she has never smoked. She has never used smokeless tobacco. She reports that she does not drink alcohol or use drugs.  Family History: family history includes Diabetes in her paternal grandfather; Hypertension in her father and mother.   Review of Systems: A full review of systems was performed and negative except as noted in the HPI.    O:  BP 128/65   Pulse 100   Temp 98.6 F (37 C) (Oral)   Resp 20   Ht 5\' 7"  (1.702 m)   Wt (!) 149.7 kg (330 lb)   LMP 09/27/2017  (Approximate)   BMI 51.69 kg/m  Results for orders placed or performed during the hospital encounter of 06/15/18 (from the past 48 hour(s))  Protein / creatinine ratio, urine   Collection Time: 06/15/18 11:41 PM  Result Value Ref Range   Creatinine, Urine 96 mg/dL   Total Protein, Urine 8 mg/dL   Protein Creatinine Ratio 0.08 0.00 - 0.15 mg/mg[Cre]  Urinalysis, Complete w Microscopic   Collection Time: 06/15/18 11:41 PM  Result Value Ref Range   Color, Urine YELLOW (A) YELLOW   APPearance HAZY (A) CLEAR   Specific Gravity, Urine 1.010 1.005 - 1.030   pH 7.0 5.0 - 8.0   Glucose, UA NEGATIVE NEGATIVE mg/dL   Hgb urine dipstick NEGATIVE NEGATIVE   Bilirubin Urine NEGATIVE NEGATIVE   Ketones, ur NEGATIVE NEGATIVE mg/dL   Protein, ur NEGATIVE NEGATIVE mg/dL   Nitrite NEGATIVE NEGATIVE   Leukocytes, UA TRACE (A) NEGATIVE   RBC / HPF 0-5 0 - 5 RBC/hpf   WBC, UA 6-10 0 - 5 WBC/hpf   Bacteria, UA NONE SEEN NONE SEEN   Squamous Epithelial / LPF 6-10 0 - 5  Comprehensive metabolic panel   Collection Time: 06/16/18 12:57 AM  Result Value Ref Range   Sodium 137 135 - 145 mmol/L   Potassium 3.5 3.5 - 5.1 mmol/L   Chloride 109 98 - 111 mmol/L   CO2 17 (L) 22 - 32 mmol/L   Glucose, Bld 85 70 - 99 mg/dL   BUN 8 6 - 20 mg/dL   Creatinine, Ser 1.61 0.44 - 1.00 mg/dL   Calcium 9.0 8.9 - 09.6 mg/dL   Total Protein 6.9 6.5 - 8.1 g/dL   Albumin 3.3 (L) 3.5 - 5.0 g/dL   AST 16 15 - 41 U/L   ALT 12 0 - 44 U/L   Alkaline Phosphatase 95 38 - 126 U/L   Total Bilirubin 0.5 0.3 - 1.2 mg/dL   GFR calc non Af Amer >60 >60 mL/min   GFR calc Af Amer >60 >60 mL/min   Anion gap 11 5 - 15  CBC   Collection Time: 06/16/18 12:57 AM  Result Value Ref Range   WBC 10.3 3.6 - 11.0 K/uL   RBC 4.00 3.80 - 5.20 MIL/uL   Hemoglobin 10.3 (L) 12.0 - 16.0 g/dL   HCT 04.5 (L) 40.9 - 81.1 %   MCV 78.6 (L) 80.0 - 100.0 fL   MCH 25.6 (L) 26.0 - 34.0 pg   MCHC 32.6 32.0 - 36.0 g/dL   RDW 91.4 (H) 78.2 - 95.6 %    Platelets 229 150 - 440 K/uL     Constitutional: NAD, AAOx3, appears quite comfortable HE/ENT: extraocular movements grossly intact, moist mucous membranes CV: RRR PULM: nl respiratory effort, CTABL     Abd: gravid, non-tender, non-distended, soft   Back: symmetric, nontender    Ext: Non-tender, Nonedmeatous   Psych: mood appropriate, speech normal Pelvic deferred  NST:  Baseline: 145 Variability: moderate Accelerations: present x >2 Decelerations: absent Time:  Toco: rare contraction  A/P: 22 y.o. [redacted]w[redacted]d here for antenatal surveillance for LLQ abdominal/L sided pelvic pain  Labor  Not present.  Fetal Wellbeing  Reactive NST, reassuring for GA.  Abdominal/pelvic pain  No tenderness to palpation on physical exam.  Treated with PO hydration and 1g PO Tylenol.  No evidence of acute UTI, urine culture sent due to trace leukocytes.  Advised hydration, Tylenol PRN, and pregnancy support band for presumed musculoskeletal discomforts of pregnancy, likely in part to fetal positioning.  Elevated BP readings  Multiple mild range readings, followed by normal range readings after RN switched to appropriate blood pressure cuff size. Patient reports no concerns for elevated blood pressure in this pregnancy.   CBC, CMP, and P/C ratio with no evidence of preeclampsia.  No other signs or symptoms of severe features.  Precautions reviewed.   Anemia  Hemoglobin 10.3  Patient reports currently taking iron supplement BID in addition to prenatal vitamin.  Advised 500mg  Vitamin C BID with iron supplement, along with iron-rich rich foods.  D/c home stable, precautions reviewed, follow-up as scheduled.   Patient reports office appointment scheduled for 06/17/18.   Genia Del 06/16/2018 1:34 AM ----- Genia Del, CNM Certified Nurse Midwife [facility], Department of OB/GYN [facility]

## 2018-06-16 NOTE — Discharge Instructions (Signed)
Please call your providers if you have any of the following symptoms: -Swelling of the face or hands -Headache that will not go away -Blurry vision, white or black spots in your vision, bright lights/stars in your vision that will not go away -Pain on the right, upper side of your abdomen that aches and is not from the baby's feet kicking you -Pain in the upper, middle of your abdomen, directly under your ribcage -Severe nausea or vomiting/unable to keep food or liquids down -Difficulty breathing

## 2018-06-17 ENCOUNTER — Ambulatory Visit (INDEPENDENT_AMBULATORY_CARE_PROVIDER_SITE_OTHER): Payer: Medicaid Other | Admitting: Family Medicine

## 2018-06-17 VITALS — BP 107/74 | HR 92 | Wt 324.6 lb

## 2018-06-17 DIAGNOSIS — O0993 Supervision of high risk pregnancy, unspecified, third trimester: Secondary | ICD-10-CM | POA: Diagnosis not present

## 2018-06-17 DIAGNOSIS — O10919 Unspecified pre-existing hypertension complicating pregnancy, unspecified trimester: Secondary | ICD-10-CM

## 2018-06-17 LAB — URINE CULTURE

## 2018-06-17 NOTE — Patient Instructions (Signed)

## 2018-06-17 NOTE — Progress Notes (Signed)
Patient reports having left side pain for a couple days.

## 2018-06-18 NOTE — Progress Notes (Signed)
   PRENATAL VISIT NOTE  Subjective:  Tiffany Sanchez is a 22 y.o. G3P1011 at 1610w5d being seen today for ongoing prenatal care.  She is currently monitored for the following issues for this high-risk pregnancy and has Chronic hypertension during pregnancy, antepartum; Supervision of high risk pregnancy, antepartum, third trimester; Obesity in pregnancy; Labor and delivery affected by shoulder dystocia; BMI 50.0-59.9, adult (HCC); History of herpes genitalis; Abdominal pain; and LGA (large for gestational age) fetus affecting management of mother on their problem list.  Patient reports no complaints.   .  .  Movement: Present. Denies leaking of fluid.   The following portions of the patient's history were reviewed and updated as appropriate: allergies, current medications, past family history, past medical history, past social history, past surgical history and problem list. Problem list updated.  Objective:   Vitals:   06/17/18 1621  BP: 107/74  Pulse: 92  Weight: (!) 324 lb 9.6 oz (147.2 kg)    Fetal Status: Fetal Heart Rate (bpm): nst   Movement: Present     General:  Alert, oriented and cooperative. Patient is in no acute distress.  Skin: Skin is warm and dry. No rash noted.   Cardiovascular: Normal heart rate noted  Respiratory: Normal respiratory effort, no problems with respiration noted  Abdomen: Soft, gravid, appropriate for gestational age.  Pain/Pressure: Present     Pelvic: Cervical exam deferred        Extremities: Normal range of motion.     Mental Status: Normal mood and affect. Normal behavior. Normal judgment and thought content.   NST:  Baseline: 145 bpm, Variability: Good {> 6 bpm), Accelerations: Reactive and Decelerations: Absent  Assessment and Plan:  Pregnancy: G3P1011 at 910w5d  1. Chronic hypertension during pregnancy, antepartum BP is well controlled on no meds continue ASA continue antepartum fetal surveillance - US Fetal BPP W/O Non Stress; Future  2.  Supervision of high risk pregnancy, antepartum, third trimester C-section is scheduled  Term labor symptoms and general obstetric precautions including but not limited to vaginal bleeding, contractions, leaking of fluid and fetal movement were reviewed in detail with the patient. Please refer to After Visit Summary for other counseling recommendations.  Return in 1 week (on 06/24/2018).  Future Appointments  Date Time Provider Department Center  06/20/2018  9:15 AM [facility] US 4 [facility]-M[facility]US [facility]-US  06/24/2018  1:15 PM Reva BoresPratt, Tayo Maute S, MD [facility]  06/27/2018  9:45 AM [facility] PAT 5 [facility] None    Reva Boresanya S Clerance Umland, MD

## 2018-06-20 ENCOUNTER — Other Ambulatory Visit: Payer: Self-pay | Admitting: Obstetrics & Gynecology

## 2018-06-20 ENCOUNTER — Ambulatory Visit (HOSPITAL_COMMUNITY)
Admission: RE | Admit: 2018-06-20 | Discharge: 2018-06-20 | Disposition: A | Discharge status: Home / Self Care | Payer: Medicaid Other | Source: Ambulatory Visit | Attending: Obstetrics & Gynecology | Admitting: Obstetrics & Gynecology

## 2018-06-20 DIAGNOSIS — Z3A38 38 weeks gestation of pregnancy: Secondary | ICD-10-CM

## 2018-06-20 DIAGNOSIS — O99213 Obesity complicating pregnancy, third trimester: Secondary | ICD-10-CM | POA: Insufficient documentation

## 2018-06-20 DIAGNOSIS — O10913 Unspecified pre-existing hypertension complicating pregnancy, third trimester: Secondary | ICD-10-CM | POA: Insufficient documentation

## 2018-06-20 DIAGNOSIS — O10919 Unspecified pre-existing hypertension complicating pregnancy, unspecified trimester: Secondary | ICD-10-CM

## 2018-06-20 DIAGNOSIS — O24419 Gestational diabetes mellitus in pregnancy, unspecified control: Secondary | ICD-10-CM

## 2018-06-20 DIAGNOSIS — O163 Unspecified maternal hypertension, third trimester: Secondary | ICD-10-CM

## 2018-06-20 DIAGNOSIS — O99210 Obesity complicating pregnancy, unspecified trimester: Secondary | ICD-10-CM

## 2018-06-20 DIAGNOSIS — O10013 Pre-existing essential hypertension complicating pregnancy, third trimester: Secondary | ICD-10-CM | POA: Diagnosis not present

## 2018-06-24 ENCOUNTER — Ambulatory Visit (INDEPENDENT_AMBULATORY_CARE_PROVIDER_SITE_OTHER): Payer: Medicaid Other | Admitting: Family Medicine

## 2018-06-24 VITALS — BP 113/73 | HR 110 | Wt 332.6 lb

## 2018-06-24 DIAGNOSIS — O3663X0 Maternal care for excessive fetal growth, third trimester, not applicable or unspecified: Secondary | ICD-10-CM | POA: Diagnosis not present

## 2018-06-24 DIAGNOSIS — O10913 Unspecified pre-existing hypertension complicating pregnancy, third trimester: Secondary | ICD-10-CM | POA: Diagnosis not present

## 2018-06-24 DIAGNOSIS — O0993 Supervision of high risk pregnancy, unspecified, third trimester: Secondary | ICD-10-CM | POA: Diagnosis not present

## 2018-06-24 DIAGNOSIS — O10919 Unspecified pre-existing hypertension complicating pregnancy, unspecified trimester: Secondary | ICD-10-CM

## 2018-06-24 NOTE — Patient Instructions (Signed)

## 2018-06-24 NOTE — Progress Notes (Signed)
   PRENATAL VISIT NOTE  Subjective:  Tiffany Sanchez is a 22 y.o. G3P1011 at 3757w4d being seen today for ongoing prenatal care.  She is currently monitored for the following issues for this high-risk pregnancy and has Chronic hypertension during pregnancy, antepartum; Supervision of high risk pregnancy, antepartum, third trimester; Obesity in pregnancy; Labor and delivery affected by shoulder dystocia; BMI 50.0-59.9, adult (HCC); History of herpes genitalis; Abdominal pain; and LGA (large for gestational age) fetus affecting management of mother on their problem list.  Patient reports no complaints.  Contractions: Irregular. Vag. Bleeding: None.  Movement: Present. Denies leaking of fluid.   The following portions of the patient's history were reviewed and updated as appropriate: allergies, current medications, past family history, past medical history, past social history, past surgical history and problem list. Problem list updated.  Objective:   Vitals:   06/24/18 1321  BP: 113/73  Pulse: (!) 110  Weight: (!) 332 lb 9.6 oz (150.9 kg)    Fetal Status: Fetal Heart Rate (bpm): NST   Movement: Present     General:  Alert, oriented and cooperative. Patient is in no acute distress.  Skin: Skin is warm and dry. No rash noted.   Cardiovascular: Normal heart rate noted  Respiratory: Normal respiratory effort, no problems with respiration noted  Abdomen: Soft, gravid, appropriate for gestational age.  Pain/Pressure: Present     Pelvic: Cervical exam deferred        Extremities: Normal range of motion.  Edema: Mild pitting, slight indentation  Mental Status: Normal mood and affect. Normal behavior. Normal judgment and thought content.  NST:  Baseline: 140 bpm, Variability: Good {> 6 bpm), Accelerations: Reactive, Decelerations: Variable: moderate and 40 mins. following decel lwith reactive strip and no decels. Assessment and Plan:  Pregnancy: G3P1011 at 457w4d  1. Excessive fetal growth  affecting management of pregnancy in third trimester, single or unspecified fetus For PLTCS  2. Supervision of high risk pregnancy, antepartum, third trimester Continue prenatal care.   3. Labor and delivery affected by shoulder dystocia For PLTCS  4. Chronic hypertension during pregnancy, antepartum BP is well controlled Continue ASA, no meds  Preterm labor symptoms and general obstetric precautions including but not limited to vaginal bleeding, contractions, leaking of fluid and fetal movement were reviewed in detail with the patient. Please refer to After Visit Summary for other counseling recommendations.  Return in about 5 weeks (around 07/29/2018) for pp check.  Future Appointments  Date Time Provider Department Center  06/27/2018  9:45 AM [facility] PAT 5 [facility] None  07/11/2018  9:00 AM [facility] NURSE [facility]  08/01/2018  9:00 AM [place] BingPickens, Charlie, MD [facility]    Reva Boresanya S Simora Dingee, MD

## 2018-06-24 NOTE — Addendum Note (Signed)
Addended by: Cheree DittoGRAHAM, Nickolas Chalfin A on: 06/24/2018 04:51 PM   Modules accepted: Orders

## 2018-06-27 ENCOUNTER — Inpatient Hospital Stay: Admission: RE | Admit: 2018-06-27 | Payer: Medicaid Other | Source: Ambulatory Visit

## 2018-06-27 ENCOUNTER — Encounter (HOSPITAL_COMMUNITY)
Admission: RE | Admit: 2018-06-27 | Discharge: 2018-06-27 | Disposition: A | Discharge status: Home / Self Care | Payer: Medicaid Other | Source: Ambulatory Visit | Attending: Family Medicine | Admitting: Family Medicine

## 2018-06-27 ENCOUNTER — Inpatient Hospital Stay
Admission: RE | Admit: 2018-06-27 | Payer: Medicaid Other | Source: Ambulatory Visit | Admitting: Obstetrics and Gynecology

## 2018-06-27 ENCOUNTER — Encounter: Admission: RE | Payer: Self-pay | Source: Ambulatory Visit

## 2018-06-27 HISTORY — DX: Anemia, unspecified: D64.9

## 2018-06-27 HISTORY — DX: Other complications of anesthesia, initial encounter: T88.59XA

## 2018-06-27 HISTORY — DX: Unspecified abnormal cytological findings in specimens from vagina: R87.629

## 2018-06-27 HISTORY — DX: Unspecified ovarian cyst, unspecified side: N83.209

## 2018-06-27 HISTORY — DX: Personal history of other complications of pregnancy, childbirth and the puerperium: Z87.59

## 2018-06-27 HISTORY — DX: Essential (primary) hypertension: I10

## 2018-06-27 HISTORY — DX: Adverse effect of unspecified anesthetic, initial encounter: T41.45XA

## 2018-06-27 LAB — CBC
HCT: 31 % — ABNORMAL LOW (ref 36.0–46.0)
Hemoglobin: 10.1 g/dL — ABNORMAL LOW (ref 12.0–15.0)
MCH: 26 pg (ref 26.0–34.0)
MCHC: 32.6 g/dL (ref 30.0–36.0)
MCV: 79.7 fL (ref 78.0–100.0)
Platelets: 213 10*3/uL (ref 150–400)
RBC: 3.89 MIL/uL (ref 3.87–5.11)
RDW: 16.1 % — ABNORMAL HIGH (ref 11.5–15.5)
WBC: 8.5 10*3/uL (ref 4.0–10.5)

## 2018-06-27 LAB — RAPID HIV SCREEN (HIV 1/2 AB+AG)
HIV 1/2 Antibodies: NONREACTIVE
HIV-1 P24 Antigen - HIV24: NONREACTIVE

## 2018-06-27 LAB — TYPE AND SCREEN
ABO/RH(D): A POS
Antibody Screen: NEGATIVE

## 2018-06-27 LAB — ABO/RH: ABO/RH(D): A POS

## 2018-06-27 SURGERY — Surgical Case
Anesthesia: Spinal

## 2018-06-27 NOTE — Patient Instructions (Signed)
Renee RamusBianca I Milkovich  06/27/2018   Your procedure is scheduled on:  06/28/2018  Enter through the Main Entrance of [facility] at 0930 AM.  Pick up the phone at the desk and dial [phone]  Call this number if you have problems the morning of surgery:[phone]  Remember:   Do not eat food:(After Midnight) Desps de medianoche.  Do not drink clear liquids: (After Midnight) Desps de medianoche.  Take these medicines the morning of surgery with A SIP OF WATER: may take valtrex   Do not wear jewelry, make-up or nail polish.  Do not wear lotions, powders, or perfumes. Do not wear deodorant.  Do not shave 48 hours prior to surgery.  Do not bring valuables to the hospital.  [facility] is not   responsible for any belongings or valuables brought to the hospital.  Contacts, dentures or bridgework may not be worn into surgery.  Leave suitcase in the car. After surgery it may be brought to your room.  For patients admitted to the hospital, checkout time is 11:00 AM the day of              discharge.    N/A   Please read over the following fact sheets that you were given:   Surgical Site Infection Prevention

## 2018-06-28 ENCOUNTER — Inpatient Hospital Stay (HOSPITAL_COMMUNITY): Payer: Medicaid Other | Admitting: Anesthesiology

## 2018-06-28 ENCOUNTER — Other Ambulatory Visit: Payer: Self-pay

## 2018-06-28 ENCOUNTER — Encounter (HOSPITAL_COMMUNITY)
Admission: RE | Disposition: A | Discharge status: Home / Self Care | Payer: Self-pay | Source: Home / Self Care | Attending: Family Medicine

## 2018-06-28 ENCOUNTER — Encounter (HOSPITAL_COMMUNITY): Payer: Self-pay | Admitting: Anesthesiology

## 2018-06-28 ENCOUNTER — Inpatient Hospital Stay (HOSPITAL_COMMUNITY)
Admission: RE | Admit: 2018-06-28 | Discharge: 2018-06-30 | DRG: 788 | Disposition: A | Discharge status: Home / Self Care | Payer: Medicaid Other | Attending: Family Medicine | Admitting: Family Medicine

## 2018-06-28 DIAGNOSIS — O99214 Obesity complicating childbirth: Secondary | ICD-10-CM | POA: Diagnosis present

## 2018-06-28 DIAGNOSIS — Z98891 History of uterine scar from previous surgery: Secondary | ICD-10-CM

## 2018-06-28 DIAGNOSIS — O26893 Other specified pregnancy related conditions, third trimester: Principal | ICD-10-CM | POA: Diagnosis present

## 2018-06-28 DIAGNOSIS — Z3A39 39 weeks gestation of pregnancy: Secondary | ICD-10-CM

## 2018-06-28 DIAGNOSIS — E669 Obesity, unspecified: Secondary | ICD-10-CM | POA: Diagnosis present

## 2018-06-28 DIAGNOSIS — O1002 Pre-existing essential hypertension complicating childbirth: Secondary | ICD-10-CM

## 2018-06-28 DIAGNOSIS — Z880 Allergy status to penicillin: Secondary | ICD-10-CM

## 2018-06-28 DIAGNOSIS — O3663X0 Maternal care for excessive fetal growth, third trimester, not applicable or unspecified: Secondary | ICD-10-CM | POA: Diagnosis present

## 2018-06-28 LAB — RPR: RPR Ser Ql: NONREACTIVE

## 2018-06-28 SURGERY — Surgical Case
Anesthesia: Spinal | Site: Abdomen | Wound class: Clean Contaminated

## 2018-06-28 MED ORDER — ENOXAPARIN SODIUM 80 MG/0.8ML ~~LOC~~ SOLN
0.5000 mg/kg | SUBCUTANEOUS | Status: DC
  Administered 2018-06-29: 75 mg via SUBCUTANEOUS
  Filled 2018-06-28 (×3): qty 0.8

## 2018-06-28 MED ORDER — ONDANSETRON HCL 4 MG/2ML IJ SOLN
INTRAMUSCULAR | Status: DC | PRN
  Administered 2018-06-28: 4 mg via INTRAVENOUS

## 2018-06-28 MED ORDER — OXYTOCIN 40 UNITS IN LACTATED RINGERS INFUSION - SIMPLE MED: 2.5000 [IU]/h | INTRAVENOUS | Status: AC

## 2018-06-28 MED ORDER — COCONUT OIL OIL: 1.0000 "application " | TOPICAL_OIL | Status: DC | PRN

## 2018-06-28 MED ORDER — DIBUCAINE 1 % RE OINT: 1.0000 "application " | TOPICAL_OINTMENT | RECTAL | Status: DC | PRN

## 2018-06-28 MED ORDER — KETOROLAC TROMETHAMINE 30 MG/ML IJ SOLN
INTRAMUSCULAR | Status: AC
  Filled 2018-06-28: qty 1

## 2018-06-28 MED ORDER — BUPIVACAINE IN DEXTROSE 0.75-8.25 % IT SOLN
INTRATHECAL | Status: DC | PRN
  Administered 2018-06-28: 1.5 mg via INTRATHECAL

## 2018-06-28 MED ORDER — IBUPROFEN 600 MG PO TABS: 600.0000 mg | ORAL_TABLET | Freq: Four times a day (QID) | ORAL | Status: DC | PRN

## 2018-06-28 MED ORDER — ZOLPIDEM TARTRATE 5 MG PO TABS: 5.0000 mg | ORAL_TABLET | Freq: Every evening | ORAL | Status: DC | PRN

## 2018-06-28 MED ORDER — PHENYLEPHRINE 40 MCG/ML (10ML) SYRINGE FOR IV PUSH (FOR BLOOD PRESSURE SUPPORT)
PREFILLED_SYRINGE | INTRAVENOUS | Status: AC
  Filled 2018-06-28: qty 10

## 2018-06-28 MED ORDER — LACTATED RINGERS IV SOLN: 125.0000 mL/h | INTRAVENOUS | Status: DC

## 2018-06-28 MED ORDER — ONDANSETRON HCL 4 MG/2ML IJ SOLN
INTRAMUSCULAR | Status: AC
  Filled 2018-06-28: qty 2

## 2018-06-28 MED ORDER — PHENYLEPHRINE 8 MG IN D5W 100 ML (0.08MG/ML) PREMIX OPTIME
INJECTION | INTRAVENOUS | Status: AC
  Filled 2018-06-28: qty 100

## 2018-06-28 MED ORDER — FENTANYL CITRATE (PF) 100 MCG/2ML IJ SOLN
25.0000 ug | INTRAMUSCULAR | Status: DC | PRN
  Administered 2018-06-28 (×2): 50 ug via INTRAVENOUS

## 2018-06-28 MED ORDER — ACETAMINOPHEN 10 MG/ML IV SOLN
1000.0000 mg | Freq: Four times a day (QID) | INTRAVENOUS | Status: DC
  Administered 2018-06-28: 1000 mg via INTRAVENOUS
  Filled 2018-06-28 (×4): qty 100

## 2018-06-28 MED ORDER — NALBUPHINE HCL 10 MG/ML IJ SOLN: 5.0000 mg | INTRAMUSCULAR | Status: DC | PRN

## 2018-06-28 MED ORDER — LIDOCAINE HCL (PF) 1 % IJ SOLN
INTRAMUSCULAR | Status: AC
  Administered 2018-06-28: 30 mL
  Filled 2018-06-28: qty 30

## 2018-06-28 MED ORDER — PRENATAL MULTIVITAMIN CH
1.0000 | ORAL_TABLET | Freq: Every day | ORAL | Status: DC
  Administered 2018-06-29 – 2018-06-30 (×2): 1 via ORAL
  Filled 2018-06-28 (×2): qty 1

## 2018-06-28 MED ORDER — TETANUS-DIPHTH-ACELL PERTUSSIS 5-2.5-18.5 LF-MCG/0.5 IM SUSP: 0.5000 mL | Freq: Once | INTRAMUSCULAR | Status: DC

## 2018-06-28 MED ORDER — FENTANYL CITRATE (PF) 100 MCG/2ML IJ SOLN
25.0000 ug | INTRAMUSCULAR | Status: DC | PRN
  Administered 2018-06-28: 25 ug via INTRAVENOUS
  Filled 2018-06-28: qty 2

## 2018-06-28 MED ORDER — OXYTOCIN 10 UNIT/ML IJ SOLN
INTRAVENOUS | Status: DC | PRN
  Administered 2018-06-28: 40 [IU] via INTRAVENOUS

## 2018-06-28 MED ORDER — MEPERIDINE HCL 25 MG/ML IJ SOLN: 6.2500 mg | INTRAMUSCULAR | Status: DC | PRN

## 2018-06-28 MED ORDER — FENTANYL CITRATE (PF) 100 MCG/2ML IJ SOLN
INTRAMUSCULAR | Status: AC
  Administered 2018-06-28: 50 ug via INTRAVENOUS
  Filled 2018-06-28: qty 2

## 2018-06-28 MED ORDER — PROMETHAZINE HCL 25 MG/ML IJ SOLN: 6.2500 mg | INTRAMUSCULAR | Status: DC | PRN

## 2018-06-28 MED ORDER — LACTATED RINGERS IV SOLN
INTRAVENOUS | Status: DC | PRN
  Administered 2018-06-28 (×3): via INTRAVENOUS

## 2018-06-28 MED ORDER — SODIUM CHLORIDE 0.9% FLUSH: 3.0000 mL | INTRAVENOUS | Status: DC | PRN

## 2018-06-28 MED ORDER — ACETAMINOPHEN 325 MG PO TABS: 650.0000 mg | ORAL_TABLET | ORAL | Status: DC | PRN

## 2018-06-28 MED ORDER — LACTATED RINGERS IV SOLN: INTRAVENOUS | Status: DC

## 2018-06-28 MED ORDER — WITCH HAZEL-GLYCERIN EX PADS: 1.0000 "application " | MEDICATED_PAD | CUTANEOUS | Status: DC | PRN

## 2018-06-28 MED ORDER — MENTHOL 3 MG MT LOZG: 1.0000 | LOZENGE | OROMUCOSAL | Status: DC | PRN

## 2018-06-28 MED ORDER — FENTANYL CITRATE (PF) 100 MCG/2ML IJ SOLN
INTRAMUSCULAR | Status: AC
  Filled 2018-06-28: qty 2

## 2018-06-28 MED ORDER — OXYCODONE-ACETAMINOPHEN 5-325 MG PO TABS
2.0000 | ORAL_TABLET | ORAL | Status: DC | PRN
  Administered 2018-06-30: 2 via ORAL
  Filled 2018-06-28: qty 2

## 2018-06-28 MED ORDER — SIMETHICONE 80 MG PO CHEW
80.0000 mg | CHEWABLE_TABLET | ORAL | Status: DC
  Administered 2018-06-29: 80 mg via ORAL
  Filled 2018-06-28: qty 1

## 2018-06-28 MED ORDER — DIPHENHYDRAMINE HCL 25 MG PO CAPS: 25.0000 mg | ORAL_CAPSULE | Freq: Four times a day (QID) | ORAL | Status: DC | PRN

## 2018-06-28 MED ORDER — SENNOSIDES-DOCUSATE SODIUM 8.6-50 MG PO TABS
2.0000 | ORAL_TABLET | ORAL | Status: DC
  Administered 2018-06-29 (×2): 2 via ORAL
  Filled 2018-06-28 (×2): qty 2

## 2018-06-28 MED ORDER — LIDOCAINE HCL 1 % IJ SOLN
INTRAMUSCULAR | Status: AC
  Filled 2018-06-28: qty 20

## 2018-06-28 MED ORDER — DIPHENHYDRAMINE HCL 50 MG/ML IJ SOLN: 12.5000 mg | INTRAMUSCULAR | Status: DC | PRN

## 2018-06-28 MED ORDER — MORPHINE SULFATE (PF) 0.5 MG/ML IJ SOLN
INTRAMUSCULAR | Status: DC | PRN
  Administered 2018-06-28: .15 mg via INTRATHECAL

## 2018-06-28 MED ORDER — NALOXONE HCL 4 MG/10ML IJ SOLN
1.0000 ug/kg/h | INTRAVENOUS | Status: DC | PRN
  Filled 2018-06-28: qty 5

## 2018-06-28 MED ORDER — DIPHENHYDRAMINE HCL 25 MG PO CAPS: 25.0000 mg | ORAL_CAPSULE | ORAL | Status: DC | PRN

## 2018-06-28 MED ORDER — SIMETHICONE 80 MG PO CHEW: 80.0000 mg | CHEWABLE_TABLET | ORAL | Status: DC | PRN

## 2018-06-28 MED ORDER — SIMETHICONE 80 MG PO CHEW
80.0000 mg | CHEWABLE_TABLET | Freq: Three times a day (TID) | ORAL | Status: DC
  Administered 2018-06-28 – 2018-06-30 (×6): 80 mg via ORAL
  Filled 2018-06-28 (×6): qty 1

## 2018-06-28 MED ORDER — PHENYLEPHRINE 8 MG IN D5W 100 ML (0.08MG/ML) PREMIX OPTIME
INJECTION | INTRAVENOUS | Status: DC | PRN
  Administered 2018-06-28: 60 ug/min via INTRAVENOUS

## 2018-06-28 MED ORDER — KETOROLAC TROMETHAMINE 30 MG/ML IJ SOLN
30.0000 mg | Freq: Four times a day (QID) | INTRAMUSCULAR | Status: AC
  Administered 2018-06-28: 30 mg via INTRAVENOUS
  Filled 2018-06-28: qty 1

## 2018-06-28 MED ORDER — OXYTOCIN 10 UNIT/ML IJ SOLN
INTRAMUSCULAR | Status: AC
  Filled 2018-06-28: qty 4

## 2018-06-28 MED ORDER — PHENYLEPHRINE 40 MCG/ML (10ML) SYRINGE FOR IV PUSH (FOR BLOOD PRESSURE SUPPORT)
PREFILLED_SYRINGE | INTRAVENOUS | Status: DC | PRN
  Administered 2018-06-28: 80 ug via INTRAVENOUS

## 2018-06-28 MED ORDER — NALBUPHINE HCL 10 MG/ML IJ SOLN
5.0000 mg | Freq: Once | INTRAMUSCULAR | Status: DC | PRN
  Filled 2018-06-28: qty 1

## 2018-06-28 MED ORDER — MORPHINE SULFATE (PF) 0.5 MG/ML IJ SOLN
INTRAMUSCULAR | Status: AC
  Filled 2018-06-28: qty 10

## 2018-06-28 MED ORDER — FENTANYL CITRATE (PF) 100 MCG/2ML IJ SOLN
INTRAMUSCULAR | Status: DC | PRN
  Administered 2018-06-28: 15 ug via INTRATHECAL

## 2018-06-28 MED ORDER — CEFAZOLIN SODIUM 10 G IJ SOLR
INTRAMUSCULAR | Status: AC
  Filled 2018-06-28: qty 3000

## 2018-06-28 MED ORDER — ONDANSETRON HCL 4 MG/2ML IJ SOLN
4.0000 mg | Freq: Three times a day (TID) | INTRAMUSCULAR | Status: DC | PRN
  Administered 2018-06-28: 4 mg via INTRAVENOUS
  Filled 2018-06-28: qty 2

## 2018-06-28 MED ORDER — KETOROLAC TROMETHAMINE 30 MG/ML IJ SOLN: 30.0000 mg | Freq: Four times a day (QID) | INTRAMUSCULAR | Status: AC | PRN

## 2018-06-28 MED ORDER — NALBUPHINE HCL 10 MG/ML IJ SOLN
5.0000 mg | INTRAMUSCULAR | Status: DC | PRN
  Administered 2018-06-28 – 2018-06-29 (×2): 5 mg via INTRAVENOUS
  Filled 2018-06-28: qty 1

## 2018-06-28 MED ORDER — SCOPOLAMINE 1 MG/3DAYS TD PT72
1.0000 | MEDICATED_PATCH | Freq: Once | TRANSDERMAL | Status: DC
  Filled 2018-06-28: qty 1

## 2018-06-28 MED ORDER — NALOXONE HCL 0.4 MG/ML IJ SOLN: 0.4000 mg | INTRAMUSCULAR | Status: DC | PRN

## 2018-06-28 MED ORDER — CEFAZOLIN SODIUM-DEXTROSE 2-4 GM/100ML-% IV SOLN: 2.0000 g | INTRAVENOUS | Status: DC

## 2018-06-28 MED ORDER — NALBUPHINE HCL 10 MG/ML IJ SOLN: 5.0000 mg | Freq: Once | INTRAMUSCULAR | Status: DC | PRN

## 2018-06-28 MED ORDER — OXYCODONE-ACETAMINOPHEN 5-325 MG PO TABS
1.0000 | ORAL_TABLET | ORAL | Status: DC | PRN
  Administered 2018-06-29 – 2018-06-30 (×2): 1 via ORAL
  Filled 2018-06-28 (×2): qty 1

## 2018-06-28 MED ORDER — KETOROLAC TROMETHAMINE 30 MG/ML IJ SOLN
30.0000 mg | Freq: Four times a day (QID) | INTRAMUSCULAR | Status: AC | PRN
  Administered 2018-06-28: 30 mg via INTRAVENOUS

## 2018-06-28 MED ORDER — IBUPROFEN 600 MG PO TABS
600.0000 mg | ORAL_TABLET | Freq: Four times a day (QID) | ORAL | Status: DC
  Administered 2018-06-29 – 2018-06-30 (×6): 600 mg via ORAL
  Filled 2018-06-28 (×6): qty 1

## 2018-06-28 MED ORDER — DEXTROSE 5 % IV SOLN
3.0000 g | INTRAVENOUS | Status: DC
  Filled 2018-06-28: qty 3000

## 2018-06-28 SURGICAL SUPPLY — 31 items
BENZOIN TINCTURE PRP APPL 2/3 (GAUZE/BANDAGES/DRESSINGS) ×3 IMPLANT
CHLORAPREP W/TINT 26ML (MISCELLANEOUS) ×3 IMPLANT
CLAMP CORD UMBIL (MISCELLANEOUS) IMPLANT
CLOSURE WOUND 1/2 X4 (GAUZE/BANDAGES/DRESSINGS) ×1
CLOTH BEACON ORANGE TIMEOUT ST (SAFETY) ×3 IMPLANT
DRSG OPSITE POSTOP 4X10 (GAUZE/BANDAGES/DRESSINGS) ×3 IMPLANT
ELECT REM PT RETURN 9FT ADLT (ELECTROSURGICAL) ×3
ELECTRODE REM PT RTRN 9FT ADLT (ELECTROSURGICAL) ×1 IMPLANT
EXTRACTOR VACUUM M CUP 4 TUBE (SUCTIONS) IMPLANT
EXTRACTOR VACUUM M CUP 4' TUBE (SUCTIONS)
GLOVE BIOGEL PI IND STRL 7.0 (GLOVE) ×2 IMPLANT
GLOVE BIOGEL PI IND STRL 7.5 (GLOVE) ×2 IMPLANT
GLOVE BIOGEL PI INDICATOR 7.0 (GLOVE) ×4
GLOVE BIOGEL PI INDICATOR 7.5 (GLOVE) ×4
GLOVE ECLIPSE 7.5 STRL STRAW (GLOVE) ×3 IMPLANT
GOWN STRL REUS W/TWL LRG LVL3 (GOWN DISPOSABLE) ×9 IMPLANT
KIT ABG SYR 3ML LUER SLIP (SYRINGE) IMPLANT
NEEDLE HYPO 25X5/8 SAFETYGLIDE (NEEDLE) IMPLANT
NS IRRIG 1000ML POUR BTL (IV SOLUTION) ×3 IMPLANT
PACK C SECTION WH (CUSTOM PROCEDURE TRAY) ×3 IMPLANT
PAD OB MATERNITY 4.3X12.25 (PERSONAL CARE ITEMS) ×3 IMPLANT
PENCIL SMOKE EVAC W/HOLSTER (ELECTROSURGICAL) ×3 IMPLANT
RTRCTR C-SECT PINK 25CM LRG (MISCELLANEOUS) ×3 IMPLANT
STRIP CLOSURE SKIN 1/2X4 (GAUZE/BANDAGES/DRESSINGS) ×2 IMPLANT
SUT VIC AB 0 CTX 36 (SUTURE) ×6
SUT VIC AB 0 CTX36XBRD ANBCTRL (SUTURE) ×3 IMPLANT
SUT VIC AB 2-0 CT1 27 (SUTURE) ×2
SUT VIC AB 2-0 CT1 TAPERPNT 27 (SUTURE) ×1 IMPLANT
SUT VIC AB 4-0 KS 27 (SUTURE) ×3 IMPLANT
TOWEL OR 17X24 6PK STRL BLUE (TOWEL DISPOSABLE) ×3 IMPLANT
TRAY FOLEY W/BAG SLVR 14FR LF (SET/KITS/TRAYS/PACK) ×3 IMPLANT

## 2018-06-28 NOTE — Op Note (Signed)
Tiffany Sanchez PROCEDURE DATE: 06/28/2018  PREOPERATIVE DIAGNOSIS: Intrauterine pregnancy at  3549w1d weeks gestation; previous shoulder dystocia  POSTOPERATIVE DIAGNOSIS: The same  PROCEDURE: Primary Low Transverse Cesarean Section  SURGEON:  Dr. Candelaria CelesteJacob Stinson  ASSISTANT:   INDICATIONS: Tiffany RamusBianca I Sanchez is a 22 y.o. G3P1011 at 6649w1d scheduled for cesarean section secondary to previous shoulder dystocia.  The risks of cesarean section discussed with the patient included but were not limited to: bleeding which may require transfusion or reoperation; infection which may require antibiotics; injury to bowel, bladder, ureters or other surrounding organs; injury to the fetus; need for additional procedures including hysterectomy in the event of a life-threatening hemorrhage; placental abnormalities wth subsequent pregnancies, incisional problems, thromboembolic phenomenon and other postoperative/anesthesia complications. The patient concurred with the proposed plan, giving informed written consent for the procedure.    FINDINGS:  Viable female infant in vertex presentation.  Apgars 9 and 9, weight pending.  Clear amniotic fluid.  Intact placenta, three vessel cord.  Normal uterus, fallopian tubes and ovaries bilaterally.  ANESTHESIA:    Spinal INTRAVENOUS FLUIDS:2000 ml ESTIMATED BLOOD LOSS: 520 ml URINE OUTPUT:  100 ml SPECIMENS: Placenta sent to L&D COMPLICATIONS: None immediate  PROCEDURE IN DETAIL:  The patient received intravenous antibiotics and had sequential compression devices applied to her lower extremities while in the preoperative area.  She was then taken to the operating room where spinal anesthesia was administered and was found to be adequate. She was then placed in a dorsal supine position with a leftward tilt, and prepped and draped in a sterile manner.  A foley catheter was placed into her bladder and attached to constant gravity, which drained clear fluid throughout.  After an  adequate timeout was performed, a Pfannenstiel skin incision was made with scalpel and carried through to the underlying layer of fascia. The fascia was incised in the midline and this incision was extended bilaterally using the Mayo scissors. Kocher clamps were applied to the superior aspect of the fascial incision and the underlying rectus muscles were dissected off bluntly. A similar process was carried out on the inferior aspect of the facial incision. The rectus muscles were separated in the midline bluntly and the peritoneum was entered bluntly. An Alexis retractor was placed to aid in visualization of the uterus.  Attention was turned to the lower uterine segment where a transverse hysterotomy was made with a scalpel and extended bilaterally bluntly. The infant was successfully delivered, and cord was clamped and cut and infant was handed over to awaiting neonatology team. Uterine massage was then administered and the placenta delivered intact with three-vessel cord. The uterus was then cleared of clot and debris.  The hysterotomy was closed with 0 Vicryl in a running locked fashion, and an imbricating layer was also placed with a 0 Vicryl. Overall, excellent hemostasis was noted. The abdomen and the pelvis were cleared of all clot and debris and the Tiffany Sanchez was removed. Hemostasis was confirmed on all surfaces.  The peritoneum was reapproximated using 2-0 vicryl running stitches. The fascia was then closed using 0 Vicryl in a running fashion. The subcutaneous layer was reapproximated with plain gut and the skin was closed with 4-0 vicryl. The patient tolerated the procedure well. Sponge, lap, instrument and needle counts were correct x 2. She was taken to the recovery room in stable condition.    Levie HeritageStinson, Jacob J, DO 06/28/2018 1:15 PM

## 2018-06-28 NOTE — Lactation Note (Signed)
This note was copied from a baby's chart. Lactation Consultation Note  Patient Name: Boy Jeanella CaraBianca Tapanes ZDGUY'QToday's Date: 06/28/2018 Reason for consult: Initial assessment Refreral from RN .  Can't get baby to latch and mom wants to give formula.Mom reports breastfeed for a few weeks and formula fed with last baby.  Approximately 2 weeks.  Mom with large breasts and large nipples. Assist with breastfeeding.  Infant comes off and on in football.  Holds nipple in mouth.  Infant breastfed about 2 moinutes and then just held in mouth.  Able to hand express and spoon feed back 5 ml of EBM.  Visitors in room entire time.  More visitors came in room.  Mom did not want to attempt to breastfeed any more.   Maternal Data Has patient been taught Hand Expression?: Yes Does the patient have breastfeeding experience prior to this delivery?: Yes  Feeding Feeding Type: Breast Fed Length of feed: 2 min  LATCH Score                   Interventions    Lactation Tools Discussed/Used     Consult Status      Robynne Roat Michaelle CopasS Noel Rodier 06/28/2018, 6:26 PM

## 2018-06-28 NOTE — Anesthesia Procedure Notes (Signed)
Spinal  Patient location during procedure: OR Start time: 06/28/2018 12:02 PM End time: 06/28/2018 12:04 PM Staffing Anesthesiologist: Effie Berkshire, MD Performed: anesthesiologist  Preanesthetic Checklist Completed: patient identified, site marked, surgical consent, pre-op evaluation, timeout performed, IV checked, risks and benefits discussed and monitors and equipment checked Spinal Block Patient position: sitting Prep: DuraPrep Patient monitoring: heart rate, continuous pulse ox, blood pressure and cardiac monitor Approach: midline Location: L4-5 Injection technique: single-shot Needle Needle type: Introducer and Pencan  Needle gauge: 24 G Needle length: 9 cm Additional Notes Negative paresthesia. Negative blood return. Positive free-flowing CSF. Expiration date of kit checked and confirmed. Patient tolerated procedure well, without complications.

## 2018-06-28 NOTE — Anesthesia Postprocedure Evaluation (Signed)
Anesthesia Post Note  Patient: Tiffany RamusBianca I Sanchez  Procedure(s) Performed: CESAREAN SECTION (N/A Abdomen)     Patient location during evaluation: PACU Anesthesia Type: Spinal Level of consciousness: oriented and awake and alert Pain management: pain level controlled Vital Signs Assessment: post-procedure vital signs reviewed and stable Respiratory status: spontaneous breathing, respiratory function stable and patient connected to nasal cannula oxygen Cardiovascular status: blood pressure returned to baseline and stable Postop Assessment: no headache, no backache, no apparent nausea or vomiting, spinal receding and patient able to bend at knees Anesthetic complications: no    Last Vitals:  Vitals:   06/28/18 1355 06/28/18 1400  BP:  117/79  Pulse: (!) 107 86  Resp: (!) 30 18  Temp:    SpO2: 100% 100%     Pain Goal:                 Shelton SilvasKevin D Czarina Gingras

## 2018-06-28 NOTE — Transfer of Care (Signed)
Immediate Anesthesia Transfer of Care Note  Patient: Tiffany RamusBianca I Boyland  Procedure(s) Performed: CESAREAN SECTION (N/A Abdomen)  Patient Location: PACU  Anesthesia Type:Spinal  Level of Consciousness: awake  Airway & Oxygen Therapy: Patient Spontanous Breathing  Post-op Assessment: Report given to RN  Post vital signs: Reviewed and stable  Last Vitals:  Vitals Value Taken Time  BP    Temp    Pulse    Resp    SpO2      Last Pain:  Vitals:   06/28/18 0947  TempSrc: Oral         Complications: No apparent anesthesia complications

## 2018-06-28 NOTE — Anesthesia Preprocedure Evaluation (Addendum)
Anesthesia Evaluation  Patient identified by MRN, date of birth, ID band Patient awake    Reviewed: Allergy & Precautions, NPO status , Patient's Chart, lab work & pertinent test results  Airway Mallampati: II  TM Distance: >3 FB Neck ROM: Full    Dental  (+) Teeth Intact, Dental Advisory Given   Pulmonary asthma ,    breath sounds clear to auscultation       Cardiovascular hypertension,  Rhythm:Regular Rate:Normal     Neuro/Psych negative neurological ROS  negative psych ROS   GI/Hepatic Neg liver ROS, GERD  Medicated,  Endo/Other  negative endocrine ROS  Renal/GU negative Renal ROS     Musculoskeletal   Abdominal (+) + obese,   Peds  Hematology   Anesthesia Other Findings   Reproductive/Obstetrics (+) Pregnancy                            Anesthesia Physical Anesthesia Plan  ASA: III  Anesthesia Plan: Spinal   Post-op Pain Management:    Induction:   PONV Risk Score and Plan:   Airway Management Planned: Natural Airway  Additional Equipment: None  Intra-op Plan:   Post-operative Plan:   Informed Consent: I have reviewed the patients History and Physical, chart, labs and discussed the procedure including the risks, benefits and alternatives for the proposed anesthesia with the patient or authorized representative who has indicated his/her understanding and acceptance.     Plan Discussed with: CRNA  Anesthesia Plan Comments: (Lab Results      Component                Value               Date                      WBC                      8.5                 06/27/2018                HGB                      10.1 (L)            06/27/2018                HCT                      31.0 (L)            06/27/2018                MCV                      79.7                06/27/2018                PLT                      213                 06/27/2018           )        Anesthesia Quick Evaluation

## 2018-06-28 NOTE — H&P (Signed)
Faculty Practice H&P  Renee RamusBianca I Croucher is a 22 y.o. female G3P1011 with IUP at 2179w1d presenting for primary cesarean section for previous shoulder dystocia. Pregnancy was been complicated by obesity, CHTN, LGA.    Pt states she has been having no contractions, no vaginal bleeding, intact membranes, with normal fetal movement.     Prenatal Course Source of Care: [facility] with onset of care at 35 weeks. She was transferred to our office from a practice in [place] because of her BMI  Pregnancy complications or risks: Patient Active Problem List   Diagnosis Date Noted  . Abdominal pain 06/16/2018  . LGA (large for gestational age) fetus affecting management of mother 06/16/2018  . BMI 50.0-59.9, adult (HCC) 06/03/2018  . History of herpes genitalis 06/03/2018  . Supervision of high risk pregnancy, antepartum, third trimester 05/30/2018  . Obesity in pregnancy 05/30/2018  . Labor and delivery affected by shoulder dystocia 05/30/2018  . Chronic hypertension during pregnancy, antepartum 12/13/2017   She desires nexplanon for contraception.  She plans to breastfeed  Prenatal labs and studies: ABO, Rh: --/--/A POS, A POS Performed at [facility], [address] [place], [place] [address]  [phone](08/09 1006) Antibody: NEG (08/09 1006) Rubella: Immune (02/01 0000) RPR: Non Reactive (08/09 1006)  HBsAg: Negative (02/01 0000)  HIV: NON REACTIVE (08/09 1006)  GBS:    2hr Glucola: negative Genetic screening: normal Anatomy US: normal  Past Medical History:  Past Medical History:  Diagnosis Date  . Anemia   . Asthma    no hospitalizations, no recent attacks (since childhood)  . Complication of anesthesia    one sided epidural  . History of gestational hypertension   . Hypertension   . Ovarian cyst   . Vaginal Pap smear, abnormal     Past Surgical History:  Past Surgical History:  Procedure Laterality Date  . THERAPEUTIC ABORTION      Obstetrical History:  OB History    Gravida  3   Para  1   Term  1   Preterm      AB  1   Living  1     SAB      TAB      Ectopic      Multiple  0   Live Births  1           Gynecological History:  OB History    Gravida  3   Para  1   Term  1   Preterm      AB  1   Living  1     SAB      TAB      Ectopic      Multiple  0   Live Births  1           Social History:  Social History   Socioeconomic History  . Marital status: Single    Spouse name: Not on file  . Number of children: Not on file  . Years of education: Not on file  . Highest education level: Not on file  Occupational History  . Not on file  Social Needs  . Financial resource strain: Not on file  . Food insecurity:    Worry: Not on file    Inability: Not on file  . Transportation needs:    Medical: Not on file    Non-medical: Not on file  Tobacco Use  . Smoking status: Never Smoker  . Smokeless tobacco: Never Used  Substance and Sexual Activity  . Alcohol use: No  . Drug use: No  . Sexual activity: Not Currently  Lifestyle  . Physical activity:    Days per week: Not on file    Minutes per session: Not on file  . Stress: Not on file  Relationships  . Social connections:    Talks on phone: Not on file    Gets together: Not on file    Attends religious service: Not on file    Active member of club or organization: Not on file    Attends meetings of clubs or organizations: Not on file    Relationship status: Not on file  Other Topics Concern  . Not on file  Social History Narrative  . Not on file    Family History:  Family History  Problem Relation Age of Onset  . Hypertension Mother   . Hypertension Father   . Diabetes Paternal Grandfather     Medications:  Prenatal vitamins,  Current Facility-Administered Medications  Medication Dose Route Frequency Provider Last Rate Last Dose  . ceFAZolin (ANCEF) IVPB 2g/100 mL premix  2 g Intravenous On Call to OR Levie Heritage, DO      .  lactated ringers infusion  125 mL/hr Intravenous Continuous Stinson, Jacob J, DO      . lidocaine (PF) (XYLOCAINE) 1 % injection            Facility-Administered Medications Ordered in Other Encounters  Medication Dose Route Frequency Provider Last Rate Last Dose  . lactated ringers infusion    Continuous PRN Algis Greenhouse, CRNA        Allergies:  Allergies  Allergen Reactions  . Penicillins Other (See Comments)    Unknown reaction-childhood allergy  Has patient had a PCN reaction causing immediate rash, facial/tongue/throat swelling, SOB or lightheadedness with hypotension: Unknown Has patient had a PCN reaction causing severe rash involving mucus membranes or skin necrosis: Unknown Has patient had a PCN reaction that required hospitalization: Unknown Has patient had a PCN reaction occurring within the last 10 years: Unknown If all of the above answers are "NO", then may proceed with Cephalosporin use.     Review of Systems: - negative  Physical Exam: Blood pressure 139/85, pulse 95, temperature 98.5 F (36.9 C), temperature source Oral, resp. rate 16, height 5\' 7"  (1.702 m), weight (!) 151.1 kg, last menstrual period 09/27/2017, unknown if currently breastfeeding. GENERAL: Well-developed, well-nourished female in no acute distress.  LUNGS: Clear to auscultation bilaterally.  HEART: Regular rate and rhythm. ABDOMEN: Soft, nontender, nondistended, gravid. EFW 9.5 lbs EXTREMITIES: Nontender, no edema, 2+ distal pulses. FHT:  Baseline rate 155 bpm      Pertinent Labs/Studies:   Lab Results  Component Value Date   WBC 8.5 06/27/2018   HGB 10.1 (L) 06/27/2018   HCT 31.0 (L) 06/27/2018   MCV 79.7 06/27/2018   PLT 213 06/27/2018    Assessment : SCOTLYN MCCRANIE is a 22 y.o. G3P1011 at [redacted]w[redacted]d being admitted for cesarean section secondary to shoulder dystocia  Plan: The risks of cesarean section discussed with the patient included but were not limited to: bleeding which may  require transfusion or reoperation; infection which may require antibiotics; injury to bowel, bladder, ureters or other surrounding organs; injury to the fetus; need for additional procedures including hysterectomy in the event of a life-threatening hemorrhage; placental abnormalities wth subsequent pregnancies, incisional problems, thromboembolic phenomenon and other postoperative/anesthesia complications. The patient concurred with the proposed plan, giving  informed written consent for the procedure.   Patient has been NPO since last night and will remain NPO for procedure.  Preoperative prophylactic Ancef ordered on call to the OR.    Levie Heritage, DO 06/28/2018, 11:05 AM

## 2018-06-29 LAB — CBC
HCT: 27.7 % — ABNORMAL LOW (ref 36.0–46.0)
Hemoglobin: 9 g/dL — ABNORMAL LOW (ref 12.0–15.0)
MCH: 25.9 pg — ABNORMAL LOW (ref 26.0–34.0)
MCHC: 32.5 g/dL (ref 30.0–36.0)
MCV: 79.6 fL (ref 78.0–100.0)
Platelets: 185 10*3/uL (ref 150–400)
RBC: 3.48 MIL/uL — ABNORMAL LOW (ref 3.87–5.11)
RDW: 16 % — ABNORMAL HIGH (ref 11.5–15.5)
WBC: 10.3 10*3/uL (ref 4.0–10.5)

## 2018-06-29 LAB — CREATININE, SERUM
Creatinine, Ser: 0.74 mg/dL (ref 0.44–1.00)
GFR calc Af Amer: >60 mL/min (ref >60)
GFR calc non Af Amer: >60 mL/min (ref >60)

## 2018-06-29 NOTE — Lactation Note (Signed)
This note was copied from a baby's chart. Lactation Consultation Note  Patient Name: Boy Jeanella CaraBianca Bauserman OZHYQ'MToday's Date: 06/29/2018 Reason for consult: Follow-up assessment;Term  P2 mother whose infant is now 3925 hours old.  Mother has just recently begun pumping with the DEBP.  Baby sleeping on mother's chest as I arrived.  Father in chair sleeping and mother stated that she did not have any questions for me.  She said, "I just want to rest before everyone comes."   I kept my visit short but praised her for starting to pump.  Reviewed breastfeeding, pumping after feeding and doing hand expression before and after feedings.  I offered my assistance to help with latching the next time baby shows cues.  Mother will call me as needed.     Maternal Data Formula Feeding for Exclusion: No Has patient been taught Hand Expression?: Yes Does the patient have breastfeeding experience prior to this delivery?: Yes  Feeding    LATCH Score                   Interventions    Lactation Tools Discussed/Used     Consult Status Consult Status: Follow-up Date: 06/30/18 Follow-up type: In-patient    Delno Blaisdell R Darell Saputo 06/29/2018, 1:56 PM

## 2018-06-29 NOTE — Progress Notes (Signed)
Subjective: Postpartum Day 1: Cesarean Delivery Patient reports incisional pain, tolerating PO and no problems voiding.    Objective: Vital signs in last 24 hours: Temp:  [97.7 F (36.5 C)-99.1 F (37.3 C)] 98.2 F (36.8 C) (08/11 0414) Pulse Rate:  [81-131] 98 (08/11 0414) Resp:  [12-30] 18 (08/11 0414) BP: (88-146)/(52-92) 112/68 (08/11 0414) SpO2:  [89 %-100 %] 96 % (08/11 0414) Weight:  [151.1 kg] 151.1 kg (08/10 0947)  Physical Exam:  General: alert, cooperative and no distress Lochia: appropriate Uterine Fundus: firm Incision: healing well, no significant drainage, no dehiscence, no significant erythema DVT Evaluation: No evidence of DVT seen on physical exam. Negative Homan's sign. No cords or calf tenderness. No significant calf/ankle edema.  Recent Labs    06/27/18 1006 06/29/18 0616  HGB 10.1* 9.0*  HCT 31.0* 27.7*    Assessment/Plan: Status post Cesarean section. Doing well postoperatively.  Continue current care. Possible discharge to home tomorrow.  Levie HeritageJacob J Genevieve Arbaugh 06/29/2018, 7:17 AM

## 2018-06-30 MED ORDER — OXYCODONE-ACETAMINOPHEN 5-325 MG PO TABS: 1.0000 | ORAL_TABLET | ORAL | 0 refills | Status: DC | PRN

## 2018-06-30 MED ORDER — IBUPROFEN 600 MG PO TABS: 600.0000 mg | ORAL_TABLET | Freq: Four times a day (QID) | ORAL | 0 refills | Status: DC

## 2018-06-30 NOTE — Discharge Summary (Signed)
OB Discharge Summary     Patient Name: Tiffany Sanchez DOB: 11/23/1995 MRN: [id number]  Date of admission: 06/28/2018 Delivering MD: Levie HeritageSTINSON, JACOB J   Date of discharge: 06/30/2018  Admitting diagnosis: Primary CS Hx of Should Dystocia Intrauterine pregnancy: 2257w3d     Secondary diagnosis:  Active Problems:   Status post cesarean section  Additional problems: obesity Body mass index is 52.17 kg/m.      Discharge diagnosis: Term Pregnancy Delivered    S./p cesarean section primary low transverse                                                                                            Post partum procedures:  Augmentation:   Complications: None  Hospital course:  Sceduled C/S   22 y.o. yo G3P1011 at 6757w3d was admitted to the hospital 06/28/2018 for scheduled cesarean section with the following indication:Macrosomia. \ Tiffany Sanchez is a 22 y.o. female G3P1011 with IUP at 4369w1d presenting for primary cesarean section for previous shoulder dystocia. Pregnancy was been complicated by obesity, CHTN, LGA.    Pt states she has been having no contractions, no vaginal bleeding, intact membranes, with normal fetal movement.     Prenatal Course Source of Care: [facility] with onset of care at 35 weeks. She was transferred to our office from [place] practice in [place] because of her BMI  Membrane Rupture Time/Date: 12:28 PM ,06/28/2018   Patient delivered a Viable infant.06/28/2018  Details of operation can be found in separate operative note.  Pateint had an uncomplicated postpartum course.  She is ambulating, tolerating a regular diet, passing flatus, and urinating well. Patient is discharged home in stable condition on  06/30/18         Physical exam  Vitals:   06/29/18 1948 06/29/18 2318 06/30/18 0404 06/30/18 0820  BP: 140/67 (!) 117/57 106/63 (!) 119/58  Pulse: (!) 107 96 92 85  Resp: 19 18 20 18   Temp: 98.7 F (37.1 C) 98.1 F (36.7 C) 98.5 F (36.9 C) 99 F (37.2 C)   TempSrc: Oral Oral Oral Oral  SpO2: 99% 99% 100% 100%  Weight:      Height:       General: alert, cooperative and no distress Lochia: appropriate Uterine Fundus: firm Incision: Healing well with no significant drainage DVT Evaluation: No evidence of DVT seen on physical exam. Labs: Lab Results  Component Value Date   WBC 10.3 06/29/2018   HGB 9.0 (L) 06/29/2018   HCT 27.7 (L) 06/29/2018   MCV 79.6 06/29/2018   PLT 185 06/29/2018   CMP Latest Ref Rng & Units 06/29/2018  Glucose 70 - 99 mg/dL -  BUN 6 - 20 mg/dL -  Creatinine 4.090.44 - 8.111.00 mg/dL 9.140.74  Sodium 782135 - 956145 mmol/L -  Potassium 3.5 - 5.1 mmol/L -  Chloride 98 - 111 mmol/L -  CO2 22 - 32 mmol/L -  Calcium 8.9 - 10.3 mg/dL -  Total Protein 6.5 - 8.1 g/dL -  Total Bilirubin 0.3 - 1.2 mg/dL -  Alkaline Phos 38 - 213126 U/L -  AST 15 - 41  U/L -  ALT 0 - 44 U/L -    Discharge instruction: per After Visit Summary and "Baby and Me Booklet".  After visit meds:  Allergies as of 06/30/2018      Reactions   Penicillins Other (See Comments)   Unknown reaction-childhood allergy  Has patient had a PCN reaction causing immediate rash, facial/tongue/throat swelling, SOB or lightheadedness with hypotension: Unknown Has patient had a PCN reaction causing severe rash involving mucus membranes or skin necrosis: Unknown Has patient had a PCN reaction that required hospitalization: Unknown Has patient had a PCN reaction occurring within the last 10 years: Unknown If all of the above answers are "NO", then may proceed with Cephalosporin use.      Medication List    STOP taking these medications   acetaminophen 325 MG tablet Commonly known as:  TYLENOL   aspirin 81 MG chewable tablet   calcium carbonate 500 MG chewable tablet Commonly known as:  TUMS - dosed in mg elemental calcium   famotidine 20 MG tablet Commonly known as:  PEPCID     TAKE these medications   ferrous sulfate 325 (65 FE) MG tablet Take 325 mg by mouth 2  (two) times daily.   ibuprofen 600 MG tablet Commonly known as:  ADVIL,MOTRIN Take 1 tablet (600 mg total) by mouth every 6 (six) hours.   oxyCODONE-acetaminophen 5-325 MG tablet Commonly known as:  PERCOCET/ROXICET Take 1 tablet by mouth every 4 (four) hours as needed (pain scale 4-7).   prenatal multivitamin Tabs tablet Take 1 tablet by mouth daily.   valACYclovir 500 MG tablet Commonly known as:  VALTREX Take 1 tablet (500 mg total) by mouth 2 (two) times daily. What changed:  when to take this       Diet: routine diet  Activity: Advance as tolerated. Pelvic rest for 6 weeks.   Outpatient follow up:2 weeks incision check Follow up Appt: Future Appointments  Date Time Provider Department Center  07/11/2018  9:00 AM [facility] NURSE [facility]  08/01/2018  9:00 AM [place] BingPickens, Charlie, MD [facility]   Follow up Visit:No follow-ups on file.  Postpartum contraception: Not Discussed  Newborn Data: Live born female  Birth Weight: 6 lb 14.9 oz (3145 g) APGAR: 9, 9  Newborn Delivery   Birth date/time:  06/28/2018 12:29:00 Delivery type:  C-Section, Low Transverse Trial of labor:  No C-section categorization:  Primary     Baby Feeding: Breast Disposition:home with mother   06/30/2018 Tilda BurrowJohn V Mikeisha Lemonds, MD

## 2018-06-30 NOTE — Lactation Note (Signed)
This note was copied from a baby's chart. Lactation Consultation Note  Patient Name: Tiffany Sanchez ZOXWR'UToday's Date: 06/30/2018 Reason for consult: Follow-up assessment;Term  P1 mother whose infant is now 2147 hours old.  Mother has been bottle feeding only since 8/10.  Reviewed feeding plan and formula amounts with parents.  Reminded parents to increase formula to 30-60 mls/feed when baby turns 48 hours.  Discussed engorgement prevention/treatment with mother.  After my discussion mother began asking about how to get baby to breastfeed when she goes home.  She is now considering putting baby to breast.  I informed her of milk CTV and if she has not been breastfeeding/pumping she needs to begin as soon as possible.  She stated she wants to consider this when she gets home.  I emphasized the importance of feeding baby 8-12 times/24 hours or earlier if he shows feeding cues.  If he does not latch well she will also need to pump with DEBP after breastfeeding for 15 minutes.  Encouraged hand expression before and after feedings.  She has a manual pump and is getting a DEBP.  I explained that she may have to work to get baby to latch since he has been bottle feeding for multiple feeds now.  I explained that he has not been learning at the breast and the bottle is much easier.  However, if she will be diligent about feeding and pumping he should learn to latch and successfully breastfeed.  Reminded parents of our OP services and to call insurance company to see if these visits will be covered if she would like to return for OP with our Advertising copywriterlactation consultant.    Mother still seems unsure but she did say that she wants to give some breast milk but "probably in a bottle."  I am not certain how dedicated she will be.  Mother's breasts are soft and non tender.  She has no further questions/concerns at this time.      Maternal Data Formula Feeding for Exclusion: Yes Reason for exclusion: Mother's choice to  formula feed on admision Has patient been taught Hand Expression?: Yes  Feeding Feeding Type: Formula  LATCH Score                   Interventions    Lactation Tools Discussed/Used WIC Program: No   Consult Status Consult Status: Complete Date: 06/30/18 Follow-up type: Call as needed    Tiffany Sanchez 06/30/2018, 12:17 PM

## 2018-07-04 ENCOUNTER — Other Ambulatory Visit: Payer: Self-pay

## 2018-07-04 ENCOUNTER — Encounter: Payer: Self-pay | Admitting: Emergency Medicine

## 2018-07-04 ENCOUNTER — Emergency Department
Admission: EM | Admit: 2018-07-04 | Discharge: 2018-07-04 | Disposition: A | Discharge status: Home / Self Care | Payer: Medicaid Other | Attending: Emergency Medicine | Admitting: Emergency Medicine

## 2018-07-04 DIAGNOSIS — Y999 Unspecified external cause status: Secondary | ICD-10-CM | POA: Insufficient documentation

## 2018-07-04 DIAGNOSIS — J45909 Unspecified asthma, uncomplicated: Secondary | ICD-10-CM | POA: Diagnosis not present

## 2018-07-04 DIAGNOSIS — Y939 Activity, unspecified: Secondary | ICD-10-CM | POA: Insufficient documentation

## 2018-07-04 DIAGNOSIS — Y92009 Unspecified place in unspecified non-institutional (private) residence as the place of occurrence of the external cause: Secondary | ICD-10-CM | POA: Diagnosis not present

## 2018-07-04 DIAGNOSIS — S91209A Unspecified open wound of unspecified toe(s) with damage to nail, initial encounter: Secondary | ICD-10-CM

## 2018-07-04 DIAGNOSIS — S91201A Unspecified open wound of right great toe with damage to nail, initial encounter: Secondary | ICD-10-CM | POA: Insufficient documentation

## 2018-07-04 DIAGNOSIS — Z79899 Other long term (current) drug therapy: Secondary | ICD-10-CM | POA: Insufficient documentation

## 2018-07-04 DIAGNOSIS — I10 Essential (primary) hypertension: Secondary | ICD-10-CM | POA: Diagnosis not present

## 2018-07-04 DIAGNOSIS — W228XXA Striking against or struck by other objects, initial encounter: Secondary | ICD-10-CM | POA: Diagnosis not present

## 2018-07-04 DIAGNOSIS — S99921A Unspecified injury of right foot, initial encounter: Secondary | ICD-10-CM | POA: Diagnosis present

## 2018-07-04 MED ORDER — LIDOCAINE HCL (PF) 1 % IJ SOLN
5.0000 mL | Freq: Once | INTRAMUSCULAR | Status: AC
  Administered 2018-07-04: 5 mL via INTRADERMAL
  Filled 2018-07-04: qty 5

## 2018-07-04 MED ORDER — BUPIVACAINE HCL (PF) 0.5 % IJ SOLN
30.0000 mL | Freq: Once | INTRAMUSCULAR | Status: AC
  Administered 2018-07-04: 30 mL
  Filled 2018-07-04: qty 30

## 2018-07-04 NOTE — ED Provider Notes (Signed)
[facility] Emergency Department Provider Note  ____________________________________________  Time seen: Approximately 7:36 AM  I have reviewed the triage vital signs and the nursing notes.   HISTORY  Chief Complaint Toe Pain   HPI Tiffany RamusBianca I Finger is a 22 y.o. female who presents to the emergency department for treatment and evaluation of  right great toe pain after she hit on the wooden leg underneath her couch. Big toenail is raised off the nailbed.  Bleeding is well controlled.  No alleviating measures attempted prior to arrival.  Past Medical History:  Diagnosis Date  . Anemia   . Asthma    no hospitalizations, no recent attacks (since childhood)  . Complication of anesthesia    one sided epidural  . History of gestational hypertension   . Hypertension   . Ovarian cyst   . Vaginal Pap smear, abnormal     Patient Active Problem List   Diagnosis Date Noted  . Status post cesarean section 06/28/2018  . Abdominal pain 06/16/2018  . LGA (large for gestational age) fetus affecting management of mother 06/16/2018  . BMI 50.0-59.9, adult (HCC) 06/03/2018  . History of herpes genitalis 06/03/2018  . Supervision of high risk pregnancy, antepartum, third trimester 05/30/2018  . Obesity in pregnancy 05/30/2018  . Labor and delivery affected by shoulder dystocia 05/30/2018  . Chronic hypertension during pregnancy, antepartum 12/13/2017    Past Surgical History:  Procedure Laterality Date  . CESAREAN SECTION N/A 06/28/2018   Procedure: CESAREAN SECTION;  Surgeon: Levie HeritageStinson, Jacob J, DO;  [facility];  Service: Obstetrics;  Laterality: N/A;  . THERAPEUTIC ABORTION      Prior to Admission medications   Medication Sig Start Date End Date Taking? Authorizing Provider  ferrous sulfate 325 (65 FE) MG tablet Take 325 mg by mouth 2 (two) times daily.  03/03/18 03/03/19  [provider]  ibuprofen (ADVIL,MOTRIN) 600 MG tablet Take 1 tablet  (600 mg total) by mouth every 6 (six) hours. 06/30/18   Tilda BurrowFerguson, John V, MD  oxyCODONE-acetaminophen (PERCOCET/ROXICET) 5-325 MG tablet Take 1 tablet by mouth every 4 (four) hours as needed (pain scale 4-7). 06/30/18   Tilda BurrowFerguson, John V, MD  Prenatal Vit-Fe Fumarate-FA (PRENATAL MULTIVITAMIN) TABS tablet Take 1 tablet by mouth daily.     [provider]  valACYclovir (VALTREX) 500 MG tablet Take 1 tablet (500 mg total) by mouth 2 (two) times daily. Patient taking differently: Take 500 mg by mouth daily.  06/03/18   Espino BingPickens, Charlie, MD    Allergies Penicillins  Family History  Problem Relation Age of Onset  . Hypertension Mother   . Hypertension Father   . Diabetes Paternal Grandfather     Social History Social History   Tobacco Use  . Smoking status: Never Smoker  . Smokeless tobacco: Never Used  Substance Use Topics  . Alcohol use: No  . Drug use: No    Review of Systems  Constitutional: Negative for fever.  6 weeks postpartum and post C-section without complaints Respiratory: Negative for cough or shortness of breath.  Musculoskeletal: Negative for myalgias Skin: Right great toenail lifted from base Neurological: Negative for numbness or paresthesias. ____________________________________________   PHYSICAL EXAM:  VITAL SIGNS: ED Triage Vitals  Enc Vitals Group     BP 07/04/18 0651 139/80     Pulse Rate 07/04/18 0651 93     Resp 07/04/18 0651 18     Temp 07/04/18 0651 99 F (37.2 C)     Temp  Source 07/04/18 0651 Oral     SpO2 07/04/18 0651 100 %     Weight 07/04/18 0649 (!) 323 lb (146.5 kg)     Height 07/04/18 0649 5\' 7"  (1.702 m)     Head Circumference --      Peak Flow --      Pain Score 07/04/18 0648 6     Pain Loc --      Pain Edu? --      Excl. in GC? --      Constitutional: Well appearing. Eyes: Conjunctivae are clear without discharge or drainage. Nose: No rhinorrhea noted. Mouth/Throat: Airway is patent.  Neck: No stridor. Unrestricted  range of motion observed. Cardiovascular: Capillary refill is <3 seconds.  Respiratory: Respirations are even and unlabored.. Musculoskeletal: Unrestricted range of motion observed. Neurologic: Awake, alert, and oriented x 4.  Skin: Right great toenail is lifted from the nail bed approximately 1/2 inch down from the proximal end.  ____________________________________________   LABS (all labs ordered are listed, but only abnormal results are displayed)  Labs Reviewed - No data to display ____________________________________________  EKG  Not indicated. ____________________________________________  RADIOLOGY  Not indicated ____________________________________________   PROCEDURES  Procedures   Right great toe was anesthetized with digital block.  1% lidocaine without epinephrine, 4 mL's.  Verbal consent was obtained.  Toe was irrigated with normal saline and Betadine.  Toenail was trimmed so that when flipped back down it would be just below the edge of the end of the toe. Dermabond was applied to the nailbed and the nail was put back in place. ____________________________________________   INITIAL IMPRESSION / ASSESSMENT AND PLAN / ED COURSE  Tiffany Sanchez is a 22 y.o. female who presents to the emergency department for treatment and evaluation after stubbing her toe on the couch. Nailbed was without laceration and cleansed well prior to nail being flipped back down.  Signs and symptoms of infection were reviewed with the patient.  She is to return to the emergency department for symptoms of concern if she is unable to schedule an appointment with her primary care provider.  Patient has Percocet and ibuprofen at home as she is 6 weeks postpartum and post C-section.  She will take that as prescribed if needed for pain.   Medications  lidocaine (PF) (XYLOCAINE) 1 % injection 5 mL (5 mLs Intradermal Given 07/04/18 0735)  bupivacaine (MARCAINE) 0.5 % injection 30 mL (30 mLs  Infiltration Given 07/04/18 0735)     Pertinent labs & imaging results that were available during my care of the patient were reviewed by me and considered in my medical decision making (see chart for details).  ____________________________________________   FINAL CLINICAL IMPRESSION(S) / ED DIAGNOSES  Final diagnoses:  Traumatic avulsion of nail plate of toe, initial encounter    ED Discharge Orders    None       Note:  This document was prepared using Dragon voice recognition software and may include unintentional dictation errors.    Chinita Pesterriplett, Micaela Stith B, FNP 07/04/18 1458    Dionne BucySiadecki, Sebastian, MD 07/04/18 1523

## 2018-07-04 NOTE — ED Notes (Signed)
See triage note   States she stubbed her right great toe on sofa this   Bent bac toenail  dsried blood noted around nailbed

## 2018-07-04 NOTE — ED Triage Notes (Signed)
Pt presents to ED with c/o right great toe pain after she hit her toe on edge of the couch and pulled the toenail back. Bleeding slightly.

## 2018-07-11 ENCOUNTER — Ambulatory Visit: Payer: Medicaid Other

## 2018-07-14 ENCOUNTER — Ambulatory Visit: Payer: Medicaid Other

## 2018-07-15 ENCOUNTER — Other Ambulatory Visit: Payer: Self-pay

## 2018-07-15 ENCOUNTER — Emergency Department
Admission: EM | Admit: 2018-07-15 | Discharge: 2018-07-16 | Disposition: A | Discharge status: Home / Self Care | Payer: Medicaid Other | Attending: Emergency Medicine | Admitting: Emergency Medicine

## 2018-07-15 ENCOUNTER — Encounter: Payer: Self-pay | Admitting: *Deleted

## 2018-07-15 DIAGNOSIS — T8130XA Disruption of wound, unspecified, initial encounter: Secondary | ICD-10-CM

## 2018-07-15 DIAGNOSIS — O900 Disruption of cesarean delivery wound: Secondary | ICD-10-CM | POA: Diagnosis present

## 2018-07-15 DIAGNOSIS — L089 Local infection of the skin and subcutaneous tissue, unspecified: Secondary | ICD-10-CM

## 2018-07-15 DIAGNOSIS — O8600 Infection of obstetric surgical wound, unspecified: Secondary | ICD-10-CM | POA: Diagnosis not present

## 2018-07-15 DIAGNOSIS — B9789 Other viral agents as the cause of diseases classified elsewhere: Secondary | ICD-10-CM | POA: Diagnosis not present

## 2018-07-15 DIAGNOSIS — T148XXA Other injury of unspecified body region, initial encounter: Secondary | ICD-10-CM

## 2018-07-15 DIAGNOSIS — I10 Essential (primary) hypertension: Secondary | ICD-10-CM | POA: Diagnosis not present

## 2018-07-15 DIAGNOSIS — J45909 Unspecified asthma, uncomplicated: Secondary | ICD-10-CM | POA: Diagnosis not present

## 2018-07-15 NOTE — ED Triage Notes (Signed)
[facility]  Pt now has drainage and odor from incision.  Pt reports left lower abd pain.  Pt alert.

## 2018-07-16 ENCOUNTER — Emergency Department: Payer: Medicaid Other

## 2018-07-16 LAB — CBC
HCT: 33.1 % — ABNORMAL LOW (ref 35.0–47.0)
Hemoglobin: 10.7 g/dL — ABNORMAL LOW (ref 12.0–16.0)
MCH: 24.7 pg — ABNORMAL LOW (ref 26.0–34.0)
MCHC: 32.2 g/dL (ref 32.0–36.0)
MCV: 76.8 fL — ABNORMAL LOW (ref 80.0–100.0)
Platelets: 345 10*3/uL (ref 150–440)
RBC: 4.31 MIL/uL (ref 3.80–5.20)
RDW: 15.8 % — ABNORMAL HIGH (ref 11.5–14.5)
WBC: 9.2 10*3/uL (ref 3.6–11.0)

## 2018-07-16 LAB — BASIC METABOLIC PANEL
Anion gap: 6 (ref 5–15)
BUN: 16 mg/dL (ref 6–20)
CO2: 23 mmol/L (ref 22–32)
Calcium: 9 mg/dL (ref 8.9–10.3)
Chloride: 111 mmol/L (ref 98–111)
Creatinine, Ser: 0.85 mg/dL (ref 0.44–1.00)
GFR calc Af Amer: >60 mL/min (ref >60)
GFR calc non Af Amer: >60 mL/min (ref >60)
Glucose, Bld: 99 mg/dL (ref 70–99)
Potassium: 3.4 mmol/L — ABNORMAL LOW (ref 3.5–5.1)
Sodium: 140 mmol/L (ref 135–145)

## 2018-07-16 LAB — LACTIC ACID, PLASMA: Lactic Acid, Venous: 0.8 mmol/L (ref 0.5–1.9)

## 2018-07-16 MED ORDER — SULFAMETHOXAZOLE-TRIMETHOPRIM 800-160 MG PO TABS: 1.0000 | ORAL_TABLET | Freq: Two times a day (BID) | ORAL | 0 refills | Status: DC

## 2018-07-16 MED ORDER — IOPAMIDOL (ISOVUE-300) INJECTION 61%
125.0000 mL | Freq: Once | INTRAVENOUS | Status: AC | PRN
  Administered 2018-07-16: 125 mL via INTRAVENOUS

## 2018-07-16 MED ORDER — SULFAMETHOXAZOLE-TRIMETHOPRIM 800-160 MG PO TABS: 1.0000 | ORAL_TABLET | Freq: Two times a day (BID) | ORAL | 0 refills | Status: AC

## 2018-07-16 MED ORDER — CLINDAMYCIN PHOSPHATE 600 MG/50ML IV SOLN
600.0000 mg | Freq: Once | INTRAVENOUS | Status: AC
  Administered 2018-07-16: 600 mg via INTRAVENOUS
  Filled 2018-07-16 (×2): qty 50

## 2018-07-16 NOTE — ED Notes (Signed)
IV team is in the Pt's room to start a line.

## 2018-07-16 NOTE — ED Provider Notes (Signed)
[facility] Emergency Department Provider Note   ____________________________________________   First MD Initiated Contact with Patient 07/16/18 0001     (approximate)  I have reviewed the triage vital signs and the nursing notes.   HISTORY  Chief Complaint Post-op Problem    HPI Tiffany Sanchez is a 22 y.o. female who comes into the hospital today because she is concerned about her C-section incision.  She thinks it may be open and infected.  She noticed some excessive drainage and pus coming from it.  She states that she did have it checked yesterday at the OB/GYN's office and they told her that it looked good.  It stings on the left side of the incision.  She states that her C-section was done at [facility] and she does not remember her OB/GYN's name.  She did not call anyone there to let them know what was going on.  She has not had any fever and rates her pain a 7 out of 10 in intensity currently.  The patient C-section was August 10.  She is here today for evaluation of her symptoms.   Past Medical History:  Diagnosis Date  . Anemia   . Asthma    no hospitalizations, no recent attacks (since childhood)  . Complication of anesthesia    one sided epidural  . History of gestational hypertension   . Hypertension   . Ovarian cyst   . Vaginal Pap smear, abnormal     Patient Active Problem List   Diagnosis Date Noted  . Status post cesarean section 06/28/2018  . Abdominal pain 06/16/2018  . LGA (large for gestational age) fetus affecting management of mother 06/16/2018  . BMI 50.0-59.9, adult (HCC) 06/03/2018  . History of herpes genitalis 06/03/2018  . Supervision of high risk pregnancy, antepartum, third trimester 05/30/2018  . Obesity in pregnancy 05/30/2018  . Labor and delivery affected by shoulder dystocia 05/30/2018  . Chronic hypertension during pregnancy, antepartum 12/13/2017    Past Surgical History:  Procedure Laterality Date    . CESAREAN SECTION N/A 06/28/2018   Procedure: CESAREAN SECTION;  Surgeon: Levie Heritage, DO;  Location: [facility] BIRTHING SUIT[facility];  Service: Obstetrics;  Laterality: N/A;  . THERAPEUTIC ABORTION      Prior to Admission medications   Medication Sig Start Date End Date Taking? Authorizing Provider  ibuprofen (ADVIL,MOTRIN) 600 MG tablet Take 1 tablet (600 mg total) by mouth every 6 (six) hours. Patient not taking: Reported on 07/16/2018 06/30/18   Tilda Burrow, MD  oxyCODONE-acetaminophen (PERCOCET/ROXICET) 5-325 MG tablet Take 1 tablet by mouth every 4 (four) hours as needed (pain scale 4-7). Patient not taking: Reported on 07/16/2018 06/30/18   Tilda Burrow, MD  sulfamethoxazole-trimethoprim (BACTRIM DS) 800-160 MG tablet Take 1 tablet by mouth 2 (two) times daily for 10 days. 07/16/18 07/26/18  Rebecka Apley, MD  valACYclovir (VALTREX) 500 MG tablet Take 1 tablet (500 mg total) by mouth 2 (two) times daily. Patient not taking: Reported on 07/16/2018 06/03/18   Doniphan Bing, MD    Allergies Penicillins  Family History  Problem Relation Age of Onset  . Hypertension Mother   . Hypertension Father   . Diabetes Paternal Grandfather     Social History Social History   Tobacco Use  . Smoking status: Never Smoker  . Smokeless tobacco: Never Used  Substance Use Topics  . Alcohol use: No  . Drug use: No    Review of Systems  Constitutional: No  fever/chills Eyes: No visual changes. ENT: No sore throat. Cardiovascular: Denies chest pain. Respiratory: Denies shortness of breath. Gastrointestinal: No abdominal pain.  No nausea, no vomiting.  No diarrhea.  No constipation. Genitourinary: Negative for dysuria. Musculoskeletal: Negative for back pain. Skin: pain and drainage from c-section wound Neurological: Negative for headaches, focal weakness or numbness.   ____________________________________________   PHYSICAL EXAM:  VITAL SIGNS: ED Triage Vitals  Enc Vitals  Group     BP 07/15/18 2353 (!) 150/81     Pulse Rate 07/15/18 2349 71     Resp 07/15/18 2349 18     Temp 07/15/18 2349 99.6 F (37.6 C)     Temp Source 07/15/18 2349 Oral     SpO2 07/15/18 2349 100 %     Weight 07/15/18 2350 (!) 307 lb (139.3 kg)     Height 07/15/18 2350 5\' 7"  (1.702 m)     Head Circumference --      Peak Flow --      Pain Score 07/15/18 2350 6     Pain Loc --      Pain Edu? --      Excl. in GC? --     Constitutional: Alert and oriented. Well appearing and in moderate distress. Eyes: Conjunctivae are normal. PERRL. EOMI. Head: Atraumatic. Nose: No congestion/rhinnorhea. Mouth/Throat: Mucous membranes are moist.  Oropharynx non-erythematous. Cardiovascular: Normal rate, regular rhythm. Grossly normal heart sounds.  Good peripheral circulation. Respiratory: Normal respiratory effort.  No retractions. Lungs CTAB. Gastrointestinal: Soft and nontender. No distention. Positive bowel sounds Musculoskeletal: No lower extremity tenderness nor edema.  Neurologic:  Normal speech and language.  Skin:  Skin is warm, dry brownish appearing discharge from left side of C-section incision with some mild dehiscence of the wound Psychiatric: Mood and affect are normal.   ____________________________________________   LABS (all labs ordered are listed, but only abnormal results are displayed)  Labs Reviewed  BASIC METABOLIC PANEL - Abnormal; Notable for the following components:      Result Value   Potassium 3.4 (*)    All other components within normal limits  CBC - Abnormal; Notable for the following components:   Hemoglobin 10.7 (*)    HCT 33.1 (*)    MCV 76.8 (*)    MCH 24.7 (*)    RDW 15.8 (*)    All other components within normal limits  CULTURE, BLOOD (ROUTINE X 2)  CULTURE, BLOOD (ROUTINE X 2)  LACTIC ACID, PLASMA   ____________________________________________  EKG  none ____________________________________________  RADIOLOGY  ED MD interpretation: CT  abdomen and pelvis: C-section scar within the lower anterior wall. There is mild associated superficial fat stranding and derma thickening which may represent infection but no discrete fluid collection is identified. Otherwise unremarkable CT of the abdomen and pelvis.   Official radiology report(s): Ct Abdomen Pelvis W Contrast  Result Date: 07/16/2018 CLINICAL DATA:  22 y/o F; 06/28/2018 C-section. Drainage in odor from the incision. EXAM: CT ABDOMEN AND PELVIS WITH CONTRAST TECHNIQUE: Multidetector CT imaging of the abdomen and pelvis was performed using the standard protocol following bolus administration of intravenous contrast. CONTRAST:  ISOVUE-300 IOPAMIDOL (ISOVUE-300) INJECTION 61% COMPARISON:  None. FINDINGS: Lower chest: No acute abnormality. Hepatobiliary: No focal liver abnormality is seen. No gallstones, gallbladder wall thickening, or biliary dilatation. Pancreas: Unremarkable. No pancreatic ductal dilatation or surrounding inflammatory changes. Spleen: Normal in size without focal abnormality. Adrenals/Urinary Tract: Adrenal glands are unremarkable. Kidneys are normal, without renal calculi, focal lesion, or hydronephrosis.  Bladder is unremarkable. Stomach/Bowel: Stomach is within normal limits. Appendix appears normal. No evidence of bowel wall thickening, distention, or inflammatory changes. Vascular/Lymphatic: No significant vascular findings are present. No enlarged abdominal or pelvic lymph nodes. Reproductive: Postsurgical changes related to recent C-section. There is scarring within the lower anterior abdominal wall, but no discrete fluid collection. Near the skin surface there is fat stranding and dermal thickening. Homogeneous subendometrial enhancement, likely physiologic. No pelvic edema or fluid collection. No adnexal lesion. Other: No abdominal wall hernia or abnormality. No abdominopelvic ascites. Musculoskeletal: No acute or significant osseous findings. IMPRESSION:  C-section scar within the lower anterior abdominal wall. There is mild associated superficial fat stranding and dermal thickening which may represent infection, but no discrete fluid collection is identified. Otherwise unremarkable CT of abdomen and pelvis. Electronically Signed   By: Mitzi HansenLance  Furusawa-Stratton M.D.   On: 07/16/2018 03:35    ____________________________________________   PROCEDURES  Procedure(s) performed: None  Procedures  Critical Care performed: No  ____________________________________________   INITIAL IMPRESSION / ASSESSMENT AND PLAN / ED COURSE  As part of my medical decision making, I reviewed the following data within the electronic MEDICAL RECORD NUMBER Notes from prior ED visits and [place] Controlled Substance Database   This is a 22 year old female who comes into the hospital today with a concern about an infection to her C-section wound.  She states that she is having some drainage and some pain.  The patient does have some drainage.  I check some blood work to include a BMP and a CBC.  The patient's blood work is unremarkable.  I also check a lactic acid which was negative.  The patient did receive a CT scan to see the extent of this infection.  I will give her a dose of clindamycin and she will be reassessed.  The patient CT scan does not show any distinct fluid collection but does show some fat stranding with a concern for infection.  I did give the patient a dose of clindamycin.  [facility] but they were unavailable.  I contacted Dr. Valentino Saxonherry who recommended starting the patient either on Keflex or Bactrim.  The patient will be discharged to follow-up with her primary care physician.      ____________________________________________   FINAL CLINICAL IMPRESSION(S) / ED DIAGNOSES  Final diagnoses:  Wound infection  Wound dehiscence     ED Discharge Orders         Ordered    sulfamethoxazole-trimethoprim  (BACTRIM DS) 800-160 MG tablet  2 times daily,   Status:  Discontinued     07/16/18 0513    sulfamethoxazole-trimethoprim (BACTRIM DS) 800-160 MG tablet  2 times daily     07/16/18 0517           Note:  This document was prepared using Dragon voice recognition software and may include unintentional dictation errors.    Rebecka ApleyWebster, Jamani Bearce P, MD 07/16/18 [phone]

## 2018-07-16 NOTE — Discharge Instructions (Addendum)
Please follow up with your OB/GYN for further evaluation of your symptoms.  °

## 2018-07-21 LAB — CULTURE, BLOOD (ROUTINE X 2)
Culture: NO GROWTH
Culture: NO GROWTH

## 2018-07-22 ENCOUNTER — Ambulatory Visit: Payer: Medicaid Other

## 2018-07-31 NOTE — Progress Notes (Signed)
Obstetrics and Gynecology Postpartum Visit  Appointment Date: 08/01/2018  OB[facility]N Clinic[facility]  Primary Care Provider: Karie FetchAycock, Ngwe A  Chief Complaint:  Chief Complaint  Patient presents with  . Postpartum Care    History of Present Illness: Renee RamusBianca I Holroyd is a 22 y.o. African-American G3P1011 (Patient's last menstrual period was 08/01/2018 (approximate).), seen for the above chief complaint.  She is s/p pLTCS for h/o shoulder dystocia on 8/10; she was discharged to home on POD#2  Vaginal bleeding or discharge: No  Intercourse: No  PP depression s/s: No  Any bowel or bladder issues: No  Pap smear: LSIL 05/2017   Options d/w pt and she'd like the LNG IUD  Review of Systems: s noted in the History of Present Illness.  Patient Active Problem List   Diagnosis Date Noted  . Status post cesarean section 06/28/2018  . Abdominal pain 06/16/2018  . LGA (large for gestational age) fetus affecting management of mother 06/16/2018  . BMI 50.0-59.9, adult (HCC) 06/03/2018  . History of herpes genitalis 06/03/2018  . Supervision of high risk pregnancy, antepartum, third trimester 05/30/2018  . Obesity in pregnancy 05/30/2018  . Labor and delivery affected by shoulder dystocia 05/30/2018  . Chronic hypertension during pregnancy, antepartum 12/13/2017    Medications Shirlyn I. Fouche had no medications administered during this visit. Current Outpatient Medications  Medication Sig Dispense Refill  . ibuprofen (ADVIL,MOTRIN) 600 MG tablet Take 1 tablet (600 mg total) by mouth every 6 (six) hours. 30 tablet 0   No current facility-administered medications for this visit.     Allergies Penicillins  Physical Exam:  BP 128/82 (BP Location: Left Arm, Patient Position: Sitting, Cuff Size: Large)   Pulse 73   Resp 16   Ht 5\' 7"  (1.702 m)   Wt (!) 303 lb 3.2 oz (137.5 kg)   LMP 08/01/2018 (Approximate)   Breastfeeding? No Comment: Bottle  BMI 47.49 kg/m  Body  mass index is 47.49 kg/m. General appearance: Well nourished, well developed female in no acute distress.  Cardiovascular: normal s1 and s2.  No murmurs, rubs or gallops. Respiratory:  Clear to auscultation bilateral. Normal respiratory effort Abdomen: positive bowel sounds and no masses, hernias; diffusely non tender to palpation, non distended. 1cm spot on the mid part of the incision has granulation tissue where the superior edge overlaps the inferior part--silver nitrate applied, no e/o infection. Rest of incision is c/d/i Neuro/Psych:  Normal mood and affect.  Skin:  Warm and dry.  Lymphatic:  No inguinal lymphadenopathy.   Pelvic exam: is limited by body habitus EGBUS: within normal limits Vagina: within normal limits and with no blood in the vault, Cervix:  no lesions or cervical motion tenderness Uterus:  nonenlarged and approximately 8 week sized Adnexa:  normal adnexa and no mass, fullness, tenderness Rectovaginal: deferred  See procedure note for uncomplicated Liletta IUD insertion  Laboratory: UPT negative  PP Depression Screening:  EPDS score 4  Assessment: pt doing well  Plan:  Routine care. F/u incision and string check one month. Pt told to consider effective in one week  Follow up pap smear  Cornelia Copaharlie Jefferey Lippmann, Jr MD Attending [facility](Faculty Practice)

## 2018-08-01 ENCOUNTER — Encounter: Payer: Self-pay | Admitting: Obstetrics and Gynecology

## 2018-08-01 ENCOUNTER — Other Ambulatory Visit (HOSPITAL_COMMUNITY)
Admission: RE | Admit: 2018-08-01 | Discharge: 2018-08-01 | Disposition: A | Discharge status: Home / Self Care | Payer: Medicaid Other | Source: Ambulatory Visit | Attending: Obstetrics and Gynecology | Admitting: Obstetrics and Gynecology

## 2018-08-01 ENCOUNTER — Ambulatory Visit (INDEPENDENT_AMBULATORY_CARE_PROVIDER_SITE_OTHER): Payer: Medicaid Other | Admitting: Obstetrics and Gynecology

## 2018-08-01 VITALS — BP 128/82 | HR 73 | Resp 16 | Ht 67.0 in | Wt 303.2 lb

## 2018-08-01 DIAGNOSIS — Z23 Encounter for immunization: Secondary | ICD-10-CM

## 2018-08-01 DIAGNOSIS — Z87898 Personal history of other specified conditions: Secondary | ICD-10-CM | POA: Diagnosis not present

## 2018-08-01 DIAGNOSIS — R87612 Low grade squamous intraepithelial lesion on cytologic smear of cervix (LGSIL): Secondary | ICD-10-CM | POA: Diagnosis not present

## 2018-08-01 DIAGNOSIS — Z30430 Encounter for insertion of intrauterine contraceptive device: Secondary | ICD-10-CM | POA: Diagnosis not present

## 2018-08-01 DIAGNOSIS — Z01818 Encounter for other preprocedural examination: Secondary | ICD-10-CM

## 2018-08-01 DIAGNOSIS — Z3202 Encounter for pregnancy test, result negative: Secondary | ICD-10-CM | POA: Diagnosis not present

## 2018-08-01 DIAGNOSIS — Z8742 Personal history of other diseases of the female genital tract: Secondary | ICD-10-CM

## 2018-08-01 DIAGNOSIS — Z1389 Encounter for screening for other disorder: Secondary | ICD-10-CM | POA: Diagnosis not present

## 2018-08-01 LAB — POCT URINE PREGNANCY: Preg Test, Ur: NEGATIVE

## 2018-08-01 MED ORDER — LEVONORGESTREL 19.5 MCG/DAY IU IUD
INTRAUTERINE_SYSTEM | Freq: Once | INTRAUTERINE | Status: AC
  Administered 2018-08-01: 11:00:00 via INTRAUTERINE

## 2018-08-01 NOTE — Procedures (Signed)
Intrauterine Device (IUD) Insertion Procedure Note  Written consent was obtained; her urine pregnancy test was: negative. Patient has been abstinent since pregnancy. The patient understands the risks of IUD placement, which include but are not limited to: bleeding, infection, uterine perforation, risk of expulsion, risk of failure < 1%, increased risk of ectopic pregnancy in the event of failure.   Prior to the procedure being performed, the patient (or guardian) was asked to state their full name, date of birth, and the type of procedure being performed. A bimanual exam showed the uterus to be retroverted.  Next, the cervix was viewed and a pap smear done. The cervix and vagina were then cleaned with an antiseptic solution, and the cervix was grasped with a tenaculum.  The uterus was sounded to 9.5 cm.  The Liletta was placed without difficulty in the usual fashion.  The strings were cut to 3-4 cm.  The tenaculum was removed and cervix was found to be hemostatic.    No complications, patient tolerated the procedure well.  Tiffany Sanchez, Jr MD Attending [facility](Faculty Practice)

## 2018-08-05 LAB — CYTOLOGY - PAP
Chlamydia: NEGATIVE
Diagnosis: NEGATIVE
Neisseria Gonorrhea: NEGATIVE
Trichomonas: NEGATIVE

## 2018-08-28 ENCOUNTER — Ambulatory Visit: Payer: Medicaid Other | Admitting: Obstetrics and Gynecology

## 2018-09-23 ENCOUNTER — Ambulatory Visit: Payer: Medicaid Other | Admitting: Obstetrics & Gynecology

## 2018-09-23 VITALS — BP 142/83 | HR 84 | Wt 300.0 lb

## 2018-09-23 DIAGNOSIS — Z30431 Encounter for routine checking of intrauterine contraceptive device: Secondary | ICD-10-CM

## 2018-09-23 DIAGNOSIS — T8384XA Pain from genitourinary prosthetic devices, implants and grafts, initial encounter: Secondary | ICD-10-CM

## 2018-09-23 DIAGNOSIS — Z975 Presence of (intrauterine) contraceptive device: Secondary | ICD-10-CM

## 2018-09-23 DIAGNOSIS — N921 Excessive and frequent menstruation with irregular cycle: Secondary | ICD-10-CM

## 2018-09-23 MED ORDER — NAPROXEN 500 MG PO TABS: 500.0000 mg | ORAL_TABLET | Freq: Two times a day (BID) | ORAL | 2 refills | Status: DC

## 2018-09-23 NOTE — Progress Notes (Signed)
   GYNECOLOGY OFFICE PROGRESS NOTE  History:  22 y.o. G3P1011 here today for today for IUD check up; Liletta IUD was placed 08/01/2018.  She demands removal due to occasional sharp pain, cramping and irregular spotting. No plan about what other birth control she wants to use, but maintains she was not informed of these side effects persisting this long; "If I had known, I would not have gotten it". She reports that she read it causes UTIs, and she wants it out as she is prone to UTIs.  "This is my body, I want this out".  The following portions of the patient's history were reviewed and updated as appropriate: allergies, current medications, past family history, past medical history, past social history, past surgical history and problem list. Last pap smear on 08/01/2018 was normal.  Review of Systems:  Pertinent items are noted in HPI.   Objective:  Physical Exam Blood pressure (!) 142/83, pulse 84, weight 300 lb (136.1 kg), not currently breastfeeding. CONSTITUTIONAL: Well-developed, well-nourished female in no acute distress.  HENT:  Normocephalic, atraumatic. External right and left ear normal. Oropharynx is clear and moist EYES: Conjunctivae and EOM are normal. Pupils are equal, round, and reactive to light. No scleral icterus.  NECK: Normal range of motion, supple, no masses CARDIOVASCULAR: Normal heart rate noted RESPIRATORY: Effort and breath sounds normal, no problems with respiration noted ABDOMEN: Soft, no distention noted.   PELVIC: Normal appearing external genitalia; normal appearing vaginal mucosa and cervix. Scant red-brown discharge seen in vagina.  IUD strings visualized, about 3 cm in length outside cervix.   Assessment & Plan:  1. IUD check up 2. Breakthrough bleeding associated with intrauterine device (IUD) 3. Pain due to intrauterine contraceptive device (IUD), initial encounter [facility]) Patient was counseled about expected side effects after IUD insertion; assured that  cramping/pain and bleeding can occur for up to 3-6 months after placement.  Recommended trial of Naproxen to see if this helps.  If it does not, may try low estrogen OCP (has HTN) or Norethindrone regimen. Patient reports she will try it but will likely go to PCP for removal since I did not do it today. She was cautioned about her history; she is morbidly obese with HTN, and not a good estrogen candidate.  IUD can also help in protecting her uterus from endometrial cancer.  Not a good candidate for Nexplanon given habitus.  Condoms recommended for STI prevention and for contraception if she does remove this IUD.  - naproxen (NAPROSYN) 500 MG tablet; Take 1 tablet (500 mg total) by mouth 2 (two) times daily with a meal. Take for bleeding and pain  Dispense: 20 tablet; Refill: 2 She was told to call/come in for any other concerns but was advised to try to see if the intervention could help her symptoms as she has the best contraceptive modality in place.  She had complained to RN today that she did not like that she conceived back-to-back; and she was told this can help to prevent that. Patient stormed out of the office after her encounter, clearly unhappy that the IUD was not removed during this encounter.     Jaynie Collins, MD, FACOG Obstetrician & Gynecologist, [facility] for [employer], [facility]

## 2019-02-01 ENCOUNTER — Encounter (HOSPITAL_COMMUNITY): Payer: Self-pay | Admitting: Emergency Medicine

## 2019-02-01 ENCOUNTER — Other Ambulatory Visit: Payer: Self-pay

## 2019-02-01 ENCOUNTER — Emergency Department (HOSPITAL_COMMUNITY)
Admission: EM | Admit: 2019-02-01 | Discharge: 2019-02-01 | Disposition: A | Discharge status: Home / Self Care | Payer: Medicaid Other | Attending: Emergency Medicine | Admitting: Emergency Medicine

## 2019-02-01 DIAGNOSIS — J029 Acute pharyngitis, unspecified: Secondary | ICD-10-CM

## 2019-02-01 DIAGNOSIS — Z79899 Other long term (current) drug therapy: Secondary | ICD-10-CM | POA: Insufficient documentation

## 2019-02-01 DIAGNOSIS — J45909 Unspecified asthma, uncomplicated: Secondary | ICD-10-CM | POA: Diagnosis not present

## 2019-02-01 DIAGNOSIS — I10 Essential (primary) hypertension: Secondary | ICD-10-CM | POA: Insufficient documentation

## 2019-02-01 LAB — INFLUENZA PANEL BY PCR (TYPE A & B)
Influenza A By PCR: NEGATIVE
Influenza B By PCR: NEGATIVE

## 2019-02-01 LAB — GROUP A STREP BY PCR: Group A Strep by PCR: NOT DETECTED

## 2019-02-01 NOTE — ED Triage Notes (Signed)
Pt reports sore throat and body aches for 3 days. Pt also reports swollen lymph nodes in neck.

## 2019-02-01 NOTE — ED Notes (Signed)
Discharge instructions discussed with pt. And family. Teach back method used. Pt. Has no questions.

## 2019-02-01 NOTE — ED Provider Notes (Signed)
[facility] EMERGENCY DEPARTMENT Provider Note   CSN: [id number] Arrival date & time: 02/01/19  0059    History   Chief Complaint Chief Complaint  Patient presents with  . Sore Throat    HPI Tiffany Sanchez is a 23 y.o. female.     Patient presents to the emergency department with a chief complaint of sore throat.  She reports having sore throat and body aches x3 days.  States that her kids been sick with the same.  Denies any fever, chills, productive cough.  Denies any treatments prior to arrival.  Denies any recent travel.  Denies any other associated symptoms.  The history is provided by the patient. No language interpreter was used.    Past Medical History:  Diagnosis Date  . Anemia   . Asthma    no hospitalizations, no recent attacks (since childhood)  . Complication of anesthesia    one sided epidural  . History of gestational hypertension   . Hypertension   . Ovarian cyst   . Vaginal Pap smear, abnormal     Patient Active Problem List   Diagnosis Date Noted  . 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 BMI 50.0-59.9, adult (HCC) 06/03/2018  . History of herpes genitalis 06/03/2018    Past Surgical History:  Procedure Laterality Date  . CESAREAN SECTION N/A 06/28/2018   Procedure: CESAREAN SECTION;  Surgeon: Levie Heritage, DO;  Location: [facility] BIRTHING SUITES;  Service: Obstetrics;  Laterality: N/A;  . THERAPEUTIC ABORTION       OB History    Gravida  3   Para  1   Term  1   Preterm      AB  1   Living  1     SAB      TAB      Ectopic      Multiple  0   Live Births  1            Home Medications    Prior to Admission medications   Medication Sig Start Date End Date Taking? Authorizing Provider  ibuprofen (ADVIL,MOTRIN) 600 MG tablet Take 1 tablet (600 mg total) by mouth every 6 (six) hours. Patient not taking: Reported on 09/23/2018 06/30/18   Tilda Burrow, MD  Levonorgestrel (LILETTA, 52 MG,) 19.5  MCG/DAY IUD IUD 1 each by Intrauterine route once.    [provider]  naproxen (NAPROSYN) 500 MG tablet Take 1 tablet (500 mg total) by mouth 2 (two) times daily with a meal. Take for bleeding and pain 09/23/18   Anyanwu, Jethro Bastos, MD    Family History Family History  Problem Relation Age of Onset  . Hypertension Mother   . Hypertension Father   . Diabetes Paternal Grandfather     Social History Social History   Tobacco Use  . Smoking status: Never Smoker  . Smokeless tobacco: Never Used  Substance Use Topics  . Alcohol use: No  . Drug use: No     Allergies   Penicillins   Review of Systems Review of Systems  All other systems reviewed and are negative.    Physical Exam Updated Vital Signs BP (!) 160/99   Pulse 86   Temp 98.3 F (36.8 C) (Oral)   Resp 16   Ht 5\' 7"  (1.702 m)   Wt 127 kg   SpO2 100%   BMI 43.85 kg/m   Physical Exam Vitals signs and nursing note reviewed.  Constitutional:  Appearance: She is well-developed.  HENT:     Head: Normocephalic and atraumatic.     Mouth/Throat:     Comments: Oropharynx mildly erythematous, no tonsillar exudate, no abscess Eyes:     Conjunctiva/sclera: Conjunctivae normal.     Pupils: Pupils are equal, round, and reactive to light.  Neck:     Musculoskeletal: Normal range of motion and neck supple.  Cardiovascular:     Rate and Rhythm: Normal rate and regular rhythm.     Heart sounds: No murmur. No friction rub. No gallop.   Pulmonary:     Effort: Pulmonary effort is normal. No respiratory distress.     Breath sounds: Normal breath sounds. No wheezing or rales.     Comments: Lungs are clear to auscultation Chest:     Chest wall: No tenderness.  Abdominal:     General: Bowel sounds are normal. There is no distension.     Palpations: Abdomen is soft. There is no mass.     Tenderness: There is no abdominal tenderness. There is no guarding or rebound.  Musculoskeletal: Normal range of motion.         General: No tenderness.  Skin:    General: Skin is warm and dry.  Neurological:     Mental Status: She is alert and oriented to person, place, and time.  Psychiatric:        Behavior: Behavior normal.        Thought Content: Thought content normal.        Judgment: Judgment normal.      ED Treatments / Results  Labs (all labs ordered are listed, but only abnormal results are displayed) Labs Reviewed  GROUP A STREP BY PCR  INFLUENZA PANEL BY PCR (TYPE A & B)    EKG None  Radiology No results found.  Procedures Procedures (including critical care time)  Medications Ordered in ED Medications - No data to display   Initial Impression / Assessment and Plan / ED Course  I have reviewed the triage vital signs and the nursing notes.  Pertinent labs & imaging results that were available during my care of the patient were reviewed by me and considered in my medical decision making (see chart for details).        Pt afebrile without tonsillar exudate, negative strep. Presents with mild cervical lymphadenopathy, & dysphagia; diagnosis of viral pharyngitis. No abx indicated. DC w symptomatic tx for pain  Pt does not appear dehydrated, but did discuss importance of water rehydration. Presentation non concerning for PTA or infxn spread to soft tissue. No trismus or uvula deviation. Specific return precautions discussed. Pt able to drink water in ED without difficulty with intact air way. Recommended PCP follow up.   Final Clinical Impressions(s) / ED Diagnoses   Final diagnoses:  Pharyngitis, unspecified etiology    ED Discharge Orders    None       Roxy Horseman, PA-C 02/01/19 3614    Glynn Octave, MD 02/01/19 [phone]

## 2019-10-22 ENCOUNTER — Encounter: Payer: Self-pay | Admitting: Obstetrics and Gynecology

## 2019-10-22 ENCOUNTER — Other Ambulatory Visit: Payer: Self-pay

## 2019-10-22 ENCOUNTER — Ambulatory Visit (INDEPENDENT_AMBULATORY_CARE_PROVIDER_SITE_OTHER): Payer: Medicaid Other | Admitting: Obstetrics and Gynecology

## 2019-10-22 ENCOUNTER — Other Ambulatory Visit (HOSPITAL_COMMUNITY)
Admission: RE | Admit: 2019-10-22 | Discharge: 2019-10-22 | Disposition: A | Discharge status: Home / Self Care | Payer: Medicaid Other | Source: Ambulatory Visit | Attending: Obstetrics and Gynecology | Admitting: Obstetrics and Gynecology

## 2019-10-22 VITALS — BP 120/72 | HR 78 | Wt 235.2 lb

## 2019-10-22 DIAGNOSIS — M2550 Pain in unspecified joint: Secondary | ICD-10-CM

## 2019-10-22 DIAGNOSIS — R8290 Unspecified abnormal findings in urine: Secondary | ICD-10-CM

## 2019-10-22 DIAGNOSIS — T8332XA Displacement of intrauterine contraceptive device, initial encounter: Secondary | ICD-10-CM

## 2019-10-22 DIAGNOSIS — Z803 Family history of malignant neoplasm of breast: Secondary | ICD-10-CM | POA: Insufficient documentation

## 2019-10-22 DIAGNOSIS — Z01419 Encounter for gynecological examination (general) (routine) without abnormal findings: Secondary | ICD-10-CM | POA: Diagnosis present

## 2019-10-22 DIAGNOSIS — N939 Abnormal uterine and vaginal bleeding, unspecified: Secondary | ICD-10-CM

## 2019-10-22 DIAGNOSIS — Z0000 Encounter for general adult medical examination without abnormal findings: Secondary | ICD-10-CM | POA: Diagnosis not present

## 2019-10-22 LAB — POCT URINE QUALITATIVE DIPSTICK BLOOD: Blood, UA: NEGATIVE

## 2019-10-22 NOTE — Progress Notes (Addendum)
Obstetrics and Gynecology Annual Patient Evaluation  Appointment Date: 10/22/2019  OBGYN Clinic: Center for [facility]  Primary Care Provider: Tomasa Hose A  Chief Complaint:  Chief Complaint  Patient presents with  . Gynecologic Exam    History of Present Illness: Tiffany Sanchez is a 23 y.o. African-American G3P1011 (Patient's last menstrual period was 10/09/2019.), seen for the above chief complaint. Her past medical history is significant for HTN, BMI 30s   Joint pain: for past few months but has started to exercise more. No prior h/o s/s  AUB: has period about q2wks and heavy  Malodorous urine: no dysuria, hematuria  FHx of breast cancer: 4 relatives with first diagnosed age 50 but doesn't sound like any 1st degree ones. Patient denies any breast s/s.   Review of Systems: as noted in the History of Present Illness.  Past Medical History:  Past Medical History:  Diagnosis Date  . Anemia   . Asthma    no hospitalizations, no recent attacks (since childhood)  . Cervical dysplasia   . Complication of anesthesia    one sided epidural  . History of gestational hypertension   . Hypertension   . Ovarian cyst     Past Surgical History:  Past Surgical History:  Procedure Laterality Date  . CESAREAN SECTION N/A 06/28/2018   Procedure: CESAREAN SECTION;  Surgeon: Truett Mainland, DO;  Location: [place];  Service: Obstetrics;  Laterality: N/A;  . THERAPEUTIC ABORTION      Past Obstetrical History:  OB History  Gravida Para Term Preterm AB Living  3 1 1   1 1   SAB TAB Ectopic Multiple Live Births        0 1    # Outcome Date GA Lbr Len/2nd Weight Sex Delivery Anes PTL Lv  3 Gravida           2 Term 04/14/17 [redacted]w[redacted]d / 00:17 8 lb 4.3 oz (3.75 kg) M Vag-Spont EPI  LIV     Birth Comments: N/A  1 AB             Past Gynecological History: As per HPI. History of Pap Smear(s): Yes.   Last pap 2019, which was negative She is currently using  Blair IUD for contraception. Placed 07/2018  Social History:  Social History   Socioeconomic History  . Marital status: Single    Spouse name: Not on file  . Number of children: Not on file  . Years of education: Not on file  . Highest education level: Not on file  Occupational History  . Not on file  Social Needs  . Financial resource strain: Not on file  . Food insecurity    Worry: Not on file    Inability: Not on file  . Transportation needs    Medical: Not on file    Non-medical: Not on file  Tobacco Use  . Smoking status: Never Smoker  . Smokeless tobacco: Never Used  Substance and Sexual Activity  . Alcohol use: No  . Drug use: No  . Sexual activity: Not Currently  Lifestyle  . Physical activity    Days per week: Not on file    Minutes per session: Not on file  . Stress: Not on file  Relationships  . Social Herbalist on phone: Not on file    Gets together: Not on file    Attends religious service: Not on file    Active member of club or  organization: Not on file    Attends meetings of clubs or organizations: Not on file    Relationship status: Not on file  . Intimate partner violence    Fear of current or ex partner: Not on file    Emotionally abused: Not on file    Physically abused: Not on file    Forced sexual activity: Not on file  Other Topics Concern  . Not on file  Social History Narrative  . Not on file    Family History:  Family History  Problem Relation Age of Onset  . Hypertension Mother   . Hypertension Father   . Diabetes Paternal Grandfather     Medications Jhade I. Clift had no medications administered during this visit. Current Outpatient Medications  Medication Sig Dispense Refill  . Levonorgestrel (LILETTA, 52 MG,) 19.5 MCG/DAY IUD IUD 1 each by Intrauterine route once.     No current facility-administered medications for this visit.     Allergies Penicillins   Physical Exam:  BP 120/72   Pulse 78   Wt  235 lb 3.2 oz (106.7 kg)   LMP 10/09/2019   BMI 36.84 kg/m  Body mass index is 36.84 kg/m. General appearance: Well nourished, well developed female in no acute distress.  Neck:  Supple, normal appearance, and no thyromegaly  Cardiovascular: normal s1 and s2.  No murmurs, rubs or gallops. Respiratory:  Clear to auscultation bilateral. Normal respiratory effort Abdomen: positive bowel sounds and no masses, hernias; diffusely non tender to palpation, non distended Neuro/Psych:  Normal mood and affect.  Skin:  Warm and dry.  Lymphatic:  No inguinal lymphadenopathy.   Pelvic exam: is not limited by body habitus EGBUS: within normal limits, Vagina: within normal limits and with no blood or discharge in the vault, Cervix: nabothian cyst seen. normal appearing cervix without tenderness, discharge or lesions. IUD strings NOT seen. Uterus:  nonenlarged and non tender and Adnexa:  normal adnexa and no mass, fullness, tenderness Rectovaginal: deferred  Laboratory: U/A dip negative  Radiology: none  Assessment: pt stable. Lost IUD  Plan:  1. Women's annual routine gynecological examination Patient gets routine labs with PCP, including STI bloodwork Recommend next pap in 2022.  - Cervicovaginal ancillary only( [place]) - POCT urine qual dipstick blood - Ambulatory referral to Genetics - US PELVIS TRANSVAGINAL NON-OB (TV ONLY); Future  2. Well woman exam  3. Arthralgia, unspecified joint Recommend d/w PCP  4. Abnormal uterine bleeding (AUB) Will get pelvic u/s. If malpositioned then d/w her that can consider 1-52m of conventions OCPs which should be fine since BP is fine on no meds and for a limited course  5. Intrauterine contraceptive device threads lost, initial encounter See above - US PELVIS TRANSVAGINAL NON-OB (TV ONLY); Future  6. Family history of breast cancer - Ambulatory referral to Genetics  7. Malodorous urine - Urine culture  RTC after u/s  Cornelia Copa  MD Attending Center for [facility])

## 2019-10-22 NOTE — Progress Notes (Signed)
Last Pap- 08/01/2018 Patient would like to discuss her irregular cycle and joint pain since having her IUD place.

## 2019-10-23 LAB — URINE CULTURE

## 2019-10-26 ENCOUNTER — Other Ambulatory Visit: Payer: Self-pay | Admitting: *Deleted

## 2019-10-26 ENCOUNTER — Telehealth: Payer: Self-pay

## 2019-10-26 DIAGNOSIS — T8332XA Displacement of intrauterine contraceptive device, initial encounter: Secondary | ICD-10-CM

## 2019-10-26 LAB — CERVICOVAGINAL ANCILLARY ONLY
Bacterial Vaginitis (gardnerella): NEGATIVE
Candida Glabrata: NEGATIVE
Candida Vaginitis: NEGATIVE
Chlamydia: NEGATIVE
Comment: NEGATIVE
Comment: NEGATIVE
Comment: NEGATIVE
Comment: NEGATIVE
Comment: NEGATIVE
Comment: NORMAL
Neisseria Gonorrhea: NEGATIVE
Trichomonas: NEGATIVE

## 2019-10-26 NOTE — Telephone Encounter (Signed)
-----   Message from Aletha Halim, MD sent at 10/26/2019  8:52 AM EST ----- All normal. Can you let her know? Thanks!

## 2019-10-26 NOTE — Telephone Encounter (Signed)
Call patient to inform her of test results. 

## 2019-10-27 ENCOUNTER — Ambulatory Visit
Admission: RE | Admit: 2019-10-27 | Discharge: 2019-10-27 | Disposition: A | Discharge status: Home / Self Care | Payer: Medicaid Other | Source: Ambulatory Visit | Attending: Obstetrics and Gynecology | Admitting: Obstetrics and Gynecology

## 2019-10-27 ENCOUNTER — Other Ambulatory Visit: Payer: Self-pay | Admitting: *Deleted

## 2019-10-27 ENCOUNTER — Other Ambulatory Visit: Payer: Self-pay

## 2019-10-27 DIAGNOSIS — T8332XA Displacement of intrauterine contraceptive device, initial encounter: Secondary | ICD-10-CM

## 2019-10-28 ENCOUNTER — Other Ambulatory Visit: Payer: Self-pay | Admitting: *Deleted

## 2019-10-28 ENCOUNTER — Ambulatory Visit
Admission: RE | Admit: 2019-10-28 | Discharge: 2019-10-28 | Disposition: A | Discharge status: Home / Self Care | Payer: Medicaid Other | Source: Ambulatory Visit | Attending: Obstetrics and Gynecology | Admitting: Obstetrics and Gynecology

## 2019-10-28 ENCOUNTER — Telehealth: Payer: Self-pay | Admitting: Genetic Counselor

## 2019-10-28 ENCOUNTER — Ambulatory Visit
Admission: RE | Admit: 2019-10-28 | Discharge: 2019-10-28 | Disposition: A | Discharge status: Home / Self Care | Payer: Medicaid Other | Attending: Obstetrics and Gynecology | Admitting: Obstetrics and Gynecology

## 2019-10-28 DIAGNOSIS — T8332XA Displacement of intrauterine contraceptive device, initial encounter: Secondary | ICD-10-CM

## 2019-10-28 NOTE — Telephone Encounter (Signed)
Called patient regarding upcoming virtual visit, per patient's request appointment is cancelled. Patient will give a call back when ready to reschedule.

## 2019-10-29 ENCOUNTER — Other Ambulatory Visit: Payer: Self-pay

## 2019-10-29 ENCOUNTER — Inpatient Hospital Stay: Payer: Medicaid Other | Admitting: Genetic Counselor

## 2019-10-29 ENCOUNTER — Ambulatory Visit (INDEPENDENT_AMBULATORY_CARE_PROVIDER_SITE_OTHER): Payer: Medicaid Other | Admitting: Obstetrics and Gynecology

## 2019-10-29 DIAGNOSIS — T8332XD Displacement of intrauterine contraceptive device, subsequent encounter: Secondary | ICD-10-CM | POA: Diagnosis not present

## 2019-10-29 DIAGNOSIS — Z3201 Encounter for pregnancy test, result positive: Secondary | ICD-10-CM | POA: Diagnosis not present

## 2019-10-29 NOTE — Progress Notes (Signed)
Obstetrics and Gynecology Visit Return Patient Evaluation  Appointment Date: 10/29/2019  Primary Care Provider: Donnie Coffin  OBGYN Clinic: Center for [facility]  Chief Complaint: follow up lost IUD  History of Present Illness:  Tiffany Sanchez is a 23 y.o. here for follow up lost IUD. Pt seen on 12/3 and on annual exam no iud strings seen. U/s ordered and no IUD seen and also not seen on subsequent abdominal x-ray. Pt thought she may be pregnant so took UPT from medical office and it was faintly positive today.   Pt is asymptomatic. She does state her last few periods were heavy  Review of Systems:  as noted in the History of Present Illness.  Medications:  None  Allergies: is allergic to penicillins.  Physical Exam:  LMP 10/09/2019 Comment: negative preg test yesterday There is no height or weight on file to calculate BMI. General appearance: Well nourished, well developed female in no acute distress.   Assessment: ?early pregnancy, expelled IUD  Plan:  1. Positive urine pregnancy test - Beta hCG quant (ref lab); Future - Beta hCG quant (ref lab)  2. Intrauterine contraceptive device threads lost, subsequent encounter Base plan on beta hcgs.    RTC: PRN  Durene Romans MD Attending Center for [employer] [facility])

## 2019-10-30 ENCOUNTER — Telehealth: Payer: Self-pay | Admitting: *Deleted

## 2019-10-30 LAB — BETA HCG QUANT (REF LAB): hCG Quant: 19 m[IU]/mL

## 2019-10-30 NOTE — Telephone Encounter (Signed)
Pt informed of HCG result and needing to repeat lab on Monday. Pt verbalizes and understands.

## 2019-10-30 NOTE — Telephone Encounter (Signed)
-----   Message from Aletha Halim, MD sent at 10/30/2019  8:32 AM EST ----- Pt to call and set up a repeat beta hcg visit for monday

## 2019-10-30 NOTE — Telephone Encounter (Signed)
-----   Message from Charlie Pickens, MD sent at 10/30/2019  8:32 AM EST ----- Pt to call and set up a repeat beta hcg visit for monday 

## 2019-10-30 NOTE — Telephone Encounter (Signed)
Left message for pt to call back  °

## 2019-11-02 ENCOUNTER — Other Ambulatory Visit: Payer: Medicaid Other

## 2019-11-03 ENCOUNTER — Other Ambulatory Visit: Payer: Medicaid Other

## 2019-11-03 ENCOUNTER — Telehealth: Payer: Self-pay | Admitting: Radiology

## 2019-11-03 NOTE — Telephone Encounter (Signed)
Called patient after no show for second time for repeat HCG, states that she isn't coming she already knows that her lab results will be positive. I can celled appointment and patient disconnected

## 2019-11-09 ENCOUNTER — Ambulatory Visit: Payer: Medicaid Other | Admitting: Obstetrics and Gynecology

## 2019-11-15 ENCOUNTER — Other Ambulatory Visit: Payer: Self-pay

## 2019-11-15 ENCOUNTER — Emergency Department (HOSPITAL_COMMUNITY): Payer: Medicaid Other

## 2019-11-15 ENCOUNTER — Emergency Department (HOSPITAL_COMMUNITY)
Admission: EM | Admit: 2019-11-15 | Discharge: 2019-11-15 | Disposition: A | Discharge status: Home / Self Care | Payer: Medicaid Other | Attending: Emergency Medicine | Admitting: Emergency Medicine

## 2019-11-15 ENCOUNTER — Encounter (HOSPITAL_COMMUNITY): Payer: Self-pay | Admitting: Emergency Medicine

## 2019-11-15 DIAGNOSIS — I10 Essential (primary) hypertension: Secondary | ICD-10-CM | POA: Diagnosis not present

## 2019-11-15 DIAGNOSIS — J45909 Unspecified asthma, uncomplicated: Secondary | ICD-10-CM | POA: Insufficient documentation

## 2019-11-15 DIAGNOSIS — Z79899 Other long term (current) drug therapy: Secondary | ICD-10-CM | POA: Diagnosis not present

## 2019-11-15 DIAGNOSIS — R110 Nausea: Secondary | ICD-10-CM | POA: Insufficient documentation

## 2019-11-15 DIAGNOSIS — R102 Pelvic and perineal pain: Secondary | ICD-10-CM | POA: Diagnosis not present

## 2019-11-15 DIAGNOSIS — N939 Abnormal uterine and vaginal bleeding, unspecified: Secondary | ICD-10-CM | POA: Insufficient documentation

## 2019-11-15 DIAGNOSIS — R42 Dizziness and giddiness: Secondary | ICD-10-CM | POA: Insufficient documentation

## 2019-11-15 LAB — I-STAT BETA HCG BLOOD, ED (MC, WL, AP ONLY): I-stat hCG, quantitative: 44.9 m[IU]/mL — ABNORMAL HIGH (ref <5)

## 2019-11-15 LAB — CBC
HCT: 33.8 % — ABNORMAL LOW (ref 36.0–46.0)
Hemoglobin: 10.5 g/dL — ABNORMAL LOW (ref 12.0–15.0)
MCH: 26.4 pg (ref 26.0–34.0)
MCHC: 31.1 g/dL (ref 30.0–36.0)
MCV: 85.1 fL (ref 80.0–100.0)
Platelets: 258 10*3/uL (ref 150–400)
RBC: 3.97 MIL/uL (ref 3.87–5.11)
RDW: 13.2 % (ref 11.5–15.5)
WBC: 7.3 10*3/uL (ref 4.0–10.5)
nRBC: 0 % (ref 0.0–0.2)

## 2019-11-15 LAB — URINALYSIS, ROUTINE W REFLEX MICROSCOPIC
Bacteria, UA: NONE SEEN
RBC / HPF: >50 RBC/hpf — ABNORMAL HIGH (ref 0–5)
WBC, UA: >50 WBC/hpf — ABNORMAL HIGH (ref 0–5)

## 2019-11-15 LAB — HCG, QUANTITATIVE, PREGNANCY: hCG, Beta Chain, Quant, S: 57 m[IU]/mL — ABNORMAL HIGH (ref <5)

## 2019-11-15 LAB — COMPREHENSIVE METABOLIC PANEL
ALT: 13 U/L (ref 0–44)
AST: 15 U/L (ref 15–41)
Albumin: 3.9 g/dL (ref 3.5–5.0)
Alkaline Phosphatase: 27 U/L — ABNORMAL LOW (ref 38–126)
Anion gap: 6 (ref 5–15)
BUN: 9 mg/dL (ref 6–20)
CO2: 25 mmol/L (ref 22–32)
Calcium: 9 mg/dL (ref 8.9–10.3)
Chloride: 109 mmol/L (ref 98–111)
Creatinine, Ser: 0.69 mg/dL (ref 0.44–1.00)
GFR calc Af Amer: >60 mL/min (ref >60)
GFR calc non Af Amer: >60 mL/min (ref >60)
Glucose, Bld: 90 mg/dL (ref 70–99)
Potassium: 3.6 mmol/L (ref 3.5–5.1)
Sodium: 140 mmol/L (ref 135–145)
Total Bilirubin: 0.7 mg/dL (ref 0.3–1.2)
Total Protein: 6.6 g/dL (ref 6.5–8.1)

## 2019-11-15 LAB — LIPASE, BLOOD: Lipase: 21 U/L (ref 11–51)

## 2019-11-15 MED ORDER — ONDANSETRON 4 MG PO TBDP
8.0000 mg | ORAL_TABLET | Freq: Once | ORAL | Status: AC
  Administered 2019-11-15: 8 mg via ORAL
  Filled 2019-11-15: qty 2

## 2019-11-15 MED ORDER — SODIUM CHLORIDE 0.9% FLUSH: 3.0000 mL | Freq: Once | INTRAVENOUS | Status: DC

## 2019-11-15 MED ORDER — MISOPROSTOL 200 MCG PO TABS
600.0000 ug | ORAL_TABLET | Freq: Once | ORAL | Status: AC
  Administered 2019-11-15: 16:00:00 600 ug via ORAL
  Filled 2019-11-15: qty 3

## 2019-11-15 NOTE — ED Triage Notes (Signed)
Pt had an abortion on Tuesday.  Reports abd pain, back pain, and vaginal bleeding x 2 days.  Reports using 4 pads in 1 hour.  Diarrhea and nausea yesterday.

## 2019-11-15 NOTE — Discharge Instructions (Addendum)
Please follow up with your OB/GYN for recheck in 2-3 days. If your bleeding increases, you have severe pain, run a fever, become increasingly lightheaded, please return to the emergency department for further evaluation.

## 2019-11-15 NOTE — ED Provider Notes (Signed)
[facility] EMERGENCY DEPARTMENT Provider Note   CSN: [id number] Arrival date & time: 11/15/19  1044     History Chief Complaint  Patient presents with  . Vaginal Bleeding  . Abdominal Pain    Tiffany Sanchez is a 23 y.o. female.  Patient to ED with heavy vaginal bleeding for the past 3 days with lower abdominal cramping. She reports being seen in a clinic for an abortion on 12/22 and given oral medication to end her pregnancy. She states she took the medicine as prescribed, completing the course the following day. Two days later she started having vaginal bleeding that became heavier and has persisted. She reports being lightheaded now when she stands up but denies syncope. She has had nausea without vomiting.   The history is provided by the patient. No language interpreter was used.  Vaginal Bleeding Associated symptoms: abdominal pain and nausea   Associated symptoms: no fever   Abdominal Pain Associated symptoms: nausea and vaginal bleeding   Associated symptoms: no chest pain, no chills, no fever, no shortness of breath and no vomiting        Past Medical History:  Diagnosis Date  . Anemia   . Asthma    no hospitalizations, no recent attacks (since childhood)  . Cervical dysplasia   . Complication of anesthesia    one sided epidural  . History of gestational hypertension   . Hypertension   . Ovarian cyst     Patient Active Problem List   Diagnosis Date Noted  . Family history of breast cancer 10/22/2019  . 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 BMI 50.0-59.9, adult ([place]) 06/03/2018  . History of herpes genitalis 06/03/2018    Past Surgical History:  Procedure Laterality Date  . CESAREAN SECTION N/A 06/28/2018   Procedure: CESAREAN SECTION;  Surgeon: Truett Mainland, DO;  Location: [facility];  Service: Obstetrics;  Laterality: N/A;  . THERAPEUTIC ABORTION       OB History    Gravida  3   Para  1   Term  1   Preterm       AB  1   Living  1     SAB      TAB      Ectopic      Multiple  0   Live Births  1           Family History  Problem Relation Age of Onset  . Hypertension Mother   . Hypertension Father   . Diabetes Paternal Grandfather     Social History   Tobacco Use  . Smoking status: Never Smoker  . Smokeless tobacco: Never Used  Substance Use Topics  . Alcohol use: No  . Drug use: No    Home Medications Prior to Admission medications   Medication Sig Start Date End Date Taking? Authorizing Provider  ibuprofen (ADVIL,MOTRIN) 600 MG tablet Take 1 tablet (600 mg total) by mouth every 6 (six) hours. Patient not taking: Reported on 09/23/2018 06/30/18   Jonnie Kind, MD  Levonorgestrel (LILETTA, 52 MG,) 19.5 MCG/DAY IUD IUD 1 each by Intrauterine route once.    [provider]  naproxen (NAPROSYN) 500 MG tablet Take 1 tablet (500 mg total) by mouth 2 (two) times daily with a meal. Take for bleeding and pain 09/23/18   Anyanwu, Sallyanne Havers, MD    Allergies    Penicillins  Review of Systems   Review of Systems  Constitutional: Negative for chills and fever.  Respiratory: Negative.  Negative for shortness of breath.   Cardiovascular: Negative.  Negative for chest pain.  Gastrointestinal: Positive for abdominal pain and nausea. Negative for vomiting.  Genitourinary: Positive for vaginal bleeding.  Musculoskeletal: Negative.   Skin: Negative.   Neurological: Positive for light-headedness. Negative for syncope.    Physical Exam Updated Vital Signs BP (!) 134/58 (BP Location: Left Arm)   Pulse 95   Temp 98.9 F (37.2 C) (Oral)   Resp 16   SpO2 97%   Physical Exam Constitutional:      Appearance: She is well-developed.  Pulmonary:     Effort: Pulmonary effort is normal.  Musculoskeletal:     Cervical back: Normal range of motion.  Skin:    General: Skin is warm and dry.  Neurological:     Mental Status: She is alert and oriented to person, place, and  time.     ED Results / Procedures / Treatments   Labs (all labs ordered are listed, but only abnormal results are displayed) Labs Reviewed  COMPREHENSIVE METABOLIC PANEL - Abnormal; Notable for the following components:      Result Value   Alkaline Phosphatase 27 (*)    All other components within normal limits  CBC - Abnormal; Notable for the following components:   Hemoglobin 10.5 (*)    HCT 33.8 (*)    All other components within normal limits  I-STAT BETA HCG BLOOD, ED (MC, WL, AP ONLY) - Abnormal; Notable for the following components:   I-stat hCG, quantitative 44.9 (*)    All other components within normal limits  LIPASE, BLOOD  URINALYSIS, ROUTINE W REFLEX MICROSCOPIC    EKG None  Radiology No results found.  Procedures Procedures (including critical care time)  Medications Ordered in ED Medications  sodium chloride flush (NS) 0.9 % injection 3 mL (has no administration in time range)    ED Course  I have reviewed the triage vital signs and the nursing notes.  Pertinent labs & imaging results that were available during my care of the patient were reviewed by me and considered in my medical decision making (see chart for details).    MDM Rules/Calculators/A&P                      Patient to ED with heavy vaginal bleeding as described in the HPI.   She is not anemic, she is hemodynamically stable. Discussed with Dr. Adrian Blackwater, OB/GYN who recommended transvaginal US to eval for retained products of conception. Quant HCG added to labs.   [place] does not show any retained products of conception. These results were discussed with dr. Adrian Blackwater who recommends 600 mcg Cytotec x1 dose.   The patient was updated on results and plan. She is comfortable with discharge home. Will refer to her GYN, Dr. Vergie Living, for further management. Discussed return precautions.  Final Clinical Impression(s) / ED Diagnoses Final diagnoses:  None   1. Vaginal bleeding  Rx / DC Orders ED  Discharge Orders    None       Elpidio Anis, PA-C 11/15/19 1507    Terrilee Files, MD 11/15/19 [phone]

## 2019-11-15 NOTE — ED Notes (Signed)
Patient transported to Ultrasound 

## 2019-12-10 ENCOUNTER — Other Ambulatory Visit (INDEPENDENT_AMBULATORY_CARE_PROVIDER_SITE_OTHER): Payer: Medicaid Other

## 2019-12-10 ENCOUNTER — Other Ambulatory Visit: Payer: Self-pay

## 2019-12-10 VITALS — BP 135/78 | HR 72

## 2019-12-10 DIAGNOSIS — N926 Irregular menstruation, unspecified: Secondary | ICD-10-CM | POA: Diagnosis not present

## 2019-12-10 DIAGNOSIS — Z3202 Encounter for pregnancy test, result negative: Secondary | ICD-10-CM

## 2019-12-10 NOTE — Progress Notes (Signed)
[name] presents today for UPT. She reports having an abortion on the 11/09/2019 and has had unprotected sex a couple times since having an abortion.. She now reports having pregnancy symptoms  And requested we order an HCG level since her pregnancy test was negative.   Urine LMP: Patient reports around November 2020.    OBJECTIVE: Appears well, in no apparent distress.  OB History    Gravida  3   Para  1   Term  1   Preterm      AB  1   Living  1     SAB      TAB      Ectopic      Multiple  0   Live Births  1          Home UPT Result: In-Office UPT result: I have reviewed the patient's medical, obstetrical, social, and family histories, and medications.   ASSESSMENT: Negative pregnancy test .Patient was offered STD testing since she had unprotected sex but, patient declined at this time.   PLAN Prenatal care to be completed at: HCG level order for patient will follow up once results come back. Patient voice understanding at this time.

## 2019-12-11 LAB — BETA HCG QUANT (REF LAB): hCG Quant: <1 m[IU]/mL

## 2019-12-15 ENCOUNTER — Telehealth: Payer: Self-pay | Admitting: Radiology

## 2019-12-15 NOTE — Telephone Encounter (Signed)
ERROR

## 2019-12-21 ENCOUNTER — Ambulatory Visit: Payer: Medicaid Other | Admitting: Obstetrics and Gynecology

## 2020-02-03 ENCOUNTER — Encounter: Payer: Self-pay | Admitting: Advanced Practice Midwife

## 2020-02-03 ENCOUNTER — Other Ambulatory Visit: Payer: Self-pay

## 2020-02-03 ENCOUNTER — Other Ambulatory Visit (HOSPITAL_COMMUNITY)
Admission: RE | Admit: 2020-02-03 | Discharge: 2020-02-03 | Disposition: A | Discharge status: Home / Self Care | Payer: Medicaid Other | Source: Ambulatory Visit | Attending: Advanced Practice Midwife | Admitting: Advanced Practice Midwife

## 2020-02-03 ENCOUNTER — Ambulatory Visit (INDEPENDENT_AMBULATORY_CARE_PROVIDER_SITE_OTHER): Payer: Medicaid Other | Admitting: Advanced Practice Midwife

## 2020-02-03 VITALS — BP 121/79 | HR 84 | Wt 241.0 lb

## 2020-02-03 DIAGNOSIS — Z3009 Encounter for other general counseling and advice on contraception: Secondary | ICD-10-CM | POA: Diagnosis not present

## 2020-02-03 DIAGNOSIS — Z113 Encounter for screening for infections with a predominantly sexual mode of transmission: Secondary | ICD-10-CM | POA: Diagnosis present

## 2020-02-03 LAB — POCT URINE PREGNANCY: Preg Test, Ur: NEGATIVE

## 2020-02-03 NOTE — Patient Instructions (Signed)

## 2020-02-03 NOTE — Progress Notes (Signed)
Would like to get on OCP's, IUD fell out In November and did get pregnant and had a TAB.

## 2020-02-03 NOTE — Progress Notes (Signed)
CONTRACEPTION COUNSELING ENCOUNTER NOTE  History:     Tiffany Sanchez is a 24 y.o. [phone] female here for contraception counseling.  Current complaints: None.   Denies abnormal vaginal bleeding, discharge, pelvic pain, problems with intercourse or other gynecologic concerns.    Patient is s/p identification of expelled IUD followed by pregnancy termination 11/10/2019.    Gynecologic History Patient's last menstrual period was 01/18/2020 (exact date). Contraception: abstinence Last Pap: 07/2018. Results were: normal with negative HPV  Obstetric History OB History  Gravida Para Term Preterm AB Living  4 2 2   2 2   SAB TAB Ectopic Multiple Live Births    1   0 2    # Outcome Date GA Lbr Len/2nd Weight Sex Delivery Anes PTL Lv  4 TAB 11/10/19     TAB     3 Term 06/2018    M CS-LTranv   LIV  2 Term 04/14/17 [redacted]w[redacted]d / 00:17 8 lb 4.3 oz (3.75 kg) M Vag-Spont EPI  LIV     Birth Comments: N/A  1 AB 08/2014     TAB       Past Medical History:  Diagnosis Date  . Anemia   . Asthma    no hospitalizations, no recent attacks (since childhood)  . Cervical dysplasia   . Complication of anesthesia    one sided epidural  . History of gestational hypertension   . Hypertension   . Ovarian cyst     Past Surgical History:  Procedure Laterality Date  . CESAREAN SECTION N/A 06/28/2018   Procedure: CESAREAN SECTION;  Surgeon: Truett Mainland, DO;  Location: [facility];  Service: Obstetrics;  Laterality: N/A;  . THERAPEUTIC ABORTION      Current Outpatient Medications on File Prior to Visit  Medication Sig Dispense Refill  . phentermine 15 MG capsule Take 15 mg by mouth every morning.     No current facility-administered medications on file prior to visit.    Allergies  Allergen Reactions  . Penicillins Other (See Comments)    Unknown reaction-childhood allergy  Has patient had a PCN reaction causing immediate rash, facial/tongue/throat swelling, SOB or lightheadedness with  hypotension: Unknown Has patient had a PCN reaction causing severe rash involving mucus membranes or skin necrosis: Unknown Has patient had a PCN reaction that required hospitalization: Unknown Has patient had a PCN reaction occurring within the last 10 years: Unknown If all of the above answers are "NO", then may proceed with Cephalosporin use.     Social History:  reports that she has never smoked. She has never used smokeless tobacco. She reports that she does not drink alcohol or use drugs.  Family History  Problem Relation Age of Onset  . Hypertension Mother   . Hypertension Father   . Diabetes Paternal Grandfather     The following portions of the patient's history were reviewed and updated as appropriate: allergies, current medications, past family history, past medical history, past social history, past surgical history and problem list.  Review of Systems Pertinent items noted in HPI and remainder of comprehensive ROS otherwise negative.  Physical Exam:  BP 121/79   Pulse 84   Wt 241 lb (109.3 kg)   LMP 01/18/2020 (Exact Date)   BMI 37.75 kg/m  CONSTITUTIONAL: Well-developed, well-nourished female in no acute distress.  HENT:  Normocephalic, atraumatic, External right and left ear normal. Oropharynx is clear and moist EYES: Conjunctivae and EOM are normal. Pupils are equal, round, and reactive  to light. No scleral icterus.  NECK: Normal range of motion, supple, no masses.  Normal thyroid.  SKIN: Skin is warm and dry. No rash noted. Not diaphoretic. No erythema. No pallor. MUSCULOSKELETAL: Normal range of motion. No tenderness.  No cyanosis, clubbing, or edema.  2+ distal pulses. NEUROLOGIC: Alert and oriented to person, place, and time. Normal reflexes, muscle tone coordination.  PSYCHIATRIC: Normal mood and affect. Normal behavior. Normal judgment and thought content. RESPIRATORY: Effort normal, no problems with respiration noted.   Assessment and Plan:    1.  Encounter for counseling regarding contraception -Reviewed all forms of birth control options available including abstinence; fertility period awareness methods; over the counter/barrier methods; hormonal contraceptive medication including pill, patch, ring, injection,contraceptive implant; hormonal and nonhormonal IUDs; permanent sterilization options including vasectomy and the various tubal sterilization modalities. Risks and benefits reviewed.  Questions were answered.  Information was given to patient to review.   --Discussed CDC MEC with patient's current BMI, Hypertension, desire for most effective possible contraception. Agree with patient's stated preference for IUD placement  2. Routine screening for STI (sexually transmitted infection) - Self swab - Hepatitis B surface antigen - Hepatitis C antibody - HIV Antibody (routine testing w rflx) - RPR - Cervicovaginal ancillary only - POCT urine pregnancy  Routine preventative health maintenance measures emphasized. Please refer to After Visit Summary for other counseling recommendations.     Follow-up: --RTC PRN for IUD placement --Patient offered open slot on my morning calendar in one hour.  --Offered to work patient into my afternoon schedule if she can accommodate  Total visit time 30 minutes. Greater than 50% of visit spent in counseling and coordination of care.  Clayton Bibles, MSN, CNM Certified Nurse Midwife, [employer] for [employer],  [facility] 02/03/20 12:02 PM

## 2020-02-04 LAB — CERVICOVAGINAL ANCILLARY ONLY
Bacterial Vaginitis (gardnerella): NEGATIVE
Candida Glabrata: NEGATIVE
Candida Vaginitis: NEGATIVE
Chlamydia: NEGATIVE
Comment: NEGATIVE
Comment: NEGATIVE
Comment: NEGATIVE
Comment: NEGATIVE
Comment: NEGATIVE
Comment: NORMAL
Neisseria Gonorrhea: NEGATIVE
Trichomonas: NEGATIVE

## 2020-02-04 LAB — HEPATITIS C ANTIBODY: Hep C Virus Ab: <0.1 s/co ratio (ref 0.0–0.9)

## 2020-02-04 LAB — RPR: RPR Ser Ql: NONREACTIVE

## 2020-02-04 LAB — HEPATITIS B SURFACE ANTIGEN: Hepatitis B Surface Ag: NEGATIVE

## 2020-02-04 LAB — HIV ANTIBODY (ROUTINE TESTING W REFLEX): HIV Screen 4th Generation wRfx: NONREACTIVE

## 2020-02-10 ENCOUNTER — Ambulatory Visit: Payer: Medicaid Other | Admitting: Family Medicine

## 2020-02-17 ENCOUNTER — Other Ambulatory Visit: Payer: Self-pay

## 2020-02-17 ENCOUNTER — Ambulatory Visit (INDEPENDENT_AMBULATORY_CARE_PROVIDER_SITE_OTHER): Payer: Medicaid Other | Admitting: Advanced Practice Midwife

## 2020-02-17 DIAGNOSIS — Z30430 Encounter for insertion of intrauterine contraceptive device: Secondary | ICD-10-CM | POA: Diagnosis not present

## 2020-02-17 DIAGNOSIS — Z3202 Encounter for pregnancy test, result negative: Secondary | ICD-10-CM | POA: Diagnosis not present

## 2020-02-17 LAB — POCT URINE QUALITATIVE DIPSTICK BLOOD: Blood, UA: POSITIVE

## 2020-02-17 LAB — POCT URINE PREGNANCY: Preg Test, Ur: NEGATIVE

## 2020-02-17 MED ORDER — LEVONORGESTREL 20 MCG/24HR IU IUD: INTRAUTERINE_SYSTEM | Freq: Once | INTRAUTERINE | Status: AC

## 2020-02-17 NOTE — Patient Instructions (Signed)

## 2020-02-17 NOTE — Progress Notes (Signed)
    GYNECOLOGY OFFICE PROCEDURE NOTE  Tiffany Sanchez is a 24 y.o. [phone] here for Mirena IUD insertion. No GYN concerns.  Last pap smear was on 08/01/2018 and was normal.  IUD Insertion Procedure Note Patient identified, informed consent performed, consent signed.   Discussed risks of irregular bleeding, cramping, infection, malpositioning or misplacement of the IUD outside the uterus which may require further procedure such as laparoscopy. Also discussed >99% contraception efficacy, increased risk of ectopic pregnancy with failure of method.  Time out was performed.  Urine pregnancy test negative.  Speculum placed in the vagina.  Cervix visualized.  Cleaned with Betadine x 2.  Uterus sounded to 8 cm.  Mirena IUD placed per manufacturer's recommendations.  Strings trimmed to 3 cm. Good hemostasis noted.  Patient tolerated procedure well.   Patient was given post-procedure instructions.  She was advised to have backup contraception for one week.  Patient was also asked to check IUD strings periodically and follow up in 4 weeks for IUD check.   Clayton Bibles, MSN, CNM Certified Nurse Midwife, [employer] for [employer], [facility] 02/17/20 8:45 AM

## 2020-03-16 ENCOUNTER — Ambulatory Visit: Payer: Medicaid Other | Admitting: Advanced Practice Midwife

## 2020-03-16 ENCOUNTER — Other Ambulatory Visit: Payer: Self-pay

## 2020-03-16 ENCOUNTER — Encounter (HOSPITAL_COMMUNITY): Payer: Self-pay | Admitting: *Deleted

## 2020-03-16 ENCOUNTER — Emergency Department (HOSPITAL_COMMUNITY)
Admission: EM | Admit: 2020-03-16 | Discharge: 2020-03-16 | Disposition: A | Discharge status: Home / Self Care | Payer: Medicaid Other | Attending: Emergency Medicine | Admitting: Emergency Medicine

## 2020-03-16 DIAGNOSIS — R509 Fever, unspecified: Secondary | ICD-10-CM | POA: Diagnosis present

## 2020-03-16 DIAGNOSIS — R519 Headache, unspecified: Secondary | ICD-10-CM | POA: Diagnosis not present

## 2020-03-16 DIAGNOSIS — Z5321 Procedure and treatment not carried out due to patient leaving prior to being seen by health care provider: Secondary | ICD-10-CM | POA: Diagnosis not present

## 2020-03-16 LAB — CBC
HCT: 34 % — ABNORMAL LOW (ref 36.0–46.0)
Hemoglobin: 10 g/dL — ABNORMAL LOW (ref 12.0–15.0)
MCH: 23.8 pg — ABNORMAL LOW (ref 26.0–34.0)
MCHC: 29.4 g/dL — ABNORMAL LOW (ref 30.0–36.0)
MCV: 81 fL (ref 80.0–100.0)
Platelets: 230 10*3/uL (ref 150–400)
RBC: 4.2 MIL/uL (ref 3.87–5.11)
RDW: 16.2 % — ABNORMAL HIGH (ref 11.5–15.5)
WBC: 8.6 10*3/uL (ref 4.0–10.5)
nRBC: 0 % (ref 0.0–0.2)

## 2020-03-16 LAB — COMPREHENSIVE METABOLIC PANEL
ALT: 15 U/L (ref 0–44)
AST: 17 U/L (ref 15–41)
Albumin: 3.9 g/dL (ref 3.5–5.0)
Alkaline Phosphatase: 31 U/L — ABNORMAL LOW (ref 38–126)
Anion gap: 10 (ref 5–15)
BUN: 13 mg/dL (ref 6–20)
CO2: 19 mmol/L — ABNORMAL LOW (ref 22–32)
Calcium: 8.8 mg/dL — ABNORMAL LOW (ref 8.9–10.3)
Chloride: 107 mmol/L (ref 98–111)
Creatinine, Ser: 0.81 mg/dL (ref 0.44–1.00)
GFR calc Af Amer: >60 mL/min (ref >60)
GFR calc non Af Amer: >60 mL/min (ref >60)
Glucose, Bld: 104 mg/dL — ABNORMAL HIGH (ref 70–99)
Potassium: 4 mmol/L (ref 3.5–5.1)
Sodium: 136 mmol/L (ref 135–145)
Total Bilirubin: 1 mg/dL (ref 0.3–1.2)
Total Protein: 7 g/dL (ref 6.5–8.1)

## 2020-03-16 LAB — I-STAT BETA HCG BLOOD, ED (MC, WL, AP ONLY): I-stat hCG, quantitative: <5 m[IU]/mL (ref <5)

## 2020-03-16 LAB — LIPASE, BLOOD: Lipase: 21 U/L (ref 11–51)

## 2020-03-16 MED ORDER — SODIUM CHLORIDE 0.9% FLUSH: 3.0000 mL | Freq: Once | INTRAVENOUS | Status: DC

## 2020-03-16 MED ORDER — ACETAMINOPHEN 325 MG PO TABS
650.0000 mg | ORAL_TABLET | Freq: Once | ORAL | Status: AC | PRN
  Administered 2020-03-16: 08:00:00 650 mg via ORAL
  Filled 2020-03-16: qty 2

## 2020-03-16 NOTE — ED Notes (Signed)
No answer to recheck vitals

## 2020-03-16 NOTE — ED Triage Notes (Signed)
Pt reports having headache that started yesterday. Having bodyaches and n/v that started last night. Febrile at triage.

## 2020-03-17 ENCOUNTER — Emergency Department (HOSPITAL_COMMUNITY): Payer: Medicaid Other

## 2020-03-17 ENCOUNTER — Other Ambulatory Visit: Payer: Self-pay

## 2020-03-17 ENCOUNTER — Emergency Department (HOSPITAL_COMMUNITY)
Admission: EM | Admit: 2020-03-17 | Discharge: 2020-03-18 | Disposition: A | Discharge status: Home / Self Care | Payer: Medicaid Other | Attending: Emergency Medicine | Admitting: Emergency Medicine

## 2020-03-17 ENCOUNTER — Encounter (HOSPITAL_COMMUNITY): Payer: Self-pay | Admitting: Emergency Medicine

## 2020-03-17 ENCOUNTER — Emergency Department
Admission: EM | Admit: 2020-03-17 | Discharge: 2020-03-17 | Disposition: A | Discharge status: Home / Self Care | Payer: Medicaid Other | Attending: Emergency Medicine | Admitting: Emergency Medicine

## 2020-03-17 DIAGNOSIS — D649 Anemia, unspecified: Secondary | ICD-10-CM | POA: Diagnosis not present

## 2020-03-17 DIAGNOSIS — Z20822 Contact with and (suspected) exposure to covid-19: Secondary | ICD-10-CM | POA: Insufficient documentation

## 2020-03-17 DIAGNOSIS — Z79899 Other long term (current) drug therapy: Secondary | ICD-10-CM | POA: Insufficient documentation

## 2020-03-17 DIAGNOSIS — I10 Essential (primary) hypertension: Secondary | ICD-10-CM | POA: Diagnosis not present

## 2020-03-17 DIAGNOSIS — J45909 Unspecified asthma, uncomplicated: Secondary | ICD-10-CM | POA: Diagnosis not present

## 2020-03-17 DIAGNOSIS — R519 Headache, unspecified: Secondary | ICD-10-CM | POA: Diagnosis present

## 2020-03-17 DIAGNOSIS — M542 Cervicalgia: Secondary | ICD-10-CM | POA: Diagnosis present

## 2020-03-17 DIAGNOSIS — B349 Viral infection, unspecified: Secondary | ICD-10-CM | POA: Diagnosis not present

## 2020-03-17 DIAGNOSIS — G44209 Tension-type headache, unspecified, not intractable: Secondary | ICD-10-CM | POA: Insufficient documentation

## 2020-03-17 DIAGNOSIS — R509 Fever, unspecified: Secondary | ICD-10-CM | POA: Insufficient documentation

## 2020-03-17 LAB — COMPREHENSIVE METABOLIC PANEL
ALT: 20 U/L (ref 0–44)
AST: 19 U/L (ref 15–41)
Albumin: 3.5 g/dL (ref 3.5–5.0)
Alkaline Phosphatase: 29 U/L — ABNORMAL LOW (ref 38–126)
Anion gap: 6 (ref 5–15)
BUN: 11 mg/dL (ref 6–20)
CO2: 24 mmol/L (ref 22–32)
Calcium: 8.8 mg/dL — ABNORMAL LOW (ref 8.9–10.3)
Chloride: 107 mmol/L (ref 98–111)
Creatinine, Ser: 0.77 mg/dL (ref 0.44–1.00)
GFR calc Af Amer: >60 mL/min (ref >60)
GFR calc non Af Amer: >60 mL/min (ref >60)
Glucose, Bld: 119 mg/dL — ABNORMAL HIGH (ref 70–99)
Potassium: 3.4 mmol/L — ABNORMAL LOW (ref 3.5–5.1)
Sodium: 137 mmol/L (ref 135–145)
Total Bilirubin: 1.1 mg/dL (ref 0.3–1.2)
Total Protein: 6.9 g/dL (ref 6.5–8.1)

## 2020-03-17 LAB — CBC WITH DIFFERENTIAL/PLATELET
Abs Immature Granulocytes: 0.02 10*3/uL (ref 0.00–0.07)
Basophils Absolute: 0 10*3/uL (ref 0.0–0.1)
Basophils Relative: 1 %
Eosinophils Absolute: 0 10*3/uL (ref 0.0–0.5)
Eosinophils Relative: 1 %
HCT: 32.7 % — ABNORMAL LOW (ref 36.0–46.0)
Hemoglobin: 9.7 g/dL — ABNORMAL LOW (ref 12.0–15.0)
Immature Granulocytes: 0 %
Lymphocytes Relative: 15 %
Lymphs Abs: 1 10*3/uL (ref 0.7–4.0)
MCH: 23.5 pg — ABNORMAL LOW (ref 26.0–34.0)
MCHC: 29.7 g/dL — ABNORMAL LOW (ref 30.0–36.0)
MCV: 79.2 fL — ABNORMAL LOW (ref 80.0–100.0)
Monocytes Absolute: 0.6 10*3/uL (ref 0.1–1.0)
Monocytes Relative: 9 %
Neutro Abs: 5 10*3/uL (ref 1.7–7.7)
Neutrophils Relative %: 74 %
Platelets: 217 10*3/uL (ref 150–400)
RBC: 4.13 MIL/uL (ref 3.87–5.11)
RDW: 16.3 % — ABNORMAL HIGH (ref 11.5–15.5)
WBC: 6.6 10*3/uL (ref 4.0–10.5)
nRBC: 0 % (ref 0.0–0.2)

## 2020-03-17 LAB — BASIC METABOLIC PANEL
Anion gap: 8 (ref 5–15)
BUN: 15 mg/dL (ref 6–20)
CO2: 21 mmol/L — ABNORMAL LOW (ref 22–32)
Calcium: 8.7 mg/dL — ABNORMAL LOW (ref 8.9–10.3)
Chloride: 108 mmol/L (ref 98–111)
Creatinine, Ser: 0.9 mg/dL (ref 0.44–1.00)
GFR calc Af Amer: >60 mL/min (ref >60)
GFR calc non Af Amer: >60 mL/min (ref >60)
Glucose, Bld: 121 mg/dL — ABNORMAL HIGH (ref 70–99)
Potassium: 3.5 mmol/L (ref 3.5–5.1)
Sodium: 137 mmol/L (ref 135–145)

## 2020-03-17 LAB — URINALYSIS, ROUTINE W REFLEX MICROSCOPIC
Bacteria, UA: NONE SEEN
Bilirubin Urine: NEGATIVE
Glucose, UA: NEGATIVE mg/dL
Ketones, ur: 5 mg/dL — AB
Nitrite: NEGATIVE
Protein, ur: >=300 mg/dL — AB
RBC / HPF: >50 RBC/hpf — ABNORMAL HIGH (ref 0–5)
Specific Gravity, Urine: 1.04 — ABNORMAL HIGH (ref 1.005–1.030)
WBC, UA: >50 WBC/hpf — ABNORMAL HIGH (ref 0–5)
pH: 5 (ref 5.0–8.0)

## 2020-03-17 LAB — CBC
HCT: 32.4 % — ABNORMAL LOW (ref 36.0–46.0)
Hemoglobin: 9.6 g/dL — ABNORMAL LOW (ref 12.0–15.0)
MCH: 23.4 pg — ABNORMAL LOW (ref 26.0–34.0)
MCHC: 29.6 g/dL — ABNORMAL LOW (ref 30.0–36.0)
MCV: 79 fL — ABNORMAL LOW (ref 80.0–100.0)
Platelets: 201 10*3/uL (ref 150–400)
RBC: 4.1 MIL/uL (ref 3.87–5.11)
RDW: 16.4 % — ABNORMAL HIGH (ref 11.5–15.5)
WBC: 6.5 10*3/uL (ref 4.0–10.5)
nRBC: 0 % (ref 0.0–0.2)

## 2020-03-17 LAB — RESPIRATORY PANEL BY RT PCR (FLU A&B, COVID)
Influenza A by PCR: NEGATIVE
Influenza B by PCR: NEGATIVE
SARS Coronavirus 2 by RT PCR: NEGATIVE

## 2020-03-17 MED ORDER — IBUPROFEN 400 MG PO TABS
400.0000 mg | ORAL_TABLET | Freq: Once | ORAL | Status: AC | PRN
  Administered 2020-03-17: 15:00:00 400 mg via ORAL
  Filled 2020-03-17: qty 1

## 2020-03-17 MED ORDER — IOHEXOL 350 MG/ML SOLN
75.0000 mL | Freq: Once | INTRAVENOUS | Status: AC | PRN
  Administered 2020-03-17: 75 mL via INTRAVENOUS

## 2020-03-17 MED ORDER — DIPHENHYDRAMINE HCL 50 MG/ML IJ SOLN
25.0000 mg | Freq: Once | INTRAMUSCULAR | Status: AC
  Administered 2020-03-17: 22:00:00 25 mg via INTRAVENOUS
  Filled 2020-03-17: qty 1

## 2020-03-17 MED ORDER — ACETAMINOPHEN 500 MG PO TABS
1000.0000 mg | ORAL_TABLET | Freq: Once | ORAL | Status: AC
  Administered 2020-03-17: 22:00:00 1000 mg via ORAL
  Filled 2020-03-17: qty 2

## 2020-03-17 MED ORDER — ACETAMINOPHEN 500 MG PO TABS: 500.0000 mg | ORAL_TABLET | Freq: Once | ORAL | Status: DC

## 2020-03-17 MED ORDER — PROCHLORPERAZINE EDISYLATE 10 MG/2ML IJ SOLN
10.0000 mg | Freq: Once | INTRAMUSCULAR | Status: AC
  Administered 2020-03-17: 22:00:00 10 mg via INTRAVENOUS
  Filled 2020-03-17: qty 2

## 2020-03-17 NOTE — ED Notes (Signed)
See triage note  Presents with low grade fever,body aches and headache   States sx's started on Tuesday  States she did have vomiting on Tuesday   Also states that her fever was 102 at home

## 2020-03-17 NOTE — ED Triage Notes (Addendum)
Per EMS- pt seen at [facility] earlier for same. Was tested for flu and covid, both negative. Pt states stiffness in neck, pain to posterior neck and bottom of posterior head with fever body aches. Took 500mg  of tylenol @ 12.

## 2020-03-17 NOTE — ED Triage Notes (Signed)
Pt here with body aches, headache and fever that started Tues night. Temp was 102.5 at home.

## 2020-03-17 NOTE — ED Provider Notes (Signed)
[facility] Emergency Department Provider Note  ____________________________________________  Time seen: Approximately 8:52 AM  I have reviewed the triage vital signs and the nursing notes.   HISTORY  Chief Complaint Headache   HPI Tiffany Sanchez is a 24 y.o. female who presents to the emergency department for treatment and evaluation of headache and fever with bodyaches. Symptoms started 2 days ago. Some relief with tylenol and ibuprofen.   Past Medical History:  Diagnosis Date  . Anemia   . Asthma    no hospitalizations, no recent attacks (since childhood)  . Cervical dysplasia   . Complication of anesthesia    one sided epidural  . History of gestational hypertension   . Hypertension   . Ovarian cyst     Patient Active Problem List   Diagnosis Date Noted  . Encounter for IUD insertion 02/17/2020  . Family history of breast cancer 10/22/2019  . 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 BMI 50.0-59.9, adult ([place]) 06/03/2018  . History of herpes genitalis 06/03/2018    Past Surgical History:  Procedure Laterality Date  . CESAREAN SECTION N/A 06/28/2018   Procedure: CESAREAN SECTION;  Surgeon: Truett Mainland, DO;  Location: [place];  Service: Obstetrics;  Laterality: N/A;  . THERAPEUTIC ABORTION      Prior to Admission medications   Medication Sig Start Date End Date Taking? Authorizing Provider  phentermine 15 MG capsule Take 15 mg by mouth every morning.    [provider]    Allergies Penicillins  Family History  Problem Relation Age of Onset  . Hypertension Mother   . Hypertension Father   . Diabetes Paternal Grandfather     Social History Social History   Tobacco Use  . Smoking status: Never Smoker  . Smokeless tobacco: Never Used  Substance Use Topics  . Alcohol use: No  . Drug use: No    Review of Systems Constitutional: Positive for fever/chills. Normal appetite. ENT: Negative for sore  throat. Cardiovascular: Denies chest pain. Respiratory: Negative for shortness of breath. Positive for cough. Negative for wheezing.  Gastrointestinal: 1 episode of nausea and vomiting No diarrhea.  Musculoskeletal: Positive for body aches Skin: Negative for rash. Neurological: Positive for headaches ____________________________________________   PHYSICAL EXAM:  VITAL SIGNS: ED Triage Vitals  Enc Vitals Group     BP 03/17/20 0734 133/76     Pulse Rate 03/17/20 0734 (!) 111     Resp 03/17/20 0734 16     Temp 03/17/20 0734 99.4 F (37.4 C)     Temp Source 03/17/20 0734 Oral     SpO2 03/17/20 0734 99 %     Weight 03/17/20 0735 235 lb (106.6 kg)     Height 03/17/20 0735 5\' 7"  (1.702 m)     Head Circumference --      Peak Flow --      Pain Score 03/17/20 0734 7     Pain Loc --      Pain Edu? --      Excl. in [place]? --     Constitutional: Alert and oriented. Well appearing and in no acute distress. Eyes: Conjunctivae are normal. Ears: TM normal Nose: No sinus congestion noted; no rhinnorhea. Mouth/Throat: Mucous membranes are moist.  Oropharynx normal. Tonsils normal. Uvula midline. Neck: No stridor.  Lymphatic: No cervical lymphadenopathy. Cardiovascular: Normal rate, regular rhythm. Good peripheral circulation. Respiratory: Respirations are even and unlabored.  No retractions. Breath sounds clear. Gastrointestinal: Soft and nontender.  Musculoskeletal: FROM x 4 extremities.  Neurologic:  Normal speech and language. Skin:  Skin is warm, dry and intact. No rash noted. Psychiatric: Mood and affect are normal. Speech and behavior are normal.  ____________________________________________   LABS (all labs ordered are listed, but only abnormal results are displayed)  Labs Reviewed  CBC - Abnormal; Notable for the following components:      Result Value   Hemoglobin 9.6 (*)    HCT 32.4 (*)    MCV 79.0 (*)    MCH 23.4 (*)    MCHC 29.6 (*)    RDW 16.4 (*)    All other  components within normal limits  BASIC METABOLIC PANEL - Abnormal; Notable for the following components:   CO2 21 (*)    Glucose, Bld 121 (*)    Calcium 8.7 (*)    All other components within normal limits  RESPIRATORY PANEL BY RT PCR (FLU A&B, COVID)  URINALYSIS, COMPLETE (UACMP) WITH MICROSCOPIC   ____________________________________________  EKG  NOt indicated. ____________________________________________  RADIOLOGY  NOt indicated. ____________________________________________   PROCEDURES  Procedure(s) performed: None  Critical Care performed: No ____________________________________________   INITIAL IMPRESSION / ASSESSMENT AND PLAN / ED COURSE  24 y.o. female presenting to the emergency department for treatment and evaluation of fever and body aches.  See HPI for further details.  Labs are overall reassuring with the exception of anemia that seems to be a little worse today than on previous lab draws.  Patient does state that she is currently on her menstrual cycle.  She had  an IUD inserted at the end of March and states that she has had some vaginal bleeding since.  She was advised that she will need to call her gynecologist for a follow-up visit.  COVID-19 testing is pending.  She will be notified when the test results are back.  Overall, the patient looks stable and is ready for discharge. She was advised to take tylenol and ibuprofen in rotation for headache, fever, and body aches.   Medications - No data to display  ED Discharge Orders    None       Pertinent labs & imaging results that were available during my care of the patient were reviewed by me and considered in my medical decision making (see chart for details).    If controlled substance prescribed during this visit, 12 month history viewed on the [facility] prior to issuing an initial prescription for Schedule II or III opiod. ____________________________________________   FINAL CLINICAL IMPRESSION(S)  / ED DIAGNOSES  Final diagnoses:  Acute viral syndrome  Anemia, unspecified type    Note:  This document was prepared using Dragon voice recognition software and may include unintentional dictation errors.    Chinita Pester, FNP 03/17/20 [id number]    Phineas Semen, MD 03/17/20 [phone]

## 2020-03-17 NOTE — ED Notes (Signed)
Pt aware of need for urine sample, call bell within reach

## 2020-03-17 NOTE — Discharge Instructions (Signed)
Please follow up with gynecology to discuss your menstrual cycles if not regulated in 3 months.  Follow up with primary care for headache.

## 2020-03-18 NOTE — ED Provider Notes (Addendum)
[facility] EMERGENCY DEPARTMENT Provider Note   CSN: [id number] Arrival date & time: 03/17/20  1442     History Chief Complaint  Patient presents with  . Neck Pain  . Headache    Tiffany Sanchez is a 24 y.o. female.  Presents to ER with chief complaint of neck pain, headache, fever.  States that she initially had headache starting on Tuesday then developed some neck pain on Wednesday and subsequently had a fever.  Also having some generalized body aches.  Pain is not sudden onset, all symptoms have been steadily progressing.  Up to 8 out of 10 in severity, headache is dull, frontal.  Neck is crampy, achy, worse across lower neck.  Nonradiating.  No chest pain, difficulty breathing or cough.  No abdominal pain, vomiting or diarrhea.  No new vaginal discharge, no dysuria.  Currently on menstrual cycle.  Bleeding normal.  Chart review, ER visit this morning at [facility], Covid and flu negative.  HPI     Past Medical History:  Diagnosis Date  . Anemia   . Asthma    no hospitalizations, no recent attacks (since childhood)  . Cervical dysplasia   . Complication of anesthesia    one sided epidural  . History of gestational hypertension   . Hypertension   . Ovarian cyst     Patient Active Problem List   Diagnosis Date Noted  . Encounter for IUD insertion 02/17/2020  . Family history of breast cancer 10/22/2019  . 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 BMI 50.0-59.9, adult ([place]) 06/03/2018  . History of herpes genitalis 06/03/2018    Past Surgical History:  Procedure Laterality Date  . CESAREAN SECTION N/A 06/28/2018   Procedure: CESAREAN SECTION;  Surgeon: Truett Mainland, DO;  Location: [facility];  Service: Obstetrics;  Laterality: N/A;  . THERAPEUTIC ABORTION       OB History    Gravida  4   Para  2   Term  2   Preterm      AB  2   Living  2     SAB      TAB  1   Ectopic      Multiple  0   Live Births  2             Family History  Problem Relation Age of Onset  . Hypertension Mother   . Hypertension Father   . Diabetes Paternal Grandfather     Social History   Tobacco Use  . Smoking status: Never Smoker  . Smokeless tobacco: Never Used  Substance Use Topics  . Alcohol use: No  . Drug use: No    Home Medications Prior to Admission medications   Medication Sig Start Date End Date Taking? Authorizing Provider  albuterol (VENTOLIN HFA) 108 (90 Base) MCG/ACT inhaler Inhale 2 puffs into the lungs every 4 (four) hours as needed. 11/30/19  Yes [provider]  cetirizine (ZYRTEC) 10 MG tablet Take 10 mg by mouth daily.   Yes [provider]  fluticasone (FLONASE) 50 MCG/ACT nasal spray Place 1 spray into both nostrils daily.   Yes [provider]  Multiple Vitamin (MULTIVITAMIN WITH MINERALS) TABS tablet Take 1 tablet by mouth daily.   Yes [provider]  Multiple Vitamins-Minerals (HAIR SKIN AND NAILS FORMULA PO) Take 1 tablet by mouth daily.   Yes [provider]    Allergies    Penicillins  Review of  Systems   Review of Systems  Constitutional: Positive for chills, fatigue and fever.  HENT: Negative for ear pain and sore throat.   Eyes: Negative for pain and visual disturbance.  Respiratory: Negative for cough and shortness of breath.   Cardiovascular: Negative for chest pain and palpitations.  Gastrointestinal: Negative for abdominal pain and vomiting.  Genitourinary: Negative for dysuria and hematuria.  Musculoskeletal: Negative for arthralgias and back pain.  Skin: Negative for color change and rash.  Neurological: Positive for headaches. Negative for seizures and syncope.  All other systems reviewed and are negative.   Physical Exam Updated Vital Signs BP 130/69   Pulse 100   Temp 99.9 F (37.7 C) (Oral)   Resp 18   LMP 03/16/2020   SpO2 96%   Physical Exam Vitals and nursing note reviewed.  Constitutional:       General: She is not in acute distress.    Appearance: She is well-developed.  HENT:     Head: Normocephalic and atraumatic.  Eyes:     Conjunctiva/sclera: Conjunctivae normal.  Neck:     Comments: Neck is supple, pt reports some pain with ROM but has full ROM Cardiovascular:     Rate and Rhythm: Normal rate and regular rhythm.     Heart sounds: No murmur.  Pulmonary:     Effort: Pulmonary effort is normal. No respiratory distress.     Breath sounds: Normal breath sounds.  Abdominal:     Palpations: Abdomen is soft.     Tenderness: There is no abdominal tenderness.  Skin:    General: Skin is warm and dry.  Neurological:     Mental Status: She is alert and oriented to person, place, and time.     GCS: GCS eye subscore is 4. GCS verbal subscore is 5. GCS motor subscore is 6.     Motor: No weakness.     Gait: Gait normal.  Psychiatric:        Mood and Affect: Mood normal.        Behavior: Behavior normal.     ED Results / Procedures / Treatments   Labs (all labs ordered are listed, but only abnormal results are displayed) Labs Reviewed  CBC WITH DIFFERENTIAL/PLATELET - Abnormal; Notable for the following components:      Result Value   Hemoglobin 9.7 (*)    HCT 32.7 (*)    MCV 79.2 (*)    MCH 23.5 (*)    MCHC 29.7 (*)    RDW 16.3 (*)    All other components within normal limits  COMPREHENSIVE METABOLIC PANEL - Abnormal; Notable for the following components:   Potassium 3.4 (*)    Glucose, Bld 119 (*)    Calcium 8.8 (*)    Alkaline Phosphatase 29 (*)    All other components within normal limits  URINALYSIS, ROUTINE W REFLEX MICROSCOPIC - Abnormal; Notable for the following components:   Color, Urine AMBER (*)    APPearance CLOUDY (*)    Specific Gravity, Urine 1.040 (*)    Hgb urine dipstick LARGE (*)    Ketones, ur 5 (*)    Protein, ur >=300 (*)    Leukocytes,Ua TRACE (*)    RBC / HPF >50 (*)    WBC, UA >50 (*)    All other components within normal limits  CSF  CULTURE  GRAM STAIN  CSF CELL COUNT WITH DIFFERENTIAL  CSF CELL COUNT WITH DIFFERENTIAL  PROTEIN AND GLUCOSE, CSF    EKG None  Radiology CT Angio Head W or Wo Contrast  Result Date: 03/17/2020 CLINICAL DATA:  Subarachnoid hemorrhage suspected. EXAM: CT ANGIOGRAPHY HEAD AND NECK TECHNIQUE: Multidetector CT imaging of the head and neck was performed using the standard protocol during bolus administration of intravenous contrast. Multiplanar CT image reconstructions and MIPs were obtained to evaluate the vascular anatomy. Carotid stenosis measurements (when applicable) are obtained utilizing NASCET criteria, using the distal internal carotid diameter as the denominator. CONTRAST:  64mL OMNIPAQUE IOHEXOL 350 MG/ML SOLN COMPARISON:  None. FINDINGS: CT HEAD FINDINGS Brain: No evidence of acute infarction, hemorrhage, hydrocephalus, extra-axial collection or mass lesion/mass effect. Vascular: Negative for hyperdense vessel Skull: Negative Sinuses: Negative Orbits: Negative Review of the MIP images confirms the above findings CTA NECK FINDINGS Aortic arch: Standard branching. Imaged portion shows no evidence of aneurysm or dissection. No significant stenosis of the major arch vessel origins. Right carotid system: Normal right carotid without stenosis or dissection Left carotid system: Normal left carotid without stenosis or dissection Vertebral arteries: Both vertebral arteries widely patent without stenosis Skeleton: Negative Other neck: Negative Upper chest: Lung apices clear bilaterally. Review of the MIP images confirms the above findings CTA HEAD FINDINGS Anterior circulation: Cavernous carotid widely patent bilaterally without stenosis or aneurysm. Anterior and middle cerebral arteries patent bilaterally without stenosis or aneurysm. Posterior circulation: Both vertebral arteries patent to the basilar. PICA patent bilaterally. Basilar widely patent. AICA, superior cerebellar, and posterior cerebral  arteries are patent bilaterally. Negative for aneurysm. 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 No filling defect. No evidence of venous sinus thrombosis. Anatomic variants: None Review of the MIP images confirms the above findings IMPRESSION: 1. Negative CT head.  Negative for subarachnoid hemorrhage 2. Negative CTA head 3. Negative CTA neck Electronically Signed   By: Marlan Palau M.D.   On: 03/17/2020 20:54   CT Angio Neck W and/or Wo Contrast  Result Date: 03/17/2020 CLINICAL DATA:  Subarachnoid hemorrhage suspected. EXAM: CT ANGIOGRAPHY HEAD AND NECK TECHNIQUE: Multidetector CT imaging of the head and neck was performed using the standard protocol during bolus administration of intravenous contrast. Multiplanar CT image reconstructions and MIPs were obtained to evaluate the vascular anatomy. Carotid stenosis measurements (when applicable) are obtained utilizing NASCET criteria, using the distal internal carotid diameter as the denominator. CONTRAST:  24mL OMNIPAQUE IOHEXOL 350 MG/ML SOLN COMPARISON:  None. FINDINGS: CT HEAD FINDINGS Brain: No evidence of acute infarction, hemorrhage, hydrocephalus, extra-axial collection or mass lesion/mass effect. Vascular: Negative for hyperdense vessel Skull: Negative Sinuses: Negative Orbits: Negative Review of the MIP images confirms the above findings CTA NECK FINDINGS Aortic arch: Standard branching. Imaged portion shows no evidence of aneurysm or dissection. No significant stenosis of the major arch vessel origins. Right carotid system: Normal right carotid without stenosis or dissection Left carotid system: Normal left carotid without stenosis or dissection Vertebral arteries: Both vertebral arteries widely patent without stenosis Skeleton: Negative Other neck: Negative Upper chest: Lung apices clear bilaterally. Review of the MIP images confirms the above findings CTA HEAD FINDINGS Anterior circulation: Cavernous  carotid widely patent bilaterally without stenosis or aneurysm. Anterior and middle cerebral arteries patent bilaterally without stenosis or aneurysm. Posterior circulation: Both vertebral arteries patent to the basilar. PICA patent bilaterally. Basilar widely patent. AICA, superior cerebellar, and posterior cerebral arteries are patent bilaterally. Negative for aneurysm. 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 No filling defect. No evidence of venous sinus thrombosis. Anatomic variants: None Review of the MIP  images confirms the above findings IMPRESSION: 1. Negative CT head.  Negative for subarachnoid hemorrhage 2. Negative CTA head 3. Negative CTA neck Electronically Signed   By: Marlan Palauharles  Clark M.D.   On: 03/17/2020 20:54   DG Chest Portable 1 View  Result Date: 03/17/2020 CLINICAL DATA:  Shortness of breath EXAM: PORTABLE CHEST 1 VIEW COMPARISON:  None. FINDINGS: The heart size and mediastinal contours are within normal limits. Both lungs are clear. The visualized skeletal structures are unremarkable. IMPRESSION: No active disease. Electronically Signed   By: Jasmine PangKim  Fujinaga M.D.   On: 03/17/2020 19:17   CT VENOGRAM HEAD  Result Date: 03/17/2020 CLINICAL DATA:  Suspect subarachnoid hemorrhage. EXAM: CT VENOGRAM HEAD TECHNIQUE: Following intravenous contrast injection, delayed venous phase scanning of the head was performed. CONTRAST:  75mL OMNIPAQUE IOHEXOL 350 MG/ML SOLN COMPARISON:  CT head earlier today FINDINGS: Hypoplastic right transverse and sigmoid sinus. No filling defect. Left transverse sinus and sigmoid sinus patent but relatively small . No filling defect. Superior sagittal sinus widely patent. Straight sinus patent. IMPRESSION: Negative for dural sinus thrombosis Hypoplastic right transverse sinus. Relatively small left transverse sinus. This may be a congenital variation. Transverse sinus stenosis associated with intracranial hypertension headache  is usually more focal in currently seen on the study however correlate with headaches and papilledema. Electronically Signed   By: Marlan Palauharles  Clark M.D.   On: 03/17/2020 21:01    Procedures .Lumbar Puncture  Date/Time: 03/18/2020 12:38 AM Performed by: Milagros Lollykstra, Thurmon Mizell S, MD Authorized by: Milagros Lollykstra, Syncere Eble S, MD   Consent:    Consent obtained:  Verbal   Consent given by:  Patient   Risks discussed:  Bleeding, headache, nerve damage, infection, pain and repeat procedure   Alternatives discussed:  No treatment, delayed treatment, observation and referral Pre-procedure details:    Procedure purpose:  Diagnostic Anesthesia (see MAR for exact dosages):    Anesthesia method:  Local infiltration   Local anesthetic:  Lidocaine 1% w/o epi Procedure details:    Lumbar space:  L4-L5 interspace   Patient position:  Sitting   Needle gauge:  20   Needle length (in):  3.5   Ultrasound guidance: no     Number of attempts:  2 Comments:     Timeout called, site marked at L4-L5 interspace as well as L3-L4 interspace.  Prepped and draped in typical sterile fashion using betadine.  Local infiltration with 1% lidocaine.  Initially attempted lumbar puncture at L4-L5 interspace, no success.  Subsequently attempted LP at L3-L4 without success.  After discussing further attempts versus observation and referral, patient requested to terminate procedure.  No immediate complications occurred and patient tolerated this procedure well.   (including critical care time)  Medications Ordered in ED Medications  ibuprofen (ADVIL) tablet 400 mg (400 mg Oral Given 03/17/20 1451)  iohexol (OMNIPAQUE) 350 MG/ML injection 75 mL (75 mLs Intravenous Contrast Given 03/17/20 2025)  prochlorperazine (COMPAZINE) injection 10 mg (10 mg Intravenous Given 03/17/20 2217)  diphenhydrAMINE (BENADRYL) injection 25 mg (25 mg Intravenous Given 03/17/20 2217)  acetaminophen (TYLENOL) tablet 1,000 mg (1,000 mg Oral Given 03/17/20 2215)     ED Course  I have reviewed the triage vital signs and the nursing notes.  Pertinent labs & imaging results that were available during my care of the patient were reviewed by me and considered in my medical decision making (see chart for details).    MDM Rules/Calculators/A&P  24 year old lady who presented to ER with concern for headache, neck pain and fever.  On exam patient was nontoxic or ill-appearing however did have some pain with range of motion neck exam.  Given her severe headache neck pain, obtain CT imaging which was negative for acute process, dissection, venous sinus thrombosis, SAH, etc.  Labs grossly wnl. Noted WBCs in urine but no bacteria, no urinary symptoms, doubt clinically significant uti. Given fever, reported neck stiffness and headache, recommended obtaining lumbar puncture.  Attempted LP at bedside however unsuccessful.  Recommended that patient be admitted for further observation and to have fluoroscopy guided LP done tomorrow morning.  Patient states that her symptoms had improved and she strongly desired to go home.  I discussed the risks of discharge without further testing and benefits of admission, patient acknowledged these risks, states she still wants to be discharged.  Patient states that she would be willing to return for any worsening symptoms and is willing to return tomorrow if she continues to have ongoing headache, neck stiffness or fever.  Discharged home.   Final Clinical Impression(s) / ED Diagnoses Final diagnoses:  Tension headache  Acute viral syndrome    Rx / DC Orders ED Discharge Orders    None       Milagros Loll, MD 03/18/20 0037    Milagros Loll, MD 03/18/20 [phone]

## 2020-03-18 NOTE — Discharge Instructions (Addendum)
We recommended admission for observation and to obtain the fluoro guided lumbar puncture tomorrow.  If you change your mind or if you have any worsening of your symptoms, particularly the severe headache, fever and neck stiffness, please come back to the hospital for reassessment.  Continue taking Tylenol Motrin as needed for symptom control.  Would recommend following up with your primary doctor as well.

## 2020-03-19 ENCOUNTER — Other Ambulatory Visit: Payer: Self-pay

## 2020-03-19 ENCOUNTER — Emergency Department
Admission: EM | Admit: 2020-03-19 | Discharge: 2020-03-19 | Disposition: A | Discharge status: Home / Self Care | Payer: Medicaid Other | Attending: Emergency Medicine | Admitting: Emergency Medicine

## 2020-03-19 ENCOUNTER — Emergency Department: Payer: Medicaid Other

## 2020-03-19 DIAGNOSIS — B349 Viral infection, unspecified: Secondary | ICD-10-CM | POA: Diagnosis not present

## 2020-03-19 DIAGNOSIS — A879 Viral meningitis, unspecified: Secondary | ICD-10-CM

## 2020-03-19 DIAGNOSIS — R350 Frequency of micturition: Secondary | ICD-10-CM | POA: Insufficient documentation

## 2020-03-19 DIAGNOSIS — J45909 Unspecified asthma, uncomplicated: Secondary | ICD-10-CM | POA: Insufficient documentation

## 2020-03-19 DIAGNOSIS — Z79899 Other long term (current) drug therapy: Secondary | ICD-10-CM | POA: Insufficient documentation

## 2020-03-19 DIAGNOSIS — I10 Essential (primary) hypertension: Secondary | ICD-10-CM | POA: Diagnosis not present

## 2020-03-19 DIAGNOSIS — H539 Unspecified visual disturbance: Secondary | ICD-10-CM | POA: Diagnosis not present

## 2020-03-19 DIAGNOSIS — R300 Dysuria: Secondary | ICD-10-CM | POA: Insufficient documentation

## 2020-03-19 DIAGNOSIS — R519 Headache, unspecified: Secondary | ICD-10-CM | POA: Diagnosis present

## 2020-03-19 LAB — PROTEIN, CSF: Total  Protein, CSF: 10 mg/dL — ABNORMAL LOW (ref 15–45)

## 2020-03-19 LAB — COMPREHENSIVE METABOLIC PANEL
ALT: 20 U/L (ref 0–44)
AST: 19 U/L (ref 15–41)
Albumin: 3.9 g/dL (ref 3.5–5.0)
Alkaline Phosphatase: 34 U/L — ABNORMAL LOW (ref 38–126)
Anion gap: 9 (ref 5–15)
BUN: 15 mg/dL (ref 6–20)
CO2: 22 mmol/L (ref 22–32)
Calcium: 8.8 mg/dL — ABNORMAL LOW (ref 8.9–10.3)
Chloride: 109 mmol/L (ref 98–111)
Creatinine, Ser: 0.68 mg/dL (ref 0.44–1.00)
GFR calc Af Amer: >60 mL/min (ref >60)
GFR calc non Af Amer: >60 mL/min (ref >60)
Glucose, Bld: 92 mg/dL (ref 70–99)
Potassium: 3.1 mmol/L — ABNORMAL LOW (ref 3.5–5.1)
Sodium: 140 mmol/L (ref 135–145)
Total Bilirubin: 0.7 mg/dL (ref 0.3–1.2)
Total Protein: 8 g/dL (ref 6.5–8.1)

## 2020-03-19 LAB — CSF CELL COUNT WITH DIFFERENTIAL
Eosinophils, CSF: 0 %
Lymphs, CSF: 50 %
Monocyte-Macrophage-Spinal Fluid: 50 %
RBC Count, CSF: 0 /mm3 (ref 0–3)
Segmented Neutrophils-CSF: 0 %
Tube #: 3
WBC, CSF: 7 /mm3 — ABNORMAL HIGH (ref 0–5)

## 2020-03-19 LAB — URINALYSIS, COMPLETE (UACMP) WITH MICROSCOPIC
Bacteria, UA: NONE SEEN
Bilirubin Urine: NEGATIVE
Glucose, UA: NEGATIVE mg/dL
Ketones, ur: NEGATIVE mg/dL
Nitrite: NEGATIVE
Protein, ur: 100 mg/dL — AB
RBC / HPF: >50 RBC/hpf — ABNORMAL HIGH (ref 0–5)
Specific Gravity, Urine: 1.034 — ABNORMAL HIGH (ref 1.005–1.030)
pH: 5 (ref 5.0–8.0)

## 2020-03-19 LAB — CBC WITH DIFFERENTIAL/PLATELET
Abs Immature Granulocytes: 0.02 10*3/uL (ref 0.00–0.07)
Basophils Absolute: 0.1 10*3/uL (ref 0.0–0.1)
Basophils Relative: 1 %
Eosinophils Absolute: 0.2 10*3/uL (ref 0.0–0.5)
Eosinophils Relative: 3 %
HCT: 31.2 % — ABNORMAL LOW (ref 36.0–46.0)
Hemoglobin: 9.6 g/dL — ABNORMAL LOW (ref 12.0–15.0)
Immature Granulocytes: 0 %
Lymphocytes Relative: 17 %
Lymphs Abs: 1.3 10*3/uL (ref 0.7–4.0)
MCH: 23.8 pg — ABNORMAL LOW (ref 26.0–34.0)
MCHC: 30.8 g/dL (ref 30.0–36.0)
MCV: 77.2 fL — ABNORMAL LOW (ref 80.0–100.0)
Monocytes Absolute: 0.8 10*3/uL (ref 0.1–1.0)
Monocytes Relative: 11 %
Neutro Abs: 5.2 10*3/uL (ref 1.7–7.7)
Neutrophils Relative %: 68 %
Platelets: 256 10*3/uL (ref 150–400)
RBC: 4.04 MIL/uL (ref 3.87–5.11)
RDW: 16.3 % — ABNORMAL HIGH (ref 11.5–15.5)
WBC: 7.7 10*3/uL (ref 4.0–10.5)
nRBC: 0 % (ref 0.0–0.2)

## 2020-03-19 LAB — GLUCOSE, CSF: Glucose, CSF: 56 mg/dL (ref 40–70)

## 2020-03-19 LAB — PREGNANCY, URINE: Preg Test, Ur: NEGATIVE

## 2020-03-19 LAB — LACTIC ACID, PLASMA: Lactic Acid, Venous: 0.9 mmol/L (ref 0.5–1.9)

## 2020-03-19 LAB — PROTIME-INR
INR: 1.2 (ref 0.8–1.2)
Prothrombin Time: 14.3 seconds (ref 11.4–15.2)

## 2020-03-19 MED ORDER — KETOROLAC TROMETHAMINE 30 MG/ML IJ SOLN
15.0000 mg | Freq: Once | INTRAMUSCULAR | Status: AC
  Administered 2020-03-19: 15 mg via INTRAVENOUS
  Filled 2020-03-19: qty 1

## 2020-03-19 MED ORDER — SODIUM CHLORIDE 0.9 % IV BOLUS
1000.0000 mL | Freq: Once | INTRAVENOUS | Status: AC
  Administered 2020-03-19: 1000 mL via INTRAVENOUS

## 2020-03-19 MED ORDER — VALACYCLOVIR HCL 500 MG PO TABS
1000.0000 mg | ORAL_TABLET | Freq: Once | ORAL | Status: AC
  Administered 2020-03-19: 1000 mg via ORAL
  Filled 2020-03-19: qty 2

## 2020-03-19 MED ORDER — CEPHALEXIN 500 MG PO CAPS: 500.0000 mg | ORAL_CAPSULE | Freq: Three times a day (TID) | ORAL | 0 refills | Status: AC

## 2020-03-19 MED ORDER — PROCHLORPERAZINE EDISYLATE 10 MG/2ML IJ SOLN
10.0000 mg | Freq: Once | INTRAMUSCULAR | Status: AC
  Administered 2020-03-19: 10 mg via INTRAVENOUS
  Filled 2020-03-19: qty 2

## 2020-03-19 MED ORDER — DIPHENHYDRAMINE HCL 50 MG/ML IJ SOLN
25.0000 mg | Freq: Once | INTRAMUSCULAR | Status: AC
  Administered 2020-03-19: 25 mg via INTRAVENOUS
  Filled 2020-03-19: qty 1

## 2020-03-19 MED ORDER — ONDANSETRON 4 MG PO TBDP
4.0000 mg | ORAL_TABLET | Freq: Three times a day (TID) | ORAL | 0 refills | Status: DC | PRN
Start: 2020-03-19 — End: 2022-03-28

## 2020-03-19 MED ORDER — HYDROCODONE-ACETAMINOPHEN 5-325 MG PO TABS: 1.0000 | ORAL_TABLET | Freq: Four times a day (QID) | ORAL | 0 refills | Status: DC | PRN

## 2020-03-19 MED ORDER — VALACYCLOVIR HCL 1 G PO TABS
1000.0000 mg | ORAL_TABLET | Freq: Three times a day (TID) | ORAL | 0 refills | Status: AC
Start: 2020-03-19 — End: 2020-03-29

## 2020-03-19 NOTE — Discharge Instructions (Signed)
Take the antibiotic and antiviral as prescribed  If you have worsening headache, fevers, confusion, seizures, or other concerning symptoms, return to the ER immediately  Take Ibuprofen 600 mg (3 tablets) for moderate pain and headache at home  Drink 6-8 glasses of water daily  Take the prescribed stronger medicine for severe symptoms

## 2020-03-19 NOTE — ED Notes (Signed)
Pt presents to ER ambulatory with steady gait, pt states that she started having symptoms of headache, neck pain, and fevers on Tuesday, pt states that she has been seen here and at [facility] this week without improvement, pt states that she was told by the ED provider at [facility] to return to the eR if her symptoms hadn't improved, pt states that an LP was attempted and unsuccessful at [facility] ER. Pt states that she took ibuprofen this am prior to arrival to this ER. Pt states that due to cont to feel bad she wanted to be evaluated again and also reports that she is having some burning with urination and that she is currently on her menstrual cycle

## 2020-03-19 NOTE — ED Notes (Signed)
Pt transported to xray 

## 2020-03-19 NOTE — ED Provider Notes (Signed)
[facility] EMERGENCY [facility]MENT Provider Note   CSN: [id number] Arrival date & time: 03/19/20  0848     History Chief Complaint  Patient presents with  . Neck Pain  . Headache    Renee RamusBianca I Starace is a 24 y.o. female.  The history is provided by the patient.   24 year old female with past medical history as below here with headache and fever.  The patient states that for the last several days, she has had persistently worsening generalized headache with photophobia.  She has developed neck stiffness that is worse with flexion and extension.  She was [facility] for the symptoms on 4/29 and had extensive imaging that was negative.  LP was attempted but was not successful when she went home.  She states that since then, she had intermittent fevers as well as ongoing headache.  She presents today for further evaluation.  Denies any focal numbness or weakness.  No seizures.  No confusion.  No known sick contacts.  No recent immunizations.  No recent travel.  No other complaints.  No tick bites.  Past Medical History:  Diagnosis Date  . Anemia   . Asthma    no hospitalizations, no recent attacks (since childhood)  . Cervical dysplasia   . Complication of anesthesia    one sided epidural  . History of gestational hypertension   . Hypertension   . Ovarian cyst     Patient Active Problem List   Diagnosis Date Noted  . Encounter for IUD insertion 02/17/2020  . Family history of breast cancer 10/22/2019  . 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 BMI 50.0-59.9, adult (HCC) 06/03/2018  . History of herpes genitalis 06/03/2018    Past Surgical History:  Procedure Laterality Date  . CESAREAN SECTION N/A 06/28/2018   Procedure: CESAREAN SECTION;  Surgeon: Levie HeritageStinson, Jacob J, DO;  [facility];  Service: Obstetrics;  Laterality: N/A;  . THERAPEUTIC ABORTION       OB History    Gravida  4   Para  2   Term  2   Preterm      AB  2   Living  2     SAB      TAB  1   Ectopic      Multiple  0   Live Births  2           Family History  Problem Relation Age of Onset  . Hypertension Mother   . Hypertension Father   . Diabetes Paternal Grandfather     Social History   Tobacco Use  . Smoking status: Never Smoker  . Smokeless tobacco: Never Used  Substance Use Topics  . Alcohol use: No  . Drug use: No    Home Medications Prior to Admission medications   Medication Sig Start Date End Date Taking? Authorizing Provider  albuterol (VENTOLIN HFA) 108 (90 Base) MCG/ACT inhaler Inhale 2 puffs into the lungs every 4 (four) hours as needed. 11/30/19   [provider]  cephALEXin (KEFLEX) 500 MG capsule Take 1 capsule (500 mg total) by mouth 3 (three) times daily for 7 days. 03/19/20 03/26/20  Shaune PollackIsaacs, Aviana Shevlin, MD  cetirizine (ZYRTEC) 10 MG tablet Take 10 mg by mouth daily.    [provider]  fluticasone (FLONASE) 50 MCG/ACT nasal spray Place 1 spray into both nostrils daily.    [provider]  HYDROcodone-acetaminophen (NORCO/VICODIN) 5-325 MG tablet Take 1-2 tablets by mouth  every 6 (six) hours as needed for moderate pain or severe pain. 03/19/20 03/19/21  Shaune Pollack, MD  Multiple Vitamin (MULTIVITAMIN WITH MINERALS) TABS tablet Take 1 tablet by mouth daily.    [provider]  Multiple Vitamins-Minerals (HAIR SKIN AND NAILS FORMULA PO) Take 1 tablet by mouth daily.    [provider]  ondansetron (ZOFRAN ODT) 4 MG disintegrating tablet Take 1 tablet (4 mg total) by mouth every 8 (eight) hours as needed for nausea or vomiting. 03/19/20   Shaune Pollack, MD  valACYclovir (VALTREX) 1000 MG tablet Take 1 tablet (1,000 mg total) by mouth 3 (three) times daily for 10 days. 03/19/20 03/29/20  Shaune Pollack, MD    Allergies    Penicillins  Review of Systems   Review of Systems  Constitutional: Positive for chills, fatigue and fever.  HENT: Negative for congestion and sore  throat.   Eyes: Negative for visual disturbance.  Respiratory: Negative for cough and shortness of breath.   Cardiovascular: Negative for chest pain.  Gastrointestinal: Negative for abdominal pain, diarrhea, nausea, rectal pain and vomiting.  Genitourinary: Negative for flank pain.  Musculoskeletal: Positive for neck pain and neck stiffness. Negative for back pain.  Skin: Negative for rash and wound.  Neurological: Positive for headaches. Negative for weakness.  All other systems reviewed and are negative.   Physical Exam Updated Vital Signs BP (!) 151/85   Pulse 82   Temp 98.5 F (36.9 C) (Oral)   Resp 16   Ht 5\' 7"  (1.702 m)   Wt 106.6 kg   LMP 03/16/2020   SpO2 100%   BMI 36.81 kg/m   Physical Exam Vitals and nursing note reviewed.  Constitutional:      General: She is not in acute distress.    Appearance: She is well-developed.  HENT:     Head: Normocephalic and atraumatic.  Eyes:     Conjunctiva/sclera: Conjunctivae normal.  Neck:     Comments: Minimal neck tenderness noted with flexion, rotation Cardiovascular:     Rate and Rhythm: Normal rate and regular rhythm.     Heart sounds: Normal heart sounds. No murmur. No friction rub.  Pulmonary:     Effort: Pulmonary effort is normal. No respiratory distress.     Breath sounds: Normal breath sounds. No wheezing or rales.  Abdominal:     General: There is no distension.     Palpations: Abdomen is soft.     Tenderness: There is no abdominal tenderness.  Skin:    General: Skin is warm.     Capillary Refill: Capillary refill takes less than 2 seconds.  Neurological:     Mental Status: She is alert and oriented to person, place, and time.     GCS: GCS eye subscore is 4. GCS verbal subscore is 5. GCS motor subscore is 6.     Motor: No abnormal muscle tone.     Comments: Strength 5/5 bilateral UE and LE. Normal sensation to light touch. AOx4.      ED Results / Procedures / Treatments   Labs (all labs ordered are  listed, but only abnormal results are displayed) Labs Reviewed  CBC WITH DIFFERENTIAL/PLATELET - Abnormal; Notable for the following components:      Result Value   Hemoglobin 9.6 (*)    HCT 31.2 (*)    MCV 77.2 (*)    MCH 23.8 (*)    RDW 16.3 (*)    All other components within normal limits  COMPREHENSIVE METABOLIC PANEL -  Abnormal; Notable for the following components:   Potassium 3.1 (*)    Calcium 8.8 (*)    Alkaline Phosphatase 34 (*)    All other components within normal limits  URINALYSIS, COMPLETE (UACMP) WITH MICROSCOPIC - Abnormal; Notable for the following components:   Color, Urine AMBER (*)    APPearance CLOUDY (*)    Specific Gravity, Urine 1.034 (*)    Hgb urine dipstick LARGE (*)    Protein, ur 100 (*)    Leukocytes,Ua TRACE (*)    RBC / HPF >50 (*)    All other components within normal limits  PROTEIN, CSF - Abnormal; Notable for the following components:   Total  Protein, CSF 10 (*)    All other components within normal limits  CSF CELL COUNT WITH DIFFERENTIAL - Abnormal; Notable for the following components:   Color, CSF CLEAR (*)    Appearance, CSF COLORLESS (*)    WBC, CSF 7 (*)    All other components within normal limits  CSF CULTURE  PROTIME-INR  LACTIC ACID, PLASMA  PREGNANCY, URINE  GLUCOSE, CSF  LACTIC ACID, PLASMA  HERPES SIMPLEX VIRUS(HSV) DNA BY PCR    EKG None  Radiology CT Angio Head W or Wo Contrast  Result Date: 03/17/2020 CLINICAL DATA:  Subarachnoid hemorrhage suspected. EXAM: CT ANGIOGRAPHY HEAD AND NECK TECHNIQUE: Multidetector CT imaging of the head and neck was performed using the standard protocol during bolus administration of intravenous contrast. Multiplanar CT image reconstructions and MIPs were obtained to evaluate the vascular anatomy. Carotid stenosis measurements (when applicable) are obtained utilizing NASCET criteria, using the distal internal carotid diameter as the denominator. CONTRAST:  63mL OMNIPAQUE IOHEXOL 350  MG/ML SOLN COMPARISON:  None. FINDINGS: CT HEAD FINDINGS Brain: No evidence of acute infarction, hemorrhage, hydrocephalus, extra-axial collection or mass lesion/mass effect. Vascular: Negative for hyperdense vessel Skull: Negative Sinuses: Negative Orbits: Negative Review of the MIP images confirms the above findings CTA NECK FINDINGS Aortic arch: Standard branching. Imaged portion shows no evidence of aneurysm or dissection. No significant stenosis of the major arch vessel origins. Right carotid system: Normal right carotid without stenosis or dissection Left carotid system: Normal left carotid without stenosis or dissection Vertebral arteries: Both vertebral arteries widely patent without stenosis Skeleton: Negative Other neck: Negative Upper chest: Lung apices clear bilaterally. Review of the MIP images confirms the above findings CTA HEAD FINDINGS Anterior circulation: Cavernous carotid widely patent bilaterally without stenosis or aneurysm. Anterior and middle cerebral arteries patent bilaterally without stenosis or aneurysm. Posterior circulation: Both vertebral arteries patent to the basilar. PICA patent bilaterally. Basilar widely patent. AICA, superior cerebellar, and posterior cerebral arteries are patent bilaterally. Negative for aneurysm. 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 No filling defect. No evidence of venous sinus thrombosis. Anatomic variants: None Review of the MIP images confirms the above findings IMPRESSION: 1. Negative CT head.  Negative for subarachnoid hemorrhage 2. Negative CTA head 3. Negative CTA neck Electronically Signed   By: Franchot Gallo M.D.   On: 03/17/2020 20:54   CT Angio Neck W and/or Wo Contrast  Result Date: 03/17/2020 CLINICAL DATA:  Subarachnoid hemorrhage suspected. EXAM: CT ANGIOGRAPHY HEAD AND NECK TECHNIQUE: Multidetector CT imaging of the head and neck was performed using the standard protocol during bolus  administration of intravenous contrast. Multiplanar CT image reconstructions and MIPs were obtained to evaluate the vascular anatomy. Carotid stenosis measurements (when applicable) are obtained utilizing NASCET criteria, using the distal internal carotid diameter as  the denominator. CONTRAST:  16mL OMNIPAQUE IOHEXOL 350 MG/ML SOLN COMPARISON:  None. FINDINGS: CT HEAD FINDINGS Brain: No evidence of acute infarction, hemorrhage, hydrocephalus, extra-axial collection or mass lesion/mass effect. Vascular: Negative for hyperdense vessel Skull: Negative Sinuses: Negative Orbits: Negative Review of the MIP images confirms the above findings CTA NECK FINDINGS Aortic arch: Standard branching. Imaged portion shows no evidence of aneurysm or dissection. No significant stenosis of the major arch vessel origins. Right carotid system: Normal right carotid without stenosis or dissection Left carotid system: Normal left carotid without stenosis or dissection Vertebral arteries: Both vertebral arteries widely patent without stenosis Skeleton: Negative Other neck: Negative Upper chest: Lung apices clear bilaterally. Review of the MIP images confirms the above findings CTA HEAD FINDINGS Anterior circulation: Cavernous carotid widely patent bilaterally without stenosis or aneurysm. Anterior and middle cerebral arteries patent bilaterally without stenosis or aneurysm. Posterior circulation: Both vertebral arteries patent to the basilar. PICA patent bilaterally. Basilar widely patent. AICA, superior cerebellar, and posterior cerebral arteries are patent bilaterally. Negative for aneurysm. 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 No filling defect. No evidence of venous sinus thrombosis. Anatomic variants: None Review of the MIP images confirms the above findings IMPRESSION: 1. Negative CT head.  Negative for subarachnoid hemorrhage 2. Negative CTA head 3. Negative CTA neck Electronically Signed    By: Marlan Palau M.D.   On: 03/17/2020 20:54   CT VENOGRAM HEAD  Result Date: 03/17/2020 CLINICAL DATA:  Suspect subarachnoid hemorrhage. EXAM: CT VENOGRAM HEAD TECHNIQUE: Following intravenous contrast injection, delayed venous phase scanning of the head was performed. CONTRAST:  57mL OMNIPAQUE IOHEXOL 350 MG/ML SOLN COMPARISON:  CT head earlier today FINDINGS: Hypoplastic right transverse and sigmoid sinus. No filling defect. Left transverse sinus and sigmoid sinus patent but relatively small . No filling defect. Superior sagittal sinus widely patent. Straight sinus patent. IMPRESSION: Negative for dural sinus thrombosis Hypoplastic right transverse sinus. Relatively small left transverse sinus. This may be a congenital variation. Transverse sinus stenosis associated with intracranial hypertension headache is usually more focal in currently seen on the study however correlate with headaches and papilledema. Electronically Signed   By: Marlan Palau M.D.   On: 03/17/2020 21:01   DG Lumbar Puncture Fluoro Guide  Result Date: 03/19/2020 CLINICAL DATA:  Neck pain, headache EXAM: DIAGNOSTIC LUMBAR PUNCTURE UNDER FLUOROSCOPIC GUIDANCE FLUOROSCOPY TIME:  Fluoroscopy Time:  00:42 Number of Acquired Spot Images: 1 PROCEDURE: Informed consent was obtained from the patient prior to the procedure, including potential complications of headache, allergy, and pain. With the patient prone, the lower back was prepped with Betadine. 1% Lidocaine was used for local anesthesia. Lumbar puncture was performed at the L4-L5 level using a 5 inch, 22 gauge needle with return of clear CSF. 6 ml of CSF were obtained for laboratory studies. The patient tolerated the procedure well and there were no apparent complications. IMPRESSION: Successful fluoroscopic guided lumbar puncture. 6 mL clear CSF was submitted to the laboratory for analysis. Electronically Signed   By: Lauralyn Primes M.D.   On: 03/19/2020 13:20     Procedures Procedures (including critical care time)  Medications Ordered in ED Medications  sodium chloride 0.9 % bolus 1,000 mL (0 mLs Intravenous Stopped 03/19/20 1115)  prochlorperazine (COMPAZINE) injection 10 mg (10 mg Intravenous Given 03/19/20 0935)  diphenhydrAMINE (BENADRYL) injection 25 mg (25 mg Intravenous Given 03/19/20 0935)  valACYclovir (VALTREX) tablet 1,000 mg (1,000 mg Oral Given 03/19/20 1450)  ketorolac (TORADOL) 30 MG/ML injection  15 mg (15 mg Intravenous Given 03/19/20 1451)    ED Course  I have reviewed the triage vital signs and the nursing notes.  Pertinent labs & imaging results that were available during my care of the patient were reviewed by me and considered in my medical decision making (see chart for details).  Clinical Course as of Mar 19 2017  Sat Mar 19, 2020  1847 24 year old female here with ongoing headache and neck pain.  Patient just underwent attempted LP at [facility], which was not successful.  She appears clinically well here, differential includes viral meningitis, viral encephalitis, tension headache, migraine.  CT angio and venogram were negative and reviewed by myself.  Given that she was a difficult stick, will consult radiology for LP.  Do feel this is pertinent in the setting of her ongoing reported fevers and neck stiffness.  She feels improved with Compazine and Benadryl.   [CI]    Clinical Course User Index [CI] Shaune Pollack, MD   MDM Rules/Calculators/A&P                      24 yo F with PMHx above here with headache, low-grade fevers, and neck pain. Pt has been seen three times in ED for similar symptoms, with LP attempted on last visit. Reviewed labs, records, and imaging. Recent CT Angio and CTV neg as above. After discussing options, LP performed as above. Pt tolerated well. CSF studies show 7 WBC, 0 RBCs. No bacteria. Culture pending. Low protein, normal glucose. Discussed with Dr. 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 GIven the pt's well appearance and duration of sx, he suggests pt can be managed as an outpatient. Will start her on Valtrex and have her f/u in clinic. Of note, UA also + for WBCs and pt has had some urinary frequency - do not suspect this is contributing but in setting of possible concomitant viral CSF inflammation, will cover broadly. Encouraged fluids and good return precautions. Patient alert, awake, with no signs of encephalitis or sepsis.   Final Clinical Impression(s) / ED Diagnoses Final diagnoses:  Viral meningitis  Viral syndrome    Rx / DC Orders ED Discharge Orders         Ordered    valACYclovir (VALTREX) 1000 MG tablet  3 times daily     03/19/20 1540    ondansetron (ZOFRAN ODT) 4 MG disintegrating tablet  Every 8 hours PRN     03/19/20 1540    HYDROcodone-acetaminophen (NORCO/VICODIN) 5-325 MG tablet  Every 6 hours PRN     03/19/20 1540    cephALEXin (KEFLEX) 500 MG capsule  3 times daily     03/19/20 1540           Shaune Pollack, MD 03/19/20 2018

## 2020-03-19 NOTE — ED Triage Notes (Signed)
Pt states neck stiffness and HA. States was seen at [facility] and had CT and attempted LP. Symptoms since Tuesday. Fever last night of 101.2

## 2020-03-23 LAB — CSF CULTURE W GRAM STAIN
Culture: NO GROWTH
Gram Stain: NONE SEEN

## 2020-03-24 IMAGING — US US MFM FETAL BPP W/O NON-STRESS
1 series · 13 of 28 positions shown · non-contrast
Comparison: none

[Series 1: us mfm fetal bpp w/o non-stress · 29 acquisitions, 13 frames shown]
[im 2/29]
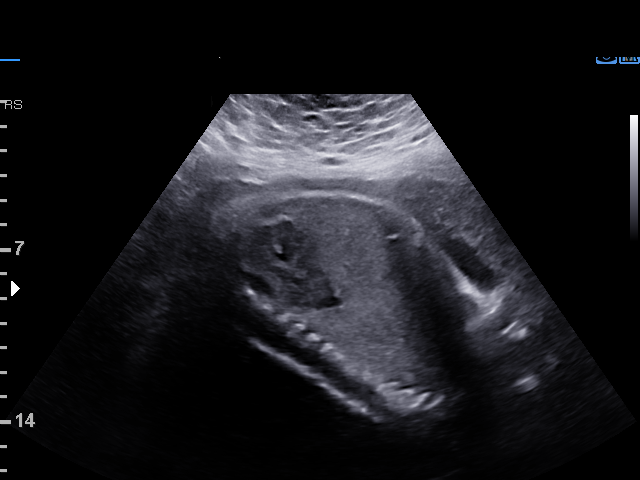
[im 4/29]
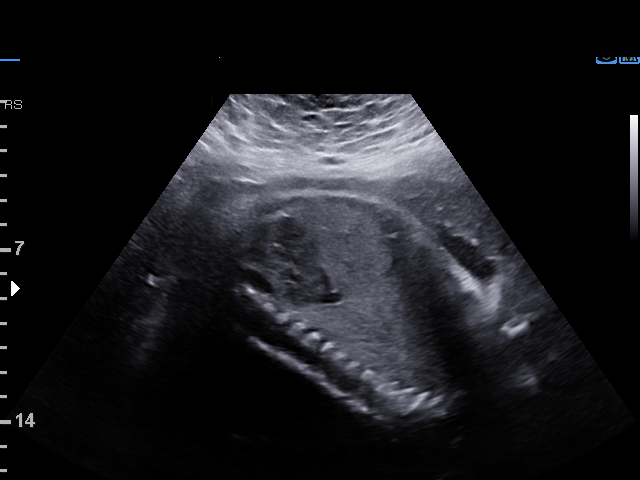
[im 6/29]
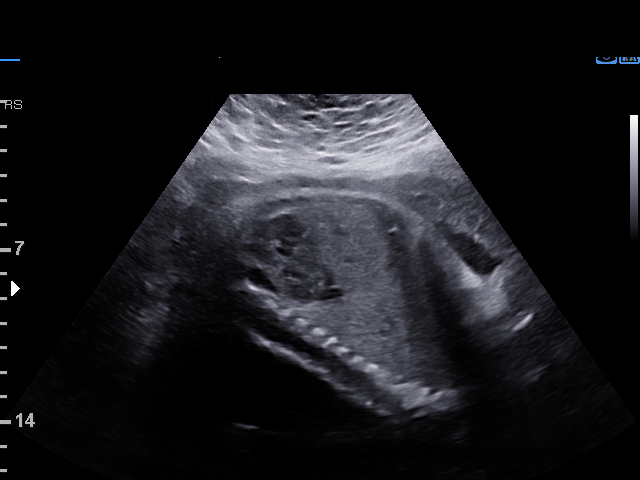
[im 8/29]
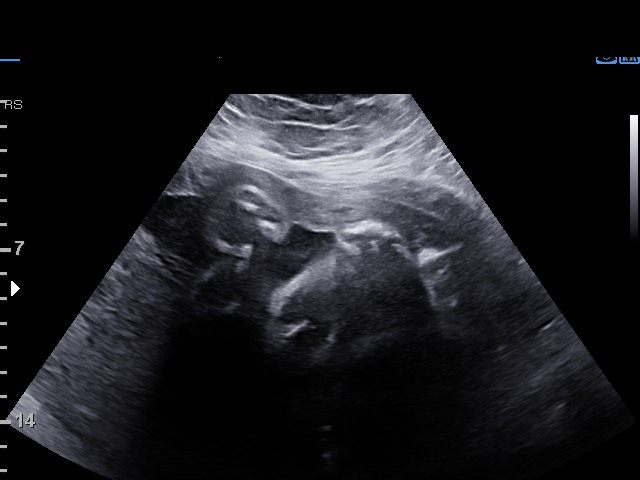
[im 10/29]
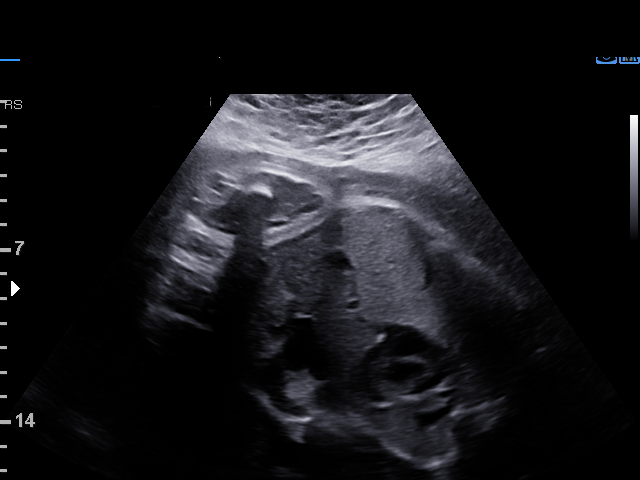
[im 12/29]
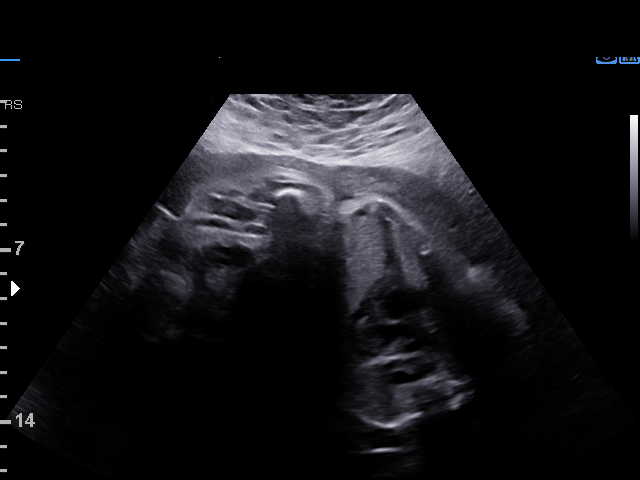
[im 15/29]
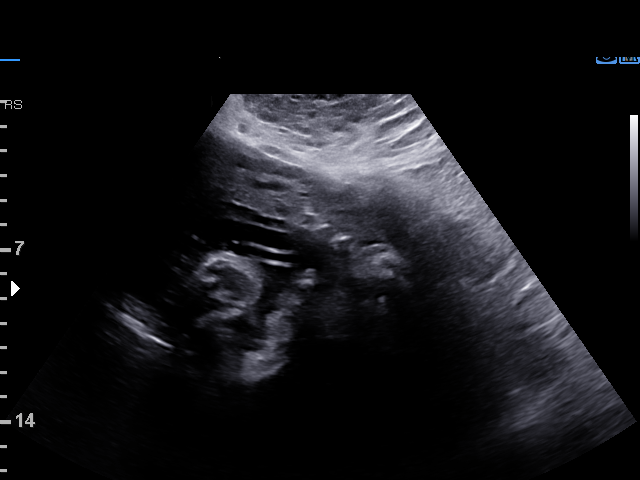
[im 17/29]
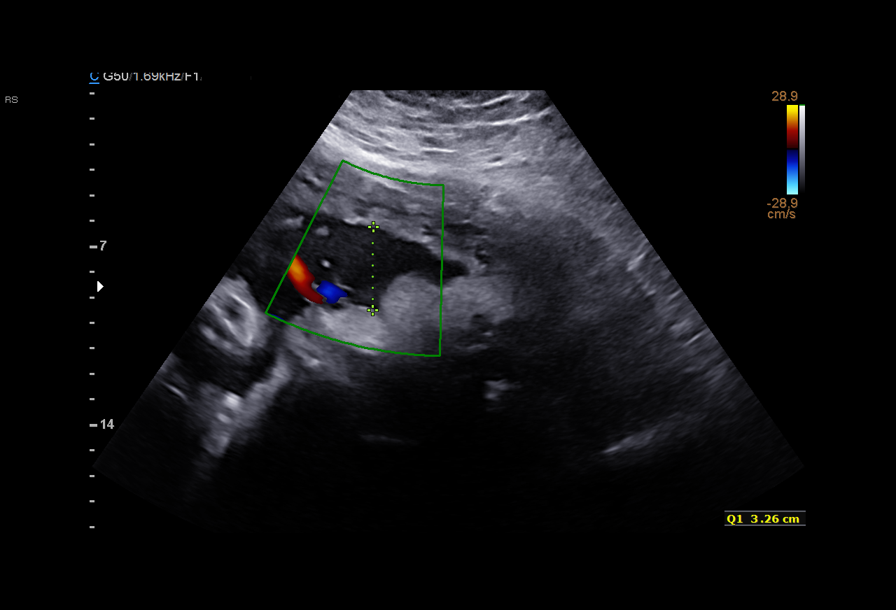
[im 19/29]
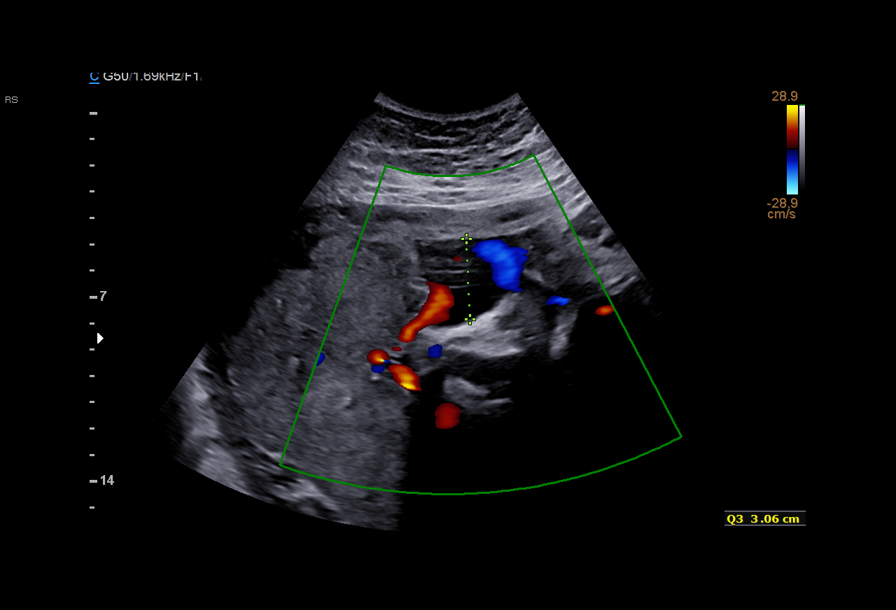
[im 21/29]
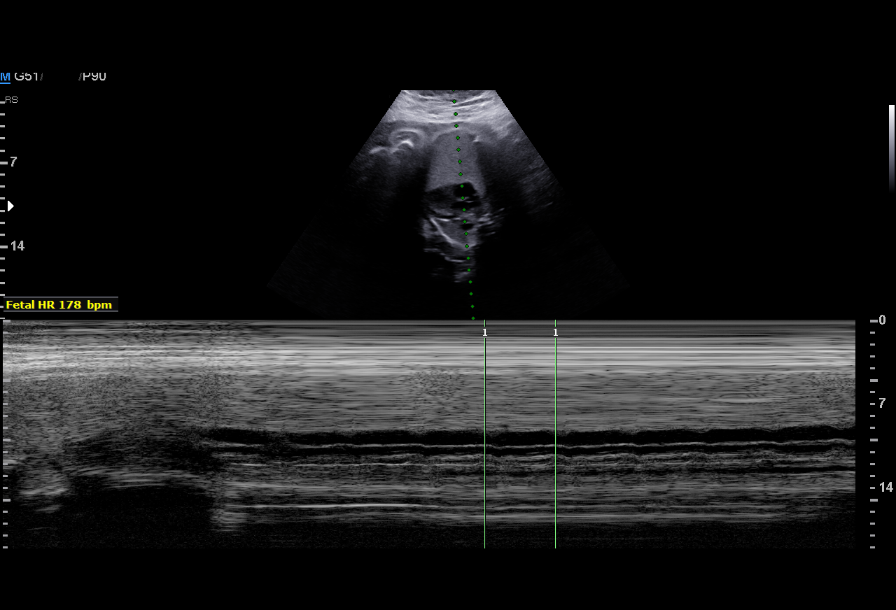
[im 23/29]
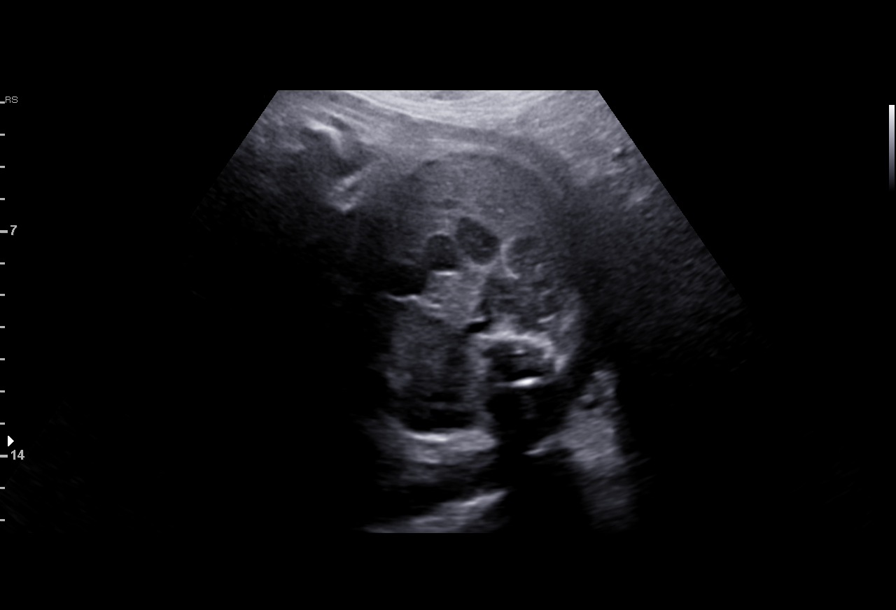
[im 25/29]
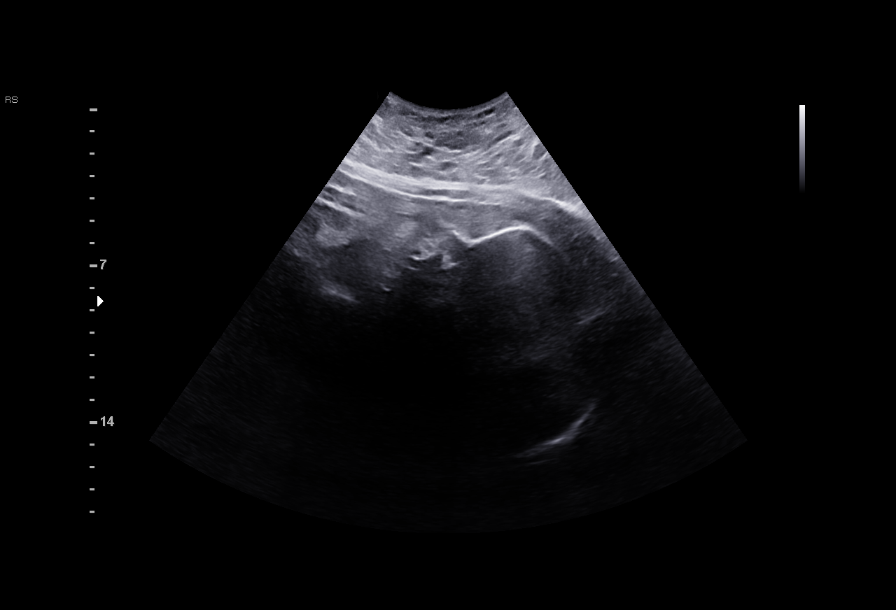
[im 27/29]
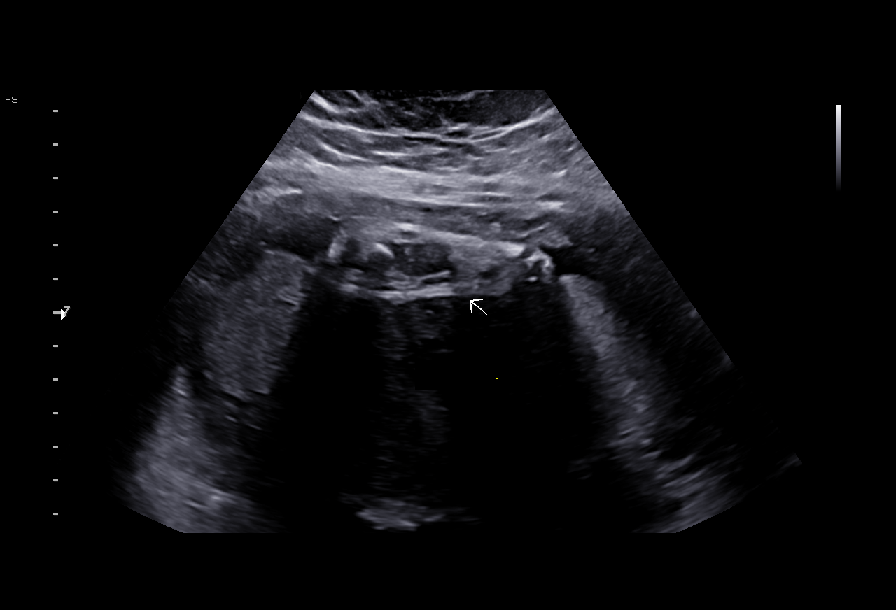

[13 of 28 positions shown; findings below may reference images not displayed]

1  AWA TIGER                [id number]      [id number]     [id number]
Indications

35 weeks gestation of pregnancy
Non-reactive NST
Gestational diabetes in pregnancy,
unspecified control
OB History

Gravidity:    3         Term:   1
TOP:          1        Living:  1
Fetal Evaluation

Num Of Fetuses:     1
Fetal Heart         146
Rate(bpm):
Cardiac Activity:   Observed
Presentation:       Cephalic

Amniotic Fluid
AFI FV:      Subjectively within normal limits

AFI Sum(cm)     %Tile       Largest Pocket(cm)
14.53           52

RUQ(cm)       RLQ(cm)       LUQ(cm)        LLQ(cm)
3.26
Biophysical Evaluation

Amniotic F.V:   Pocket => 2 cm two         F. Tone:        Observed
planes
F. Movement:    Observed                   Score:          [DATE]
F. Breathing:   Observed
Gestational Age

LMP:           35w 0d        Date:  09/27/17                 EDD:   07/04/18
Best:          35w 0d     Det. By:  LMP  (09/27/17)          EDD:   07/04/18
Anatomy

Thoracic:              Appears normal         Kidneys:                Appear normal
Stomach:               Appears normal, left   Bladder:                Appears normal
sided

Other:  Female gender.
Impression

Amniotic fluid is normal and good fetal activity is seen.
BPP [DATE].
Recommendations

Continue weekly antenatal testing till delivery.

## 2020-04-07 IMAGING — US US MFM OB DETAIL+14 WK
1 series · 12 of 28 positions shown · non-contrast
Comparison: none

[Series 1: us mfm ob detail+14 wk · 12 of 55 slices shown]
[im 3/55]
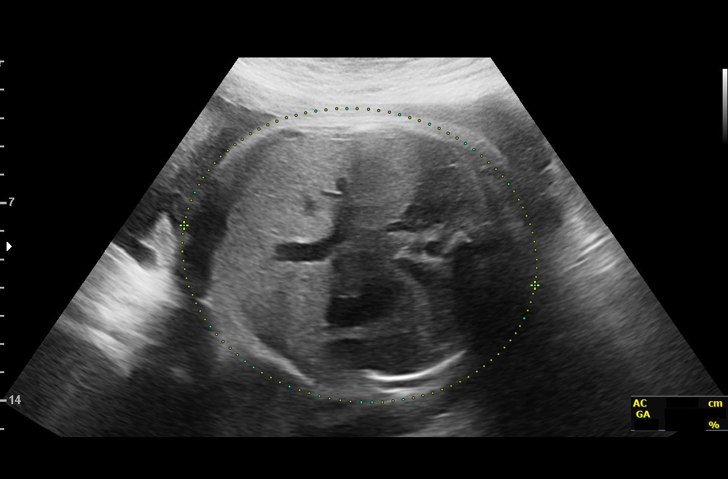
[im 7/55]
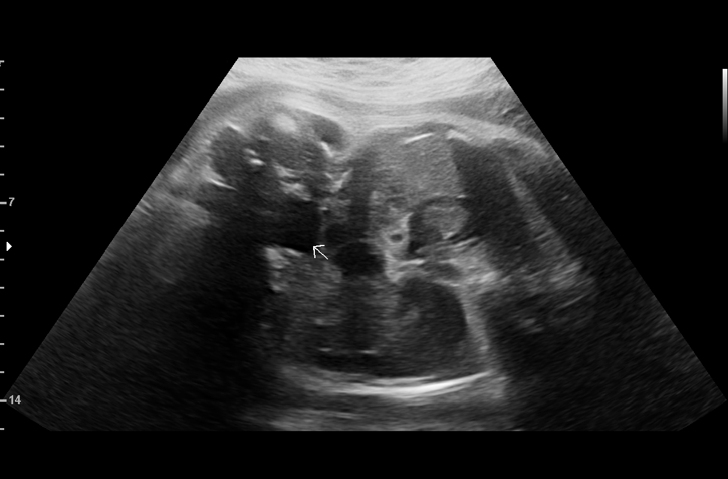
[im 11/55]
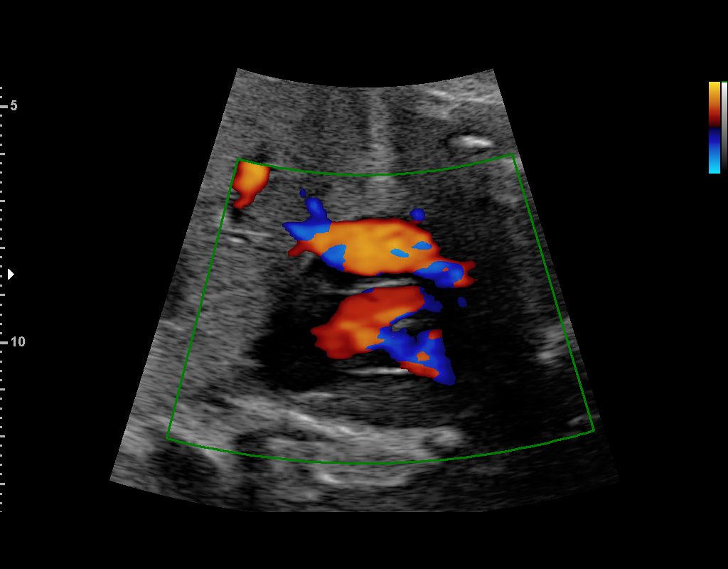
[im 17/55]
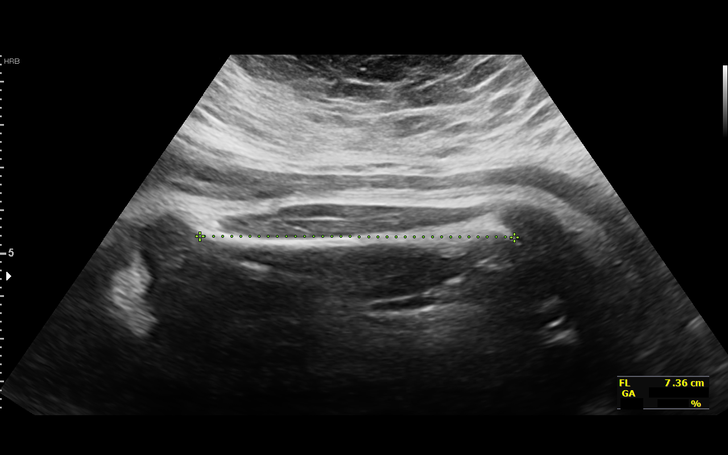
[im 21/55]
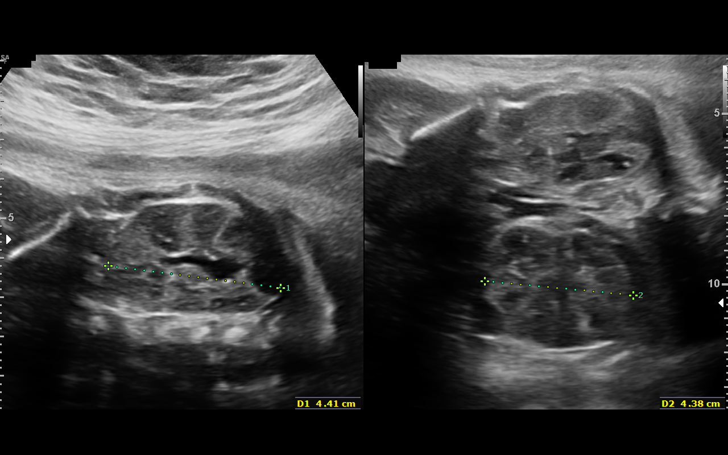
[im 25/55]
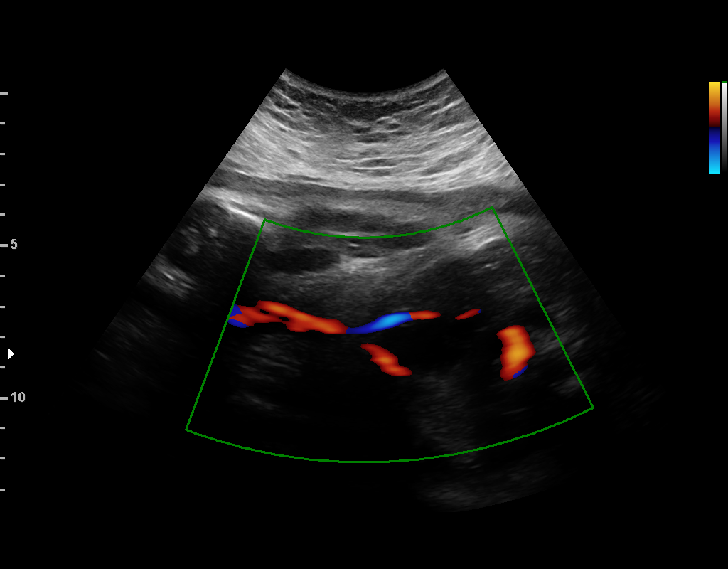
[im 31/55]
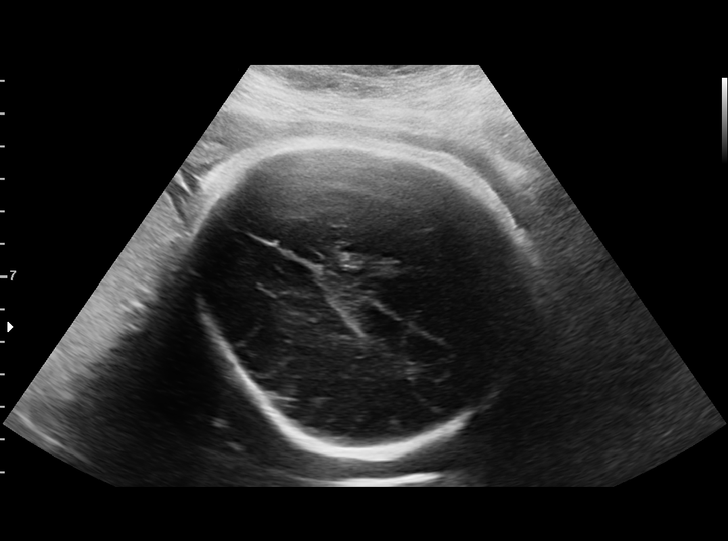
[im 35/55]
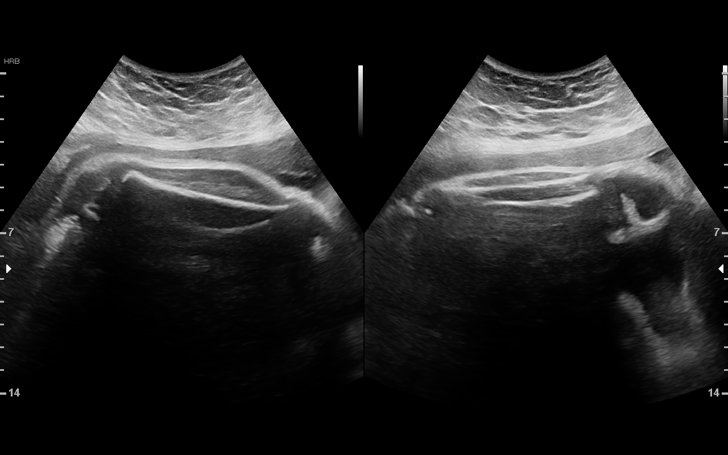
[im 39/55]
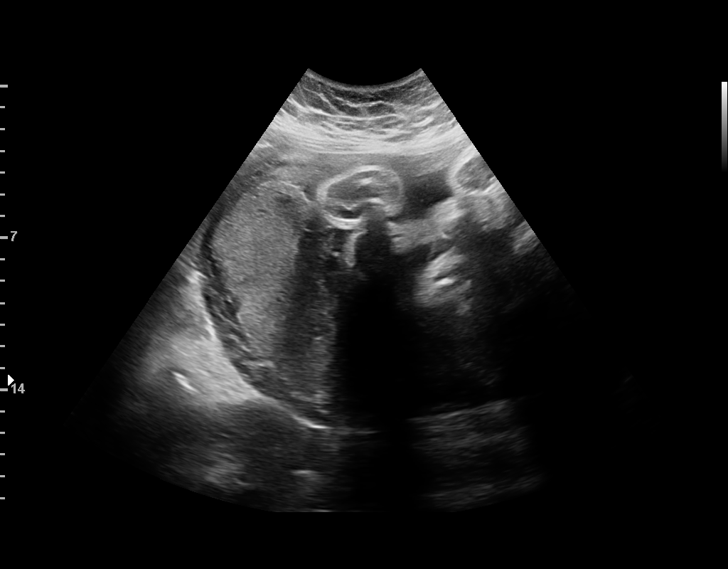
[im 45/55]
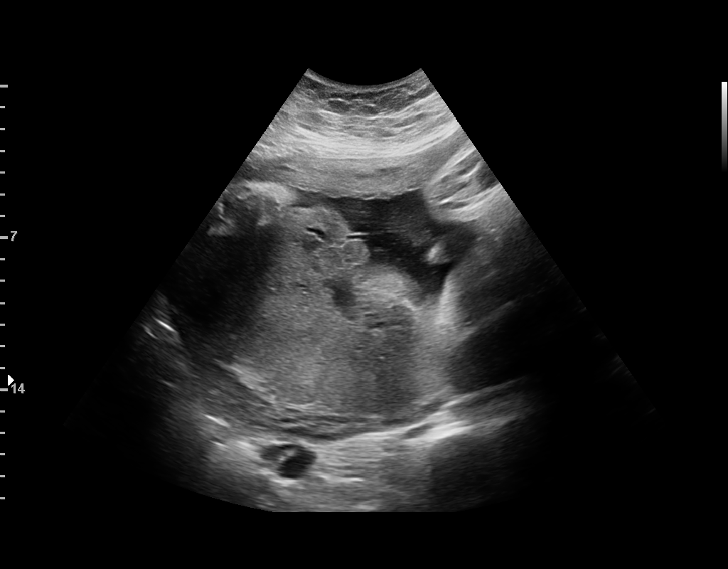
[im 49/55]
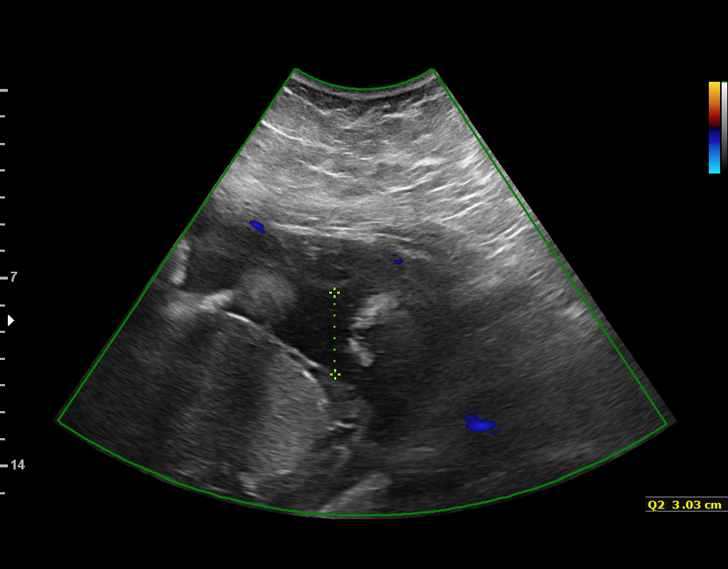
[im 53/55]
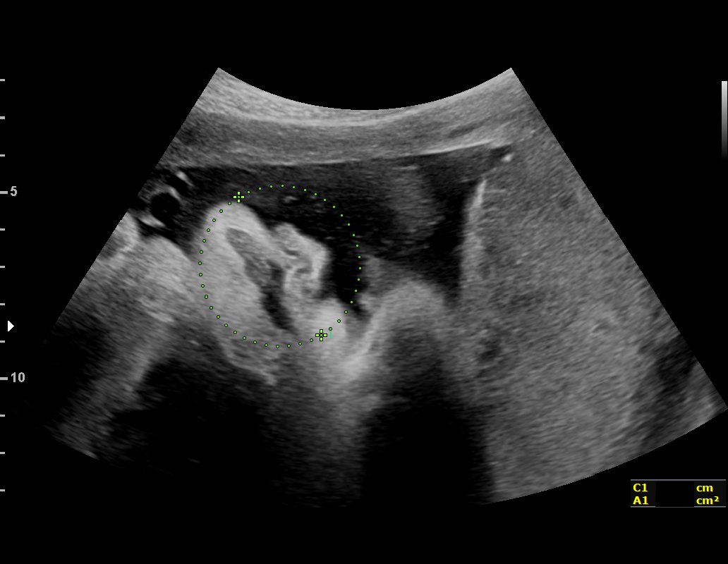

[12 of 28 positions shown; findings below may reference images not displayed]

1  NELLA MARULANDA                [id number]      [id number]     [id number]
2  JESUS GUSTAVO LAOS          [id number]      [id number]     [id number]
Indications

37 weeks gestation of pregnancy
Hypertension - Chronic/Pre-existing
Maternal morbid obesity
Poor obstetrical history (shoulder dystocia )
Gestational diabetes in pregnancy,
unspecified control
Herpes simplex virus ([place]AMBUCA[place])
OB History

Gravidity:    3         Term:   1
TOP:          1        Living:  1
Fetal Evaluation

Num Of Fetuses:     1
Fetal Heart         148
Rate(bpm):
Cardiac Activity:   Observed
Presentation:       Cephalic
Placenta:           Anterior
P. Cord Insertion:  Visualized, central

Amniotic Fluid
AFI FV:      Subjectively within normal limits

AFI Sum(cm)     %Tile       Largest Pocket(cm)
11.3            34

RUQ(cm)       RLQ(cm)       LUQ(cm)        LLQ(cm)
2.58          0
Biophysical Evaluation

Amniotic F.V:   Within normal limits       F. Tone:        Observed
F. Movement:    Observed                   Score:          [DATE]
F. Breathing:   Observed
Biometry

BPD:      91.8  mm     G. Age:  37w 2d         73  %    CI:        77.21   %    70 - 86
FL/HC:      22.3   %    20.8 -
HC:      330.8  mm     G. Age:  37w 5d         41  %    HC/AC:      0.92        0.92 -
AC:      360.4  mm     G. Age:  40w 0d       > 97  %    FL/BPD:     80.3   %    71 - 87
FL:       73.7  mm     G. Age:  37w 5d         67  %    FL/AC:      20.4   %    20 - 24
HUM:      65.7  mm     G. Age:  38w 1d         92  %

Est. FW:    1567  gm           8 lb   > 90  %
Gestational Age

LMP:           37w 0d        Date:  09/27/17                 EDD:   07/04/18
U/S Today:     38w 1d                                        EDD:   06/26/18
Best:          37w 0d     Det. By:  LMP  (09/27/17)          EDD:   07/04/18
Anatomy

Cranium:               Appears normal         Aortic Arch:            Not well visualized
Cavum:                 Appears normal         Ductal Arch:            Not well visualized
Ventricles:            Appears normal         Diaphragm:              Appears normal
Choroid Plexus:        Appears normal         Stomach:                Appears normal, left
sided
Cerebellum:            Appears normal         Abdomen:                Appears normal
Posterior Fossa:       Not well visualized    Abdominal Wall:         Appears nml (cord
insert, abd wall)
Nuchal Fold:           Not applicable (>20    Cord Vessels:           Appears normal (3
wks GA)                                        vessel cord)
Face:                  Profile nl; orbits not Kidneys:                Appear normal
well visualized
Lips:                  Appears normal         Bladder:                Appears normal
Thoracic:              Appears normal         Spine:                  Not well visualized
Heart:                 Not well visualized    Upper Extremities:      Not well visualized
RVOT:                  Appears normal         Lower Extremities:      Visualized
LVOT:                  Appears normal

Other:  Male gender. Technicallly difficult due to advanced GA and maternal
habitus.
Cervix Uterus Adnexa

Cervix
Not visualized (advanced GA >93wks)
Impression

On ultrasound, the fetal growth is appropriate for the
gestational age. Amniotic fluid is normal and good fetal
activity is seen. Fetal anatomy appears normal, but limited
because of advanced gestational age.
Chronic hypertension. Well-controlled without
antihypertensives.
Recommendations

Continue weekly antenatal testing till delivery.

## 2020-04-11 ENCOUNTER — Other Ambulatory Visit (HOSPITAL_COMMUNITY)
Admission: RE | Admit: 2020-04-11 | Discharge: 2020-04-11 | Disposition: A | Discharge status: Home / Self Care | Payer: Medicaid Other | Source: Ambulatory Visit | Attending: Obstetrics and Gynecology | Admitting: Obstetrics and Gynecology

## 2020-04-11 ENCOUNTER — Other Ambulatory Visit: Payer: Self-pay

## 2020-04-11 ENCOUNTER — Ambulatory Visit (INDEPENDENT_AMBULATORY_CARE_PROVIDER_SITE_OTHER): Payer: Medicaid Other | Admitting: *Deleted

## 2020-04-11 VITALS — BP 143/86 | HR 77

## 2020-04-11 DIAGNOSIS — N898 Other specified noninflammatory disorders of vagina: Secondary | ICD-10-CM | POA: Diagnosis not present

## 2020-04-11 LAB — POCT URINALYSIS DIPSTICK

## 2020-04-11 MED ORDER — PHENAZOPYRIDINE HCL 200 MG PO TABS: 200.0000 mg | ORAL_TABLET | Freq: Three times a day (TID) | ORAL | 1 refills | Status: DC | PRN

## 2020-04-11 MED ORDER — NITROFURANTOIN MONOHYD MACRO 100 MG PO CAPS: 100.0000 mg | ORAL_CAPSULE | Freq: Two times a day (BID) | ORAL | 0 refills | Status: AC

## 2020-04-11 NOTE — Progress Notes (Signed)
SUBJECTIVE:  24 y.o. female complains of vaginal irritation after completing an antibiotic.  Denies abnormal vaginal bleeding or significant pelvic pain or fever. No UTI symptoms. Denies history of known exposure to STD.  Pt also desires to have her urine checked for UTI. Denies any current symptoms but does get them frequently.   Patient's last menstrual period was 03/16/2020.  OBJECTIVE:  She appears well, afebrile. Urine dipstick: positive for RBC's and positive for leukocytes.  ASSESSMENT:  Vaginal Irritation     PLAN:  GC, chlamydia, trichomonas, BVAG, CVAG probe sent to lab. Treatment: To be determined once lab results are received ROV prn if symptoms persist or worsen.

## 2020-04-13 LAB — CERVICOVAGINAL ANCILLARY ONLY
Bacterial Vaginitis (gardnerella): POSITIVE — AB
Candida Glabrata: NEGATIVE
Candida Vaginitis: NEGATIVE
Chlamydia: NEGATIVE
Comment: NEGATIVE
Comment: NEGATIVE
Comment: NEGATIVE
Comment: NEGATIVE
Comment: NEGATIVE
Comment: NORMAL
Neisseria Gonorrhea: NEGATIVE
Trichomonas: POSITIVE — AB

## 2020-04-13 LAB — URINE CULTURE

## 2020-04-15 ENCOUNTER — Telehealth: Payer: Self-pay | Admitting: *Deleted

## 2020-04-15 MED ORDER — METRONIDAZOLE 500 MG PO TABS: 500.0000 mg | ORAL_TABLET | Freq: Two times a day (BID) | ORAL | 0 refills | Status: DC

## 2020-04-15 MED ORDER — FLUCONAZOLE 150 MG PO TABS: 150.0000 mg | ORAL_TABLET | Freq: Once | ORAL | 3 refills | Status: AC

## 2020-04-15 NOTE — Telephone Encounter (Signed)
Pt called to discuss her results. Discussed treatment and needing to inform partner and that they will need to get tested and treated as well. Also to refrain from any unprotected sex for 7-10 after both have been treated and pt will come in for TOC in 4 weeks.

## 2020-04-17 NOTE — Progress Notes (Signed)
Patient was assessed and managed by nursing staff during this encounter. I have reviewed the chart and agree with the documentation and plan. I have also made any necessary editorial changes.  Vera Bing, MD 04/17/2020 8:00 PM

## 2020-04-20 ENCOUNTER — Encounter: Payer: Self-pay | Admitting: Obstetrics and Gynecology

## 2020-04-20 DIAGNOSIS — A5901 Trichomonal vulvovaginitis: Secondary | ICD-10-CM | POA: Insufficient documentation

## 2020-04-20 HISTORY — DX: Trichomonal vulvovaginitis: A59.01

## 2020-04-22 ENCOUNTER — Emergency Department: Payer: Medicaid Other

## 2020-04-22 ENCOUNTER — Other Ambulatory Visit: Payer: Self-pay

## 2020-04-22 ENCOUNTER — Emergency Department
Admission: EM | Admit: 2020-04-22 | Discharge: 2020-04-22 | Disposition: A | Discharge status: Home / Self Care | Payer: Medicaid Other | Attending: Emergency Medicine | Admitting: Emergency Medicine

## 2020-04-22 DIAGNOSIS — J45909 Unspecified asthma, uncomplicated: Secondary | ICD-10-CM | POA: Insufficient documentation

## 2020-04-22 DIAGNOSIS — U071 COVID-19: Secondary | ICD-10-CM | POA: Diagnosis not present

## 2020-04-22 DIAGNOSIS — I10 Essential (primary) hypertension: Secondary | ICD-10-CM | POA: Insufficient documentation

## 2020-04-22 DIAGNOSIS — R439 Unspecified disturbances of smell and taste: Secondary | ICD-10-CM | POA: Diagnosis present

## 2020-04-22 LAB — SARS CORONAVIRUS 2 BY RT PCR (HOSPITAL ORDER, PERFORMED IN ~~LOC~~ HOSPITAL LAB): SARS Coronavirus 2: POSITIVE — AB

## 2020-04-22 MED ORDER — AZITHROMYCIN 250 MG PO TABS
ORAL_TABLET | ORAL | 0 refills | Status: DC
Start: 2020-04-22 — End: 2020-07-15

## 2020-04-22 MED ORDER — ALBUTEROL SULFATE HFA 108 (90 BASE) MCG/ACT IN AERS: 2.0000 | INHALATION_SPRAY | RESPIRATORY_TRACT | 3 refills | Status: DC | PRN

## 2020-04-22 MED ORDER — PREDNISONE 10 MG (21) PO TBPK
ORAL_TABLET | ORAL | 0 refills | Status: DC
Start: 2020-04-22 — End: 2020-07-15

## 2020-04-22 NOTE — ED Notes (Signed)
Pt informed by this charge RN at this time that both she and her two children are Covid+. Pt denies any questions at this time.

## 2020-04-22 NOTE — Discharge Instructions (Signed)
Follow-up with your regular doctor if not improving in 2 to 3 days.  Your Covid test should result in 6 to 24 hours.  If positive you should quarantine for an additional 10 to 14 days.  Due to you being very symptomatic I feel that he should continue to quarantine even if your test is negative.  You should get another test in 2 to 3 days if your test does return as negative.

## 2020-04-22 NOTE — ED Triage Notes (Signed)
Pt states is having covid symptoms since Wednesday and has had loss of taste. Pt appears in no acute distress. Pt states has had contact with a covid positive person at work.

## 2020-04-22 NOTE — ED Provider Notes (Signed)
[facility] Emergency Department Provider Note  ____________________________________________   First MD Initiated Contact with Patient 04/22/20 1922     (approximate)  I have reviewed the triage vital signs and the nursing notes.   HISTORY  Chief Complaint URI    HPI Tiffany Sanchez is a 24 y.o. female presents emergency department Covid-like symptoms.  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  States she started with runny nose, congestion and a "asthma attack "on Wednesday.  Today she has lost her sense of taste and smell.  She denies fever chills.  No chest pain or shortness of breath at this time.    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 Authorizing Provider  albuterol (VENTOLIN HFA) 108 (90 Base) MCG/ACT inhaler Inhale 2 puffs into the lungs every 4 (four) hours as needed. 04/22/20   Carry Weesner, Roselyn Bering, PA-C  azithromycin (ZITHROMAX Z-PAK) 250 MG tablet 2 pills today then 1 pill a day for 4 days 04/22/20   Sherrie Mustache Roselyn Bering,  PA-C  cetirizine (ZYRTEC) 10 MG tablet Take 10 mg by mouth daily.    [provider]  fluticasone (FLONASE) 50 MCG/ACT nasal spray Place 1 spray into both nostrils daily.    [provider]  HYDROcodone-acetaminophen (NORCO/VICODIN) 5-325 MG tablet Take 1-2 tablets by mouth every 6 (six) hours as needed for moderate pain or severe pain. 03/19/20 03/19/21  Shaune Pollack, MD  metroNIDAZOLE (FLAGYL) 500 MG tablet Take 1 tablet (500 mg total) by mouth 2 (two) times daily. 04/15/20   Tereso Newcomer, MD  Multiple Vitamin (MULTIVITAMIN WITH MINERALS) TABS tablet Take 1 tablet by mouth daily.    [provider]  Multiple Vitamins-Minerals (HAIR SKIN AND NAILS FORMULA PO) Take 1 tablet by mouth daily.    [provider]  ondansetron (ZOFRAN ODT) 4 MG disintegrating tablet Take 1 tablet (4 mg total) by mouth every 8 (eight) hours as needed for nausea or vomiting. 03/19/20   Shaune Pollack, MD  phenazopyridine (PYRIDIUM) 200 MG tablet Take 1 tablet (200 mg total) by mouth 3 (three) times daily as needed for pain (urethral spasm). 04/11/20   [place] Bing, MD  predniSONE (STERAPRED UNI-PAK 21 TAB) 10 MG (21) TBPK tablet Take 6 pills on day one then decrease by 1 pill each day 04/22/20   Faythe Ghee, PA-C    Allergies Penicillins  Family History  Problem Relation Age of Onset   Hypertension Mother  Hypertension Father    Diabetes Paternal Grandfather     Social History Social History   Tobacco Use   Smoking status: Never Smoker   Smokeless tobacco: Never Used  Substance Use Topics   Alcohol use: No   Drug use: No    Review of Systems  Constitutional: No fever/chills Eyes: No visual changes. ENT: No sore throat. Respiratory: Positive cough Cardiovascular: Denies chest pain Gastrointestinal: Denies abdominal pain Genitourinary: Negative for dysuria. Musculoskeletal: Negative for back pain. Skin: Negative for rash. Psychiatric: no mood  changes,     ____________________________________________   PHYSICAL EXAM:  VITAL SIGNS: ED Triage Vitals  Enc Vitals Group     BP 04/22/20 1852 (!) 132/91     Pulse Rate 04/22/20 1852 95     Resp 04/22/20 1852 16     Temp 04/22/20 1852 99.5 F (37.5 C)     Temp Source 04/22/20 1852 Oral     SpO2 04/22/20 1852 99 %     Weight 04/22/20 1854 235 lb (106.6 kg)     Height 04/22/20 1854 5\' 7"  (1.702 m)     Head Circumference --      Peak Flow --      Pain Score 04/22/20 1907 3     Pain Loc --      Pain Edu? --      Excl. in [place]? --     Constitutional: Alert and oriented. Well appearing and in no acute distress. Eyes: Conjunctivae are normal.  Head: Atraumatic. Nose: No congestion/rhinnorhea. Mouth/Throat: Mucous membranes are moist.   Neck:  supple no lymphadenopathy noted Cardiovascular: Normal rate, regular rhythm. Heart sounds are normal Respiratory: Normal respiratory effort.  No retractions, lungs c t a  Abd: soft nontender bs normal all 4 quad GU: deferred Musculoskeletal: FROM all extremities, warm and well perfused Neurologic:  Normal speech and language.  Skin:  Skin is warm, dry and intact. No rash noted. Psychiatric: Mood and affect are normal. Speech and behavior are normal.  ____________________________________________   LABS (all labs ordered are listed, but only abnormal results are displayed)  Labs Reviewed  SARS CORONAVIRUS 2 BY RT PCR (HOSPITAL ORDER, [facility]) - Abnormal; Notable for the following components:      Result Value   SARS Coronavirus 2 POSITIVE (*)    All other components within normal limits   ____________________________________________   ____________________________________________  RADIOLOGY  Chest x-ray  ____________________________________________   PROCEDURES  Procedure(s) performed: No  Procedures    ____________________________________________   INITIAL IMPRESSION / ASSESSMENT  AND PLAN / ED COURSE  Pertinent labs & imaging results that were available during my care of the patient were reviewed by me and considered in my medical decision making (see chart for details).   Patient is 24 year old female presents emergency department Covid-like symptoms.  See HPI physical exam shows patient appears stable.  Exam is unremarkable  Chest x-ray is negative Covid test is positive  I did call patient to notify her Covid test being positive.  She is to quarantine for 10 to 14 days.  She is given prescription for Z-Pak, steroid pack, and albuterol inhaler.  Return emergency department difficulty breathing.  She states she understands will comply.  She is discharged stable condition.    Tiffany Sanchez was evaluated in Emergency Department on 04/22/2020 for the symptoms described in the history of present illness. She was evaluated in the context of the global COVID-19 pandemic, which necessitated consideration that  the patient might be at risk for infection with the SARS-CoV-2 virus that causes COVID-19. Institutional protocols and algorithms that pertain to the evaluation of patients at risk for COVID-19 are in a state of rapid change based on information released by regulatory bodies including the CDC and federal and state organizations. These policies and algorithms were followed during the patient's care in the ED.   As part of my medical decision making, I reviewed the following data within the electronic MEDICAL RECORD NUMBER Nursing notes reviewed and incorporated, Labs reviewed , Old chart reviewed, Radiograph reviewed , Notes from prior ED visits and [place] Controlled Substance Database  ____________________________________________   FINAL CLINICAL IMPRESSION(S) / ED DIAGNOSES  Final diagnoses:  COVID-19      NEW MEDICATIONS STARTED DURING THIS VISIT:  Discharge Medication List as of 04/22/2020  8:40 PM    START taking these medications   Details  azithromycin (ZITHROMAX  Z-PAK) 250 MG tablet 2 pills today then 1 pill a day for 4 days, Normal    predniSONE (STERAPRED UNI-PAK 21 TAB) 10 MG (21) TBPK tablet Take 6 pills on day one then decrease by 1 pill each day, Normal         Note:  This document was prepared using Dragon voice recognition software and may include unintentional dictation errors.    Faythe Ghee, PA-C 04/22/20 2118    Arnaldo Natal, MD 04/22/20 2237

## 2020-04-23 ENCOUNTER — Telehealth: Payer: Self-pay | Admitting: Physician Assistant

## 2020-04-23 NOTE — Telephone Encounter (Signed)
Called to discuss with patient about Covid symptoms and the use of bamlanivimab/etesevimab or casirivimab/imdevimab, a monoclonal antibody infusion for those with mild to moderate Covid symptoms and at a high risk of hospitalization.  Pt is qualified for this infusion at the [facility] infusion center due to [employer] left to call back and send my chart message.  Cline Crock PA-C  [facility]

## 2020-04-23 NOTE — Telephone Encounter (Signed)
Patient called back and interested in the infusion but has no child care for her two young children who are also covid positive. Unfortunately, we cannot allow children into the infusion center. She will call us back if she can secure child care.   Cline Crock PA-C  [facility]

## 2020-05-18 ENCOUNTER — Ambulatory Visit: Payer: Medicaid Other

## 2020-06-17 ENCOUNTER — Other Ambulatory Visit: Payer: Self-pay

## 2020-06-17 ENCOUNTER — Other Ambulatory Visit (HOSPITAL_COMMUNITY)
Admission: RE | Admit: 2020-06-17 | Discharge: 2020-06-17 | Disposition: A | Discharge status: Home / Self Care | Payer: Medicaid Other | Source: Ambulatory Visit | Attending: Obstetrics & Gynecology | Admitting: Obstetrics & Gynecology

## 2020-06-17 ENCOUNTER — Ambulatory Visit (INDEPENDENT_AMBULATORY_CARE_PROVIDER_SITE_OTHER): Payer: Medicaid Other | Admitting: *Deleted

## 2020-06-17 VITALS — BP 137/89 | Wt 244.0 lb

## 2020-06-17 DIAGNOSIS — N898 Other specified noninflammatory disorders of vagina: Secondary | ICD-10-CM

## 2020-06-17 DIAGNOSIS — R300 Dysuria: Secondary | ICD-10-CM | POA: Diagnosis not present

## 2020-06-17 DIAGNOSIS — R102 Pelvic and perineal pain: Secondary | ICD-10-CM | POA: Diagnosis not present

## 2020-06-17 LAB — POCT URINALYSIS DIPSTICK

## 2020-06-17 MED ORDER — NITROFURANTOIN MONOHYD MACRO 100 MG PO CAPS: 100.0000 mg | ORAL_CAPSULE | Freq: Two times a day (BID) | ORAL | 0 refills | Status: AC

## 2020-06-17 NOTE — Progress Notes (Signed)
SUBJECTIVE: Tiffany Sanchez is a 24 y.o. female who complains of pelvic pain x 2 days, without fever, chills, or abnormal vaginal discharge or bleeding. Pt also complains of vaginal odor.   OBJECTIVE: Appears well, in no apparent distress.  Vital signs are normal. Urine dipstick shows positive for RBC's and positive for leukocytes.    ASSESSMENT:  Dysuria Vaginal odor  PLAN: Urine culture, CG/Cl, trich, BVAG and CVAG probe sent to lab.Treatment per orders. Call or return to clinic prn if these symptoms worsen or fail to improve as anticipated.

## 2020-06-17 NOTE — Progress Notes (Signed)
Patient was assessed and managed by nursing staff during this encounter. I have reviewed the chart and agree with the documentation and plan. I have also made any necessary editorial changes.  Nicholette Dolson, MD 06/17/2020 11:38 AM 

## 2020-06-18 LAB — URINE CULTURE

## 2020-06-20 LAB — CERVICOVAGINAL ANCILLARY ONLY
Bacterial Vaginitis (gardnerella): POSITIVE — AB
Candida Glabrata: NEGATIVE
Candida Vaginitis: NEGATIVE
Chlamydia: NEGATIVE
Comment: NEGATIVE
Comment: NEGATIVE
Comment: NEGATIVE
Comment: NEGATIVE
Comment: NEGATIVE
Comment: NORMAL
Neisseria Gonorrhea: NEGATIVE
Trichomonas: POSITIVE — AB

## 2020-06-21 ENCOUNTER — Telehealth: Payer: Self-pay

## 2020-06-21 MED ORDER — METRONIDAZOLE 500 MG PO TABS
500.0000 mg | ORAL_TABLET | Freq: Two times a day (BID) | ORAL | 0 refills | Status: DC
Start: 2020-06-21 — End: 2020-07-15

## 2020-06-21 NOTE — Telephone Encounter (Signed)
Patient is positive for BV and trichomonas she will need to be treated with a medication called Flagyl. 500 mg PO BID for 7 days. I have informed the patient of test results. She was advised not to have sexual intercourse until she and partner have been treated and finish with the medication. Patient advised understanding at this time.

## 2020-06-27 ENCOUNTER — Other Ambulatory Visit: Payer: Self-pay

## 2020-06-27 MED ORDER — FLUCONAZOLE 150 MG PO TABS: 150.0000 mg | ORAL_TABLET | Freq: Once | ORAL | 1 refills | Status: AC

## 2020-06-27 NOTE — Telephone Encounter (Signed)
Patient is requesting a diflucan be called into [facility] pharmacy 

## 2020-07-11 ENCOUNTER — Other Ambulatory Visit: Payer: Self-pay

## 2020-07-15 ENCOUNTER — Emergency Department: Payer: Medicaid Other

## 2020-07-15 ENCOUNTER — Encounter: Payer: Self-pay | Admitting: Emergency Medicine

## 2020-07-15 ENCOUNTER — Emergency Department
Admission: EM | Admit: 2020-07-15 | Discharge: 2020-07-15 | Disposition: A | Discharge status: Home / Self Care | Payer: Medicaid Other | Attending: Emergency Medicine | Admitting: Emergency Medicine

## 2020-07-15 ENCOUNTER — Other Ambulatory Visit: Payer: Self-pay

## 2020-07-15 DIAGNOSIS — I10 Essential (primary) hypertension: Secondary | ICD-10-CM | POA: Insufficient documentation

## 2020-07-15 DIAGNOSIS — Z79899 Other long term (current) drug therapy: Secondary | ICD-10-CM | POA: Insufficient documentation

## 2020-07-15 DIAGNOSIS — Y763 Surgical instruments, materials and obstetric and gynecological devices (including sutures) associated with adverse incidents: Secondary | ICD-10-CM | POA: Diagnosis not present

## 2020-07-15 DIAGNOSIS — T8332XA Displacement of intrauterine contraceptive device, initial encounter: Secondary | ICD-10-CM | POA: Insufficient documentation

## 2020-07-15 DIAGNOSIS — J45909 Unspecified asthma, uncomplicated: Secondary | ICD-10-CM | POA: Insufficient documentation

## 2020-07-15 DIAGNOSIS — N939 Abnormal uterine and vaginal bleeding, unspecified: Secondary | ICD-10-CM | POA: Diagnosis not present

## 2020-07-15 LAB — URINALYSIS, COMPLETE (UACMP) WITH MICROSCOPIC
Bacteria, UA: NONE SEEN
Bilirubin Urine: NEGATIVE
Glucose, UA: NEGATIVE mg/dL
Ketones, ur: NEGATIVE mg/dL
Nitrite: NEGATIVE
Protein, ur: 30 mg/dL — AB
RBC / HPF: >50 RBC/hpf — ABNORMAL HIGH (ref 0–5)
Specific Gravity, Urine: 1.024 (ref 1.005–1.030)
pH: 5 (ref 5.0–8.0)

## 2020-07-15 LAB — COMPREHENSIVE METABOLIC PANEL
ALT: 16 U/L (ref 0–44)
AST: 18 U/L (ref 15–41)
Albumin: 4.5 g/dL (ref 3.5–5.0)
Alkaline Phosphatase: 28 U/L — ABNORMAL LOW (ref 38–126)
Anion gap: 9 (ref 5–15)
BUN: 17 mg/dL (ref 6–20)
CO2: 26 mmol/L (ref 22–32)
Calcium: 9.6 mg/dL (ref 8.9–10.3)
Chloride: 104 mmol/L (ref 98–111)
Creatinine, Ser: 0.75 mg/dL (ref 0.44–1.00)
GFR calc Af Amer: >60 mL/min (ref >60)
GFR calc non Af Amer: >60 mL/min (ref >60)
Glucose, Bld: 83 mg/dL (ref 70–99)
Potassium: 3.9 mmol/L (ref 3.5–5.1)
Sodium: 139 mmol/L (ref 135–145)
Total Bilirubin: 0.9 mg/dL (ref 0.3–1.2)
Total Protein: 7.6 g/dL (ref 6.5–8.1)

## 2020-07-15 LAB — CBC
HCT: 37 % (ref 36.0–46.0)
Hemoglobin: 11.4 g/dL — ABNORMAL LOW (ref 12.0–15.0)
MCH: 25.7 pg — ABNORMAL LOW (ref 26.0–34.0)
MCHC: 30.8 g/dL (ref 30.0–36.0)
MCV: 83.3 fL (ref 80.0–100.0)
Platelets: 270 10*3/uL (ref 150–400)
RBC: 4.44 MIL/uL (ref 3.87–5.11)
RDW: 12.6 % (ref 11.5–15.5)
WBC: 5.1 10*3/uL (ref 4.0–10.5)
nRBC: 0 % (ref 0.0–0.2)

## 2020-07-15 LAB — WET PREP, GENITAL
Clue Cells Wet Prep HPF POC: NONE SEEN
Sperm: NONE SEEN
Trich, Wet Prep: NONE SEEN
Yeast Wet Prep HPF POC: NONE SEEN

## 2020-07-15 LAB — POCT PREGNANCY, URINE: Preg Test, Ur: NEGATIVE

## 2020-07-15 MED ORDER — TRANEXAMIC ACID 650 MG PO TABS
650.0000 mg | ORAL_TABLET | Freq: Three times a day (TID) | ORAL | 0 refills | Status: DC
Start: 2020-07-15 — End: 2020-07-15

## 2020-07-15 MED ORDER — NORGESTIM-ETH ESTRAD TRIPHASIC 0.18/0.215/0.25 MG-35 MCG PO TABS
1.0000 | ORAL_TABLET | Freq: Every day | ORAL | 2 refills | Status: DC
Start: 2020-07-15 — End: 2021-06-19

## 2020-07-15 NOTE — ED Triage Notes (Signed)
Vaginal bldg with large clots since Tuesday or Wednesday.  This is second week of her period

## 2020-07-15 NOTE — ED Provider Notes (Signed)
[facility] Emergency Department Provider Note ____________________________________________  Time seen: 1017  I have reviewed the triage vital signs and the nursing notes.  HISTORY  Chief Complaint  Vaginal Bleeding  HPI Tiffany Sanchez is a 24 y.o. female presents to the ED for evaluation of irregular vaginal bleeding.  Patient with a history of irregular menses, presents after concern due to passing large amounts of blood and large clots over the last week.  She describes her LMP started on 8/17.  She is continued to have heavy bleeding in the interim.  She notes the last time this occurred was in November, when she apparently lost her IUD spontaneously, and became pregnant in the interim.  She reports having the same IUD replaced in April of this year, presents today for concern over similar symptoms but she is not aware of the last when she was able to palpate the IUD strings.  She denies any nausea, vomiting, or dizziness patient also denies any significant pelvic pain, chest pain, or shortness of breath.  Past Medical History:  Diagnosis Date  . Anemia   . Asthma    no hospitalizations, no recent attacks (since childhood)  . Cervical dysplasia   . Complication of anesthesia    one sided epidural  . History of gestational hypertension   . Hypertension   . Ovarian cyst   . UTI (urinary tract infection)     Patient Active Problem List   Diagnosis Date Noted  . Trichimoniasis 04/20/2020  . Encounter for IUD insertion 02/17/2020  . Family history of breast cancer 10/22/2019  . 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 BMI 50.0-59.9, adult (HCC) 06/03/2018  . History of herpes genitalis 06/03/2018    Past Surgical History:  Procedure Laterality Date  . CESAREAN SECTION N/A 06/28/2018   Procedure: CESAREAN SECTION;  Surgeon: Levie Heritage, DO;  Location: [facility];  Service: Obstetrics;  Laterality: N/A;  . THERAPEUTIC ABORTION      Prior  to Admission medications   Medication Sig Start Date End Date Taking? Authorizing Provider  albuterol (VENTOLIN HFA) 108 (90 Base) MCG/ACT inhaler Inhale 2 puffs into the lungs every 4 (four) hours as needed. 04/22/20   Fisher, Roselyn Bering, PA-C  cetirizine (ZYRTEC) 10 MG tablet Take 10 mg by mouth daily.    [provider]  fluticasone (FLONASE) 50 MCG/ACT nasal spray Place 1 spray into both nostrils daily.    [provider]  Multiple Vitamin (MULTIVITAMIN WITH MINERALS) TABS tablet Take 1 tablet by mouth daily.    [provider]  Multiple Vitamins-Minerals (HAIR SKIN AND NAILS FORMULA PO) Take 1 tablet by mouth daily.    [provider]  Norgestimate-Ethinyl Estradiol Triphasic (ORTHO TRI-CYCLEN, 28,) 0.18/0.215/0.25 MG-35 MCG tablet Take 1 tablet by mouth daily. 07/15/20 10/07/20  Nicandro Perrault, Charlesetta Ivory, PA-C  ondansetron (ZOFRAN ODT) 4 MG disintegrating tablet Take 1 tablet (4 mg total) by mouth every 8 (eight) hours as needed for nausea or vomiting. 03/19/20   Shaune Pollack, MD   Allergies Penicillins  Family History  Problem Relation Age of Onset  . Hypertension Mother   . Hypertension Father   . Diabetes Paternal Grandfather     Social History Social History   Tobacco Use  . Smoking status: Never Smoker  . Smokeless tobacco: Never Used  Vaping Use  . Vaping Use: Never used  Substance Use Topics  . Alcohol use: No  . Drug use: No  Review of Systems  Constitutional: Negative for fever. Cardiovascular: Negative for chest pain. Respiratory: Negative for shortness of breath. Gastrointestinal: Negative for abdominal pain, vomiting and diarrhea. Genitourinary: Negative for dysuria. Reports vaginal bleeding Musculoskeletal: Negative for back pain. Skin: Negative for rash. Neurological: Negative for headaches, focal weakness or numbness. ____________________________________________  PHYSICAL EXAM:  VITAL SIGNS: ED Triage Vitals  Enc  Vitals Group     BP 07/15/20 0940 123/60     Pulse Rate 07/15/20 0940 92     Resp 07/15/20 0940 18     Temp 07/15/20 0940 98.9 F (37.2 C)     Temp Source 07/15/20 0940 Oral     SpO2 07/15/20 0940 100 %     Weight 07/15/20 0951 242 lb 8.1 oz (110 kg)     Height 07/15/20 0951 5\' 7"  (1.702 m)     Head Circumference --      Peak Flow --      Pain Score 07/15/20 0833 5     Pain Loc --      Pain Edu? --      Excl. in [place]? --     Constitutional: Alert and oriented. Well appearing and in no distress. Head: Normocephalic and atraumatic. Eyes: Conjunctivae are normal. Normal extraocular movements Cardiovascular: Normal rate, regular rhythm. Normal distal pulses. Respiratory: Normal respiratory effort. No wheezes/rales/rhonchi. Gastrointestinal: Soft and nontender. No distention. GU: Normal external genitalia.  Scant dark blood in the vault.  Mucus from the cervical os.  No IUD strings are appreciated at the os. Musculoskeletal: Nontender with normal range of motion in all extremities.  Neurologic:  Normal gait without ataxia. Normal speech and language. No gross focal neurologic deficits are appreciated. Skin:  Skin is warm, dry and intact. No rash noted. Psychiatric: Mood and affect are normal. Patient exhibits appropriate insight and judgment. ____________________________________________   LABS (pertinent positives/negatives)  Labs Reviewed  WET PREP, GENITAL - Abnormal; Notable for the following components:      Result Value   WBC, Wet Prep HPF POC FEW (*)    All other components within normal limits  COMPREHENSIVE METABOLIC PANEL - Abnormal; Notable for the following components:   Alkaline Phosphatase 28 (*)    All other components within normal limits  CBC - Abnormal; Notable for the following components:   Hemoglobin 11.4 (*)    MCH 25.7 (*)    All other components within normal limits  URINALYSIS, COMPLETE (UACMP) WITH MICROSCOPIC - Abnormal; Notable for the following  components:   Color, Urine YELLOW (*)    APPearance CLOUDY (*)    Hgb urine dipstick LARGE (*)    Protein, ur 30 (*)    Leukocytes,Ua TRACE (*)    RBC / HPF >50 (*)    All other components within normal limits  POC URINE PREG, ED  POCT PREGNANCY, URINE   ____________________________________________   RADIOLOGY  IMPRESSION: 1. Visualization of the endometrium somewhat limited as patient is actively bleeding. No IUD visualized.  2. Indeterminate hyperechoic area in the left ovary measuring 0.9 cm, possibly an early or collapsed corpus luteum. Recommend follow-up pelvic ultrasound in 6-8 weeks to ensure resolution. ____________________________________________  PROCEDURES  Procedures ____________________________________________  INITIAL IMPRESSION / ASSESSMENT AND PLAN / ED COURSE  Patient with ED evaluation of abnormal vaginal bleeding over the last 2 weeks.  Patient reports her LMP was 8/17.  She has continued to experience heavy intermittent vaginal bleeding with clots since that time.  She presented to the ED for concern  over a possible pregnancy, because she experienced a similar episode in November where she actually lost her previously placed IUD and was found to be [redacted] weeks pregnant.  On exam today pelvic exam did not reveal visible IUD strings out of the cervical os.  Ultrasound also did not confirm an IUD placed within the uterus.  We discussed possible management this time and is a more appropriate to manage with hormonal therapy given the patient both management of her abnormal bleeding, and contraceptive coverage.  She will follow up with her primary GYN provider for ongoing treatment and a more definitive, permanent contraceptive solution.  Tiffany Sanchez was evaluated in Emergency Department on 07/15/2020 for the symptoms described in the history of present illness. She was evaluated in the context of the global COVID-19 pandemic, which necessitated consideration that the  patient might be at risk for infection with the SARS-CoV-2 virus that causes COVID-19. Institutional protocols and algorithms that pertain to the evaluation of patients at risk for COVID-19 are in a state of rapid change based on information released by regulatory bodies including the CDC and federal and state organizations. These policies and algorithms were followed during the patient's care in the ED. ____________________________________________  FINAL CLINICAL IMPRESSION(S) / ED DIAGNOSES  Final diagnoses:  IUD strings lost  Abnormal vaginal bleeding      Karmen Stabs, Charlesetta Ivory, PA-C 07/15/20 1932    Arnaldo Natal, MD 07/18/20 [phone]

## 2020-07-15 NOTE — Discharge Instructions (Signed)
You exam and labs are normal. Your [place] did not show an IUD in place. Start the oral contraceptive pills as directed. Follow-up with your GYN provider definitive management of bleeding and permanent birth control. Return as needed.

## 2020-07-15 NOTE — ED Notes (Signed)
See triage note   Presents with vaginal bleeding  States she started her period 2 weeks ago  Now passing large clots    having some vaginal burning  Also having some dysuria

## 2020-07-15 NOTE — ED Notes (Signed)
patinet had said in triage that she wanted to be checked for std--chlamydia

## 2020-08-03 ENCOUNTER — Telehealth (INDEPENDENT_AMBULATORY_CARE_PROVIDER_SITE_OTHER): Payer: Medicaid Other | Admitting: Family Medicine

## 2020-08-03 ENCOUNTER — Encounter: Payer: Self-pay | Admitting: Family Medicine

## 2020-08-03 DIAGNOSIS — Z30431 Encounter for routine checking of intrauterine contraceptive device: Secondary | ICD-10-CM

## 2020-08-03 DIAGNOSIS — N838 Other noninflammatory disorders of ovary, fallopian tube and broad ligament: Secondary | ICD-10-CM | POA: Diagnosis not present

## 2020-08-03 NOTE — Progress Notes (Signed)
GYNECOLOGY VIRTUAL VISIT ENCOUNTER NOTE  Provider location: Center for [facility]   I connected with Tiffany Sanchez on 08/03/20 at  1:15 PM EDT by MyChart Video Encounter at home and verified that I am speaking with the correct person using two identifiers.   I discussed the limitations, risks, security and privacy concerns of performing an evaluation and management service virtually and the availability of in person appointments. I also discussed with the patient that there may be a patient responsible charge related to this service. The patient expressed understanding and agreed to proceed.   History:  Tiffany Sanchez is a 24 y.o. [phone] female being evaluated today for lost IUD, she never had an IUD string check. Had previously lost her IUD before.Pelvic sonogram shows no IUD. Has not had an x-ray to verify that her IUD is not in her abdominal cavity. Has not wanted to be on OCs due to elevated blood pressure in the past.  She is wondering if we see endometriosis.  She denies any abnormal vaginal discharge, bleeding, pelvic pain or other concerns.       Past Medical History:  Diagnosis Date  . Anemia   . Asthma    no hospitalizations, no recent attacks (since childhood)  . Cervical dysplasia   . Complication of anesthesia    one sided epidural  . History of gestational hypertension   . Hypertension   . Ovarian cyst   . UTI (urinary tract infection)    Past Surgical History:  Procedure Laterality Date  . CESAREAN SECTION N/A 06/28/2018   Procedure: CESAREAN SECTION;  Surgeon: Levie Heritage, DO;  Location: [facility] BIRTHING SUITES;  Service: Obstetrics;  Laterality: N/A;  . THERAPEUTIC ABORTION     The following portions of the patient's history were reviewed and updated as appropriate: allergies, current medications, past family history, past medical history, past social history, past surgical history and problem list.   Health Maintenance:  Normal pap  on 08/01/2018.    Review of Systems:  Pertinent items noted in HPI and remainder of comprehensive ROS otherwise negative.  Physical Exam:   General:  Alert, oriented and cooperative. Patient appears to be in no acute distress.  Mental Status: Normal mood and affect. Normal behavior. Normal judgment and thought content.   Respiratory: Normal respiratory effort, no problems with respiration noted  Rest of physical exam deferred due to type of encounter  Labs and Imaging No results found for this or any previous visit (from the past 336 hour(s)). US PELVIC COMPLETE WITH TRANSVAGINAL  Result Date: 07/15/2020 CLINICAL DATA:  IUD string lost. EXAM: TRANSABDOMINAL AND TRANSVAGINAL ULTRASOUND OF PELVIS TECHNIQUE: Both transabdominal and transvaginal ultrasound examinations of the pelvis were performed. Transabdominal technique was performed for global imaging of the pelvis including uterus, ovaries, adnexal regions, and pelvic cul-de-sac. It was necessary to proceed with endovaginal exam following the transabdominal exam to visualize the endometrium and adnexa. COMPARISON:  None FINDINGS: Uterus Measurements: 8.2 x 5.5 x 6.7 cm = volume: 158 mL. No fibroids or other mass visualized. Endometrium Thickness: 1.3 cm. Patient is actively bleeding which limits evaluation. No IUD visualized within the uterus. Right ovary Measurements: 3.2 x 1.8 x 2.2 cm = volume: 6.8 mL. Normal appearance/no adnexal mass. Left ovary Measurements: 3.6 x 2.5 x 3.8 cm = volume: 17.5 mL. There is a small to hyperechoic area measuring 0.7 x 0.9 x 0.6 cm, which is indeterminate but may represent an early or  collapsed corpus luteum. Other findings No abnormal free fluid. IMPRESSION: 1. Visualization of the endometrium somewhat limited as patient is actively bleeding. No IUD visualized. 2. Indeterminate hyperechoic area in the left ovary measuring 0.9 cm, possibly an early or collapsed corpus luteum. Recommend follow-up pelvic ultrasound in  6-8 weeks to ensure resolution. Electronically Signed   By: Emmaline Kluver M.D.   On: 07/15/2020 13:48       Assessment and Plan:         IUD check up - Check abdominal x-ray to ensure that it is not in the abdominal cavity.  Would not recommend another IUD. - Plan: DG Abd 1 View  Ovarian mass - Likely corpus luteum, will get follow-up ultrasound to ensure resolution. - Plan: US PELVIC COMPLETE WITH TRANSVAGINAL     I discussed the assessment and treatment plan with the patient. The patient was provided an opportunity to ask questions and all were answered. The patient agreed with the plan and demonstrated an understanding of the instructions.   The patient was advised to call back or seek an in-person evaluation/go to the ED if the symptoms worsen or if the condition fails to improve as anticipated.  I provided 14 minutes of face-to-face time during this encounter.Total time isTotal time is 22 min.  Return in about 6 weeks (around 09/14/2020) for in person, a follow-up, birth control consult.   Reva Bores, MD Center for [employer], [facility]

## 2020-08-03 NOTE — Progress Notes (Signed)
IUD fell out, would like to discuss ultrasound results.   Is currently on OCP's that ED gave her, which is helping her periods  I connected with  [name] on 08/03/20 at  1:15 PM EDT by telephone and verified that I am speaking with the correct person using two identifiers.   I discussed the limitations, risks, security and privacy concerns of performing an evaluation and management service by telephone and the availability of in person appointments. I also discussed with the patient that there may be a patient responsible charge related to this service. The patient expressed understanding and agreed to proceed.  Scheryl Marten, RN 08/03/2020  1:19 PM

## 2020-08-05 ENCOUNTER — Encounter: Payer: Self-pay | Admitting: Family Medicine

## 2020-08-10 ENCOUNTER — Telehealth: Payer: Self-pay | Admitting: *Deleted

## 2020-08-10 MED ORDER — NITROFURANTOIN MONOHYD MACRO 100 MG PO CAPS: 100.0000 mg | ORAL_CAPSULE | Freq: Two times a day (BID) | ORAL | 0 refills | Status: DC

## 2020-08-10 NOTE — Telephone Encounter (Signed)
Pt called stating she thinks she has UTI, she did urine dip at her work and had trace of blood and leukocytes and is having urinary frequency and low back pain. Pt just started a new jab and is currently unable to come by the office to leave [place] a sample to send for culture. Informed pt that I would check with a provider to see if we can treat for UTI. Okay per Dr Shawnie Pons to treat and if patient doe snot get better that she ill need to be seen with [place] I=or somewhere. Pt informed and verbalizes understanding.

## 2020-09-02 ENCOUNTER — Other Ambulatory Visit: Payer: Medicaid Other

## 2020-09-15 ENCOUNTER — Ambulatory Visit: Payer: Medicaid Other | Admitting: Family Medicine

## 2020-09-16 ENCOUNTER — Encounter: Payer: Self-pay | Admitting: Radiology

## 2020-10-04 ENCOUNTER — Ambulatory Visit: Payer: Medicaid Other | Admitting: Family Medicine

## 2020-10-20 ENCOUNTER — Other Ambulatory Visit: Payer: Self-pay | Admitting: Nurse Practitioner

## 2020-10-20 DIAGNOSIS — R350 Frequency of micturition: Secondary | ICD-10-CM

## 2020-10-20 DIAGNOSIS — R109 Unspecified abdominal pain: Secondary | ICD-10-CM

## 2020-11-01 ENCOUNTER — Emergency Department
Admission: EM | Admit: 2020-11-01 | Discharge: 2020-11-01 | Disposition: A | Discharge status: Home / Self Care | Payer: Medicaid Other | Attending: Emergency Medicine | Admitting: Emergency Medicine

## 2020-11-01 ENCOUNTER — Encounter: Payer: Self-pay | Admitting: Emergency Medicine

## 2020-11-01 ENCOUNTER — Other Ambulatory Visit: Payer: Self-pay

## 2020-11-01 DIAGNOSIS — Z7952 Long term (current) use of systemic steroids: Secondary | ICD-10-CM | POA: Insufficient documentation

## 2020-11-01 DIAGNOSIS — J45909 Unspecified asthma, uncomplicated: Secondary | ICD-10-CM | POA: Insufficient documentation

## 2020-11-01 DIAGNOSIS — N760 Acute vaginitis: Secondary | ICD-10-CM

## 2020-11-01 DIAGNOSIS — A5901 Trichomonal vulvovaginitis: Secondary | ICD-10-CM

## 2020-11-01 DIAGNOSIS — I10 Essential (primary) hypertension: Secondary | ICD-10-CM | POA: Insufficient documentation

## 2020-11-01 DIAGNOSIS — B9689 Other specified bacterial agents as the cause of diseases classified elsewhere: Secondary | ICD-10-CM | POA: Diagnosis not present

## 2020-11-01 DIAGNOSIS — N898 Other specified noninflammatory disorders of vagina: Secondary | ICD-10-CM | POA: Diagnosis present

## 2020-11-01 LAB — URINALYSIS, COMPLETE (UACMP) WITH MICROSCOPIC
Bacteria, UA: NONE SEEN
Bilirubin Urine: NEGATIVE
Glucose, UA: NEGATIVE mg/dL
Hgb urine dipstick: NEGATIVE
Ketones, ur: NEGATIVE mg/dL
Nitrite: NEGATIVE
Protein, ur: NEGATIVE mg/dL
Specific Gravity, Urine: 1.029 (ref 1.005–1.030)
pH: 5 (ref 5.0–8.0)

## 2020-11-01 LAB — CHLAMYDIA/NGC RT PCR (ARMC ONLY)
Chlamydia Tr: NOT DETECTED
N gonorrhoeae: NOT DETECTED

## 2020-11-01 LAB — WET PREP, GENITAL
Clue Cells Wet Prep HPF POC: NONE SEEN
Sperm: NONE SEEN
Trich, Wet Prep: NONE SEEN
Yeast Wet Prep HPF POC: NONE SEEN

## 2020-11-01 MED ORDER — CEFTRIAXONE SODIUM 250 MG IJ SOLR
250.0000 mg | Freq: Once | INTRAMUSCULAR | Status: AC
  Administered 2020-11-01: 11:00:00 250 mg via INTRAMUSCULAR
  Filled 2020-11-01: qty 250

## 2020-11-01 MED ORDER — TINIDAZOLE 500 MG PO TABS: 2.0000 g | ORAL_TABLET | Freq: Every day | ORAL | 0 refills | Status: AC

## 2020-11-01 MED ORDER — AZITHROMYCIN 500 MG PO TABS
1000.0000 mg | ORAL_TABLET | Freq: Once | ORAL | Status: AC
  Administered 2020-11-01: 11:00:00 1000 mg via ORAL
  Filled 2020-11-01: qty 2

## 2020-11-01 MED ORDER — LIDOCAINE HCL (PF) 1 % IJ SOLN
INTRAMUSCULAR | Status: AC
  Filled 2020-11-01: qty 5

## 2020-11-01 NOTE — ED Triage Notes (Signed)
Lower back and pelvic pain x 1 month.  Also c/o vaginal itching x 4 days.  Patient states she has had trichomonas in the past and is concerned she may have PID.  Has also had several UTI's over the past 6 months.  AAOx3.  Skin warm and dry. NAD

## 2020-11-01 NOTE — ED Notes (Signed)
Pt c/o lower back and pelvic pain for the past month, states she has a hx of trichomonas twice this year and is concerned she may have PID. States she does have some vaginal irritation.

## 2020-11-01 NOTE — Discharge Instructions (Signed)
Recheck with your doctor in 1 week, will need a repeat test to determine if the WBCs have resolved.   Continue to refrain from sex until evaluated by your physician If the infection has cleared at that time, I would recommend your partner be treated with the same regimen we did today REturn to the ER if you are worsening I gave you information on PID so you will know what to look for, you do not have PID at this time

## 2020-11-01 NOTE — ED Provider Notes (Signed)
[facility] Emergency Department Provider Note  ____________________________________________   Event Date/Time   First MD Initiated Contact with Patient 11/01/20 1006     (approximate)  I have reviewed the triage vital signs and the nursing notes.   HISTORY  Chief Complaint Pelvic Pain, Vaginal Itching, and Back Pain    HPI Tiffany Sanchez is a 24 y.o. female presents emergency department complaining of continued pelvic pain and some discharge.  Patient was treated for trichomonas recently.  She did 7 days of Flagyl.  Partner was treated with 2 g of Flagyl x1.  They have not had sex since then.  States that she has been treated for this twice this year and is worried about PID.  She has had no fever, bleeding, chills or severe pain of the pelvis.    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 Authorizing Provider  albuterol (VENTOLIN HFA) 108 (90 Base) MCG/ACT inhaler Inhale 2 puffs into the lungs every 4 (four) hours as needed. 04/22/20   Lanissa Cashen, Roselyn Bering, PA-C  cetirizine  (ZYRTEC) 10 MG tablet Take 10 mg by mouth daily.    [provider]  fluticasone (FLONASE) 50 MCG/ACT nasal spray Place 1 spray into both nostrils daily.    [provider]  Multiple Vitamin (MULTIVITAMIN WITH MINERALS) TABS tablet Take 1 tablet by mouth daily.    [provider]  Multiple Vitamins-Minerals (HAIR SKIN AND NAILS FORMULA PO) Take 1 tablet by mouth daily.    [provider]  nitrofurantoin, macrocrystal-monohydrate, (MACROBID) 100 MG capsule Take 1 capsule (100 mg total) by mouth 2 (two) times daily. 08/10/20   Reva Bores, MD  Norgestimate-Ethinyl Estradiol Triphasic (ORTHO TRI-CYCLEN, 28,) 0.18/0.215/0.25 MG-35 MCG tablet Take 1 tablet by mouth daily. 07/15/20 10/07/20  Menshew, Charlesetta Ivory, PA-C  ondansetron (ZOFRAN ODT) 4 MG disintegrating tablet Take 1 tablet (4 mg total) by mouth every 8 (eight) hours as needed for nausea or vomiting. 03/19/20   Shaune Pollack, MD  tinidazole (TINDAMAX) 500 MG tablet Take 4 tablets (2,000 mg total) by mouth daily with breakfast for 7 days. 11/01/20 11/08/20  Faythe Ghee, PA-C    Allergies Penicillins  Family History  Problem Relation Age of Onset   Hypertension Mother    Hypertension Father    Diabetes Paternal Grandfather     Social History Social History   Tobacco Use   Smoking status: Never Smoker   Smokeless tobacco: Never Used  Building services engineer Use:  Never used  Substance Use Topics   Alcohol use: No   Drug use: No    Review of Systems  Constitutional: No fever/chills Eyes: No visual changes. ENT: No sore throat. Respiratory: Denies cough Cardiovascular: Denies chest pain Gastrointestinal: Denies abdominal pain Genitourinary: Negative for dysuria.  Positive pelvic pain Musculoskeletal: Negative for back pain. Skin: Negative for rash. Psychiatric: no mood changes,     ____________________________________________   PHYSICAL EXAM:  VITAL SIGNS: ED Triage  Vitals  Enc Vitals Group     BP 11/01/20 0821 (!) 151/78     Pulse Rate 11/01/20 0821 (!) 104     Resp 11/01/20 0821 16     Temp 11/01/20 0821 98.9 F (37.2 C)     Temp Source 11/01/20 0821 Oral     SpO2 11/01/20 0821 99 %     Weight 11/01/20 0819 242 lb 8.1 oz (110 kg)     Height 11/01/20 0819 5\' 7"  (1.702 m)     Head Circumference --      Peak Flow --      Pain Score 11/01/20 0819 5     Pain Loc --      Pain Edu? --      Excl. in GC? --     Constitutional: Alert and oriented. Well appearing and in no acute distress. Eyes: Conjunctivae are normal.  Head: Atraumatic. Nose: No congestion/rhinnorhea. Mouth/Throat: Mucous membranes are moist.   Neck:  supple no lymphadenopathy noted Cardiovascular: Normal rate, regular rhythm.  Respiratory: Normal respiratory effort.  No retractions,  Abd: soft nontender bs normal all 4 quad GU: deferred by patient, self swab was obtained Musculoskeletal: FROM all extremities, warm and well perfused Neurologic:  Normal speech and language.  Skin:  Skin is warm, dry and intact. No rash noted. Psychiatric: Mood and affect are normal. Speech and behavior are normal.  ____________________________________________   LABS (all labs ordered are listed, but only abnormal results are displayed)  Labs Reviewed  WET PREP, GENITAL - Abnormal; Notable for the following components:      Result Value   WBC, Wet Prep HPF POC MODERATE (*)    All other components within normal limits  URINALYSIS, COMPLETE (UACMP) WITH MICROSCOPIC - Abnormal; Notable for the following components:   Color, Urine YELLOW (*)    APPearance CLOUDY (*)    Leukocytes,Ua LARGE (*)    All other components within normal limits  CHLAMYDIA/NGC RT PCR ([facility] ONLY)  URINE CULTURE   ____________________________________________   ____________________________________________  RADIOLOGY    ____________________________________________   PROCEDURES  Procedure(s)  performed: No  Procedures    ____________________________________________   INITIAL IMPRESSION / ASSESSMENT AND PLAN / ED COURSE  Pertinent labs & imaging results that were available during my care of the patient were reviewed by me and considered in my medical decision making (see chart for details).   Patient is 24 year old female presents with concerns of PID.  See HPI.  Physical exam shows patient appears stable.  Fever.  Slight tenderness noted in the areas adjacent to the uterus.  DDx: STD, persistent trichomonas, PID  Wet prep shows large amount of WBCs, urinalysis has large WBCs GC/chlamydia is negative  I do not feel the patient has PID she has no fever or severe pain of the pelvic area.  I do feel that she may have a resistant case of trichomonas.  I will treat her empirically for all STDs while here in the ED.  She was given Rocephin 250 mg  IM, Zithromax 1 g p.o., and a prescription for Tindamax 2 g daily for 7 days.  This was per up-to-date recommendations by the CDC for resistant trichomonas  Explained all findings to the patient.  She should continue to refrain from sex with her partner until we make sure all infection is clear.  She has a appointment with her GYN scheduled she is to keep this appointment for recheck next week.  Return emergency department worsening.     Tiffany Sanchez was evaluated in Emergency Department on 11/01/2020 for the symptoms described in the history of present illness. She was evaluated in the context of the global COVID-19 pandemic, which necessitated consideration that the patient might be at risk for infection with the SARS-CoV-2 virus that causes COVID-19. Institutional protocols and algorithms that pertain to the evaluation of patients at risk for COVID-19 are in a state of rapid change based on information released by regulatory bodies including the CDC and federal and state organizations. These policies and algorithms were followed during  the patient's care in the ED.    As part of my medical decision making, I reviewed the following data within the electronic MEDICAL RECORD NUMBER Nursing notes reviewed and incorporated, Labs reviewed , Old chart reviewed, Notes from prior ED visits and [place] Controlled Substance Database  ____________________________________________   FINAL CLINICAL IMPRESSION(S) / ED DIAGNOSES  Final diagnoses:  Acute vaginitis  Trichomonal vaginitis      NEW MEDICATIONS STARTED DURING THIS VISIT:  New Prescriptions   TINIDAZOLE (TINDAMAX) 500 MG TABLET    Take 4 tablets (2,000 mg total) by mouth daily with breakfast for 7 days.     Note:  This document was prepared using Dragon voice recognition software and may include unintentional dictation errors.    Faythe Ghee, PA-C 11/01/20 1112    Minna Antis, MD 11/01/20 1423

## 2020-11-02 LAB — URINE CULTURE

## 2020-11-08 ENCOUNTER — Inpatient Hospital Stay: Admission: RE | Admit: 2020-11-08 | Payer: Medicaid Other | Source: Ambulatory Visit

## 2020-11-10 ENCOUNTER — Encounter: Payer: Self-pay | Admitting: Obstetrics & Gynecology

## 2020-11-10 ENCOUNTER — Other Ambulatory Visit: Payer: Self-pay

## 2020-11-10 ENCOUNTER — Ambulatory Visit (INDEPENDENT_AMBULATORY_CARE_PROVIDER_SITE_OTHER): Payer: Medicaid Other | Admitting: Obstetrics & Gynecology

## 2020-11-10 ENCOUNTER — Other Ambulatory Visit (HOSPITAL_COMMUNITY)
Admission: RE | Admit: 2020-11-10 | Discharge: 2020-11-10 | Disposition: A | Discharge status: Home / Self Care | Payer: Medicaid Other | Source: Ambulatory Visit | Attending: Obstetrics & Gynecology | Admitting: Obstetrics & Gynecology

## 2020-11-10 VITALS — BP 148/86 | Ht 67.0 in | Wt 249.6 lb

## 2020-11-10 DIAGNOSIS — Z8619 Personal history of other infectious and parasitic diseases: Secondary | ICD-10-CM

## 2020-11-10 DIAGNOSIS — Z01419 Encounter for gynecological examination (general) (routine) without abnormal findings: Secondary | ICD-10-CM

## 2020-11-10 DIAGNOSIS — R87612 Low grade squamous intraepithelial lesion on cytologic smear of cervix (LGSIL): Secondary | ICD-10-CM

## 2020-11-10 HISTORY — DX: Low grade squamous intraepithelial lesion on cytologic smear of cervix (LGSIL): R87.612

## 2020-11-10 NOTE — Progress Notes (Signed)
GYNECOLOGY ANNUAL PREVENTATIVE CARE ENCOUNTER NOTE  History:     Tiffany Sanchez is a 24 y.o. [phone] female here for a routine annual gynecologic exam.  Current complaints: had positive tests for trichomonal vaginitis about four times during the year despite Metronidazole treatments.  Was given Tinidazole recently, there was concern about Metronidazole resistance. She wants to check that it is treated, also wanted routine STI screening. Reports periodic pelvic cramping, not currently.   Denies current abnormal vaginal bleeding, discharge, pelvic pain, problems with intercourse or other gynecologic concerns.    Gynecologic History No LMP recorded. (Menstrual status: Irregular Periods). Contraception: OCPs Last Pap: 08/01/2018. Results were: normal  Obstetric History OB History  Gravida Para Term Preterm AB Living  4 2 2   2 2   SAB IAB Ectopic Multiple Live Births    1   0 2    # Outcome Date GA Lbr Len/2nd Weight Sex Delivery Anes PTL Lv  4 IAB 11/10/19     TAB     3 Term 06/2018    M CS-LTranv   LIV  2 Term 04/14/17 [redacted]w[redacted]d / 00:17 8 lb 4.3 oz (3.75 kg) M Vag-Spont EPI  LIV     Birth Comments: N/A  1 AB 08/2014     TAB       Past Medical History:  Diagnosis Date  . Anemia   . Asthma    no hospitalizations, no recent attacks (since childhood)  . Cervical dysplasia   . Complication of anesthesia    one sided epidural  . History of gestational hypertension   . Hypertension   . Ovarian cyst   . UTI (urinary tract infection)     Past Surgical History:  Procedure Laterality Date  . CESAREAN SECTION N/A 06/28/2018   Procedure: CESAREAN SECTION;  Surgeon: 08/28/2018, DO;  Location: [facility] BIRTHING SUITES;  Service: Obstetrics;  Laterality: N/A;  . THERAPEUTIC ABORTION      Current Outpatient Medications on File Prior to Visit  Medication Sig Dispense Refill  . albuterol (VENTOLIN HFA) 108 (90 Base) MCG/ACT inhaler Inhale 2 puffs into the lungs every 4 (four) hours as  needed. 16 g 3  . cetirizine (ZYRTEC) 10 MG tablet Take 10 mg by mouth daily.    . fluticasone (FLONASE) 50 MCG/ACT nasal spray Place 1 spray into both nostrils daily.    . Multiple Vitamin (MULTIVITAMIN WITH MINERALS) TABS tablet Take 1 tablet by mouth daily.    . Multiple Vitamins-Minerals (HAIR SKIN AND NAILS FORMULA PO) Take 1 tablet by mouth daily.    . phentermine 37.5 MG capsule Take 37.5 mg by mouth every morning.    . nitrofurantoin, macrocrystal-monohydrate, (MACROBID) 100 MG capsule Take 1 capsule (100 mg total) by mouth 2 (two) times daily. (Patient not taking: Reported on 11/10/2020) 10 capsule 0  . Norgestimate-Ethinyl Estradiol Triphasic (ORTHO TRI-CYCLEN, 28,) 0.18/0.215/0.25 MG-35 MCG tablet Take 1 tablet by mouth daily. 28 tablet 2  . ondansetron (ZOFRAN ODT) 4 MG disintegrating tablet Take 1 tablet (4 mg total) by mouth every 8 (eight) hours as needed for nausea or vomiting. (Patient not taking: Reported on 11/10/2020) 20 tablet 0   No current facility-administered medications on file prior to visit.    Allergies  Allergen Reactions  . Penicillins Other (See Comments)    Unknown reaction-childhood allergy  Has patient had a PCN reaction causing immediate rash, facial/tongue/throat swelling, SOB or lightheadedness with hypotension: Unknown Has patient had a PCN reaction causing  severe rash involving mucus membranes or skin necrosis: Unknown Has patient had a PCN reaction that required hospitalization: Unknown Has patient had a PCN reaction occurring within the last 10 years: Unknown If all of the above answers are "NO", then may proceed with Cephalosporin use.     Social History:  reports that she has never smoked. She has never used smokeless tobacco. She reports that she does not drink alcohol and does not use drugs.  Family History  Problem Relation Age of Onset  . Hypertension Mother   . Hypertension Father   . Diabetes Paternal Grandfather     The following  portions of the patient's history were reviewed and updated as appropriate: allergies, current medications, past family history, past medical history, past social history, past surgical history and problem list.  Review of Systems Pertinent items noted in HPI and remainder of comprehensive ROS otherwise negative.  Physical Exam:  BP (!) 148/86   Ht 5\' 7"  (1.702 m)   Wt 249 lb 9.6 oz (113.2 kg)   BMI 39.09 kg/m  CONSTITUTIONAL: Well-developed, well-nourished female in no acute distress.  HENT:  Normocephalic, atraumatic, External right and left ear normal.  EYES: Conjunctivae and EOM are normal. Pupils are equal, round, and reactive to light. No scleral icterus.  NECK: Normal range of motion, supple, no masses.  Normal thyroid.  SKIN: Skin is warm and dry. No rash noted. Not diaphoretic. No erythema. No pallor. MUSCULOSKELETAL: Normal range of motion. No tenderness.  No cyanosis, clubbing, or edema.   NEUROLOGIC: Alert and oriented to person, place, and time. Normal reflexes, muscle tone coordination.  PSYCHIATRIC: Normal mood and affect. Normal behavior. Normal judgment and thought content. CARDIOVASCULAR: Normal heart rate noted, regular rhythm RESPIRATORY: Clear to auscultation bilaterally. Effort and breath sounds normal, no problems with respiration noted. BREASTS: Symmetric in size. No masses, tenderness, skin changes, nipple drainage, or lymphadenopathy bilaterally. Performed in the presence of a chaperone. ABDOMEN: Soft, no distention noted.  No tenderness, rebound or guarding.  PELVIC: Normal appearing external genitalia and urethral meatus; normal appearing vaginal mucosa and cervix with prominent ectropion.  No abnormal discharge noted.  Pap smear obtained.  Normal uterine size, no other palpable masses, no uterine or adnexal tenderness.  Performed in the presence of a chaperone.   Assessment and Plan:      1. History of trichomonal vaginitis Ancillary testing done from pap  smear, will follow up results and manage accordingly. - Cytology - PAP( [facility])  2. Well woman exam with routine gynecological exam - Cytology - PAP( ) - Hepatitis B surface antigen - Hepatitis C antibody - HIV Antibody (routine testing w rflx) - RPR Will follow up results of pap smear and STI screen and manage accordingly. Routine preventative health maintenance measures emphasized. Please refer to After Visit Summary for other counseling recommendations.      , MD, FACOG Obstetrician & Gynecologist, [facility]

## 2020-11-10 NOTE — Patient Instructions (Signed)
Preventive Care 21-24 Years Old, Female Preventive care refers to visits with your health care provider and lifestyle choices that can promote health and wellness. This includes:  A yearly physical exam. This may also be called an annual well check.  Regular dental visits and eye exams.  Immunizations.  Screening for certain conditions.  Healthy lifestyle choices, such as eating a healthy diet, getting regular exercise, not using drugs or products that contain nicotine and tobacco, and limiting alcohol use. What can I expect for my preventive care visit? Physical exam Your health care provider will check your:  Height and weight. This may be used to calculate body mass index (BMI), which tells if you are at a healthy weight.  Heart rate and blood pressure.  Skin for abnormal spots. Counseling Your health care provider may ask you questions about your:  Alcohol, tobacco, and drug use.  Emotional well-being.  Home and relationship well-being.  Sexual activity.  Eating habits.  Work and work environment.  Method of birth control.  Menstrual cycle.  Pregnancy history. What immunizations do I need?  Influenza (flu) vaccine  This is recommended every year. Tetanus, diphtheria, and pertussis (Tdap) vaccine  You may need a Td booster every 10 years. Varicella (chickenpox) vaccine  You may need this if you have not been vaccinated. Human papillomavirus (HPV) vaccine  If recommended by your health care provider, you may need three doses over 6 months. Measles, mumps, and rubella (MMR) vaccine  You may need at least one dose of MMR. You may also need a second dose. Meningococcal conjugate (MenACWY) vaccine  One dose is recommended if you are age 19-21 years and a first-year college student living in a residence hall, or if you have one of several medical conditions. You may also need additional booster doses. Pneumococcal conjugate (PCV13) vaccine  You may need  this if you have certain conditions and were not previously vaccinated. Pneumococcal polysaccharide (PPSV23) vaccine  You may need one or two doses if you smoke cigarettes or if you have certain conditions. Hepatitis A vaccine  You may need this if you have certain conditions or if you travel or work in places where you may be exposed to hepatitis A. Hepatitis B vaccine  You may need this if you have certain conditions or if you travel or work in places where you may be exposed to hepatitis B. Haemophilus influenzae type b (Hib) vaccine  You may need this if you have certain conditions. You may receive vaccines as individual doses or as more than one vaccine together in one shot (combination vaccines). Talk with your health care provider about the risks and benefits of combination vaccines. What tests do I need?  Blood tests  Lipid and cholesterol levels. These may be checked every 5 years starting at age 20.  Hepatitis C test.  Hepatitis B test. Screening  Diabetes screening. This is done by checking your blood sugar (glucose) after you have not eaten for a while (fasting).  Sexually transmitted disease (STD) testing.  BRCA-related cancer screening. This may be done if you have a family history of breast, ovarian, tubal, or peritoneal cancers.  Pelvic exam and Pap test. This may be done every 3 years starting at age 21. Starting at age 30, this may be done every 5 years if you have a Pap test in combination with an HPV test. Talk with your health care provider about your test results, treatment options, and if necessary, the need for more tests.   Follow these instructions at home: Eating and drinking   Eat a diet that includes fresh fruits and vegetables, whole grains, lean protein, and low-fat dairy.  Take vitamin and mineral supplements as recommended by your health care provider.  Do not drink alcohol if: ? Your health care provider tells you not to drink. ? You are  pregnant, may be pregnant, or are planning to become pregnant.  If you drink alcohol: ? Limit how much you have to 0-1 drink a day. ? Be aware of how much alcohol is in your drink. In the [place] one drink equals one 12 oz bottle of beer (355 mL), one 5 oz glass of wine (148 mL), or one 1 oz glass of hard liquor (44 mL). Lifestyle  Take daily care of your teeth and gums.  Stay active. Exercise for at least 30 minutes on 5 or more days each week.  Do not use any products that contain nicotine or tobacco, such as cigarettes, e-cigarettes, and chewing tobacco. If you need help quitting, ask your health care provider.  If you are sexually active, practice safe sex. Use a condom or other form of birth control (contraception) in order to prevent pregnancy and STIs (sexually transmitted infections). If you plan to become pregnant, see your health care provider for a preconception visit. What's next?  Visit your health care provider once a year for a well check visit.  Ask your health care provider how often you should have your eyes and teeth checked.  Stay up to date on all vaccines. This information is not intended to replace advice given to you by your health care provider. Make sure you discuss any questions you have with your health care provider. Document Revised: 07/17/2018 Document Reviewed: 07/17/2018 Elsevier Patient Education  2020 [employer].

## 2020-11-11 LAB — HEPATITIS B SURFACE ANTIGEN: Hepatitis B Surface Ag: NEGATIVE

## 2020-11-11 LAB — HIV ANTIBODY (ROUTINE TESTING W REFLEX): HIV Screen 4th Generation wRfx: NONREACTIVE

## 2020-11-11 LAB — HEPATITIS C ANTIBODY: Hep C Virus Ab: <0.1 s/co ratio (ref 0.0–0.9)

## 2020-11-11 LAB — RPR: RPR Ser Ql: NONREACTIVE

## 2020-11-15 ENCOUNTER — Encounter: Payer: Self-pay | Admitting: Obstetrics & Gynecology

## 2020-11-15 LAB — CYTOLOGY - PAP
Chlamydia: NEGATIVE
Comment: NEGATIVE
Comment: NEGATIVE
Comment: NORMAL
Neisseria Gonorrhea: NEGATIVE
Trichomonas: NEGATIVE

## 2020-11-16 ENCOUNTER — Telehealth: Payer: Self-pay | Admitting: *Deleted

## 2020-11-16 NOTE — Telephone Encounter (Signed)
-----   Message from Tereso Newcomer, MD sent at 11/15/2020  8:24 PM EST ----- Please schedule patient for colposcopy for LGSIL cannot rule out high grade lesion on pap smear done on 11/10/20.  Please call to inform patient of results and need for appointment.

## 2020-11-16 NOTE — Telephone Encounter (Signed)
Pt informed of pap results and the need for a colpo. Pt verbalizes and understands.  

## 2020-12-02 ENCOUNTER — Ambulatory Visit
Admission: RE | Admit: 2020-12-02 | Discharge: 2020-12-02 | Disposition: A | Discharge status: Home / Self Care | Payer: Medicaid Other | Source: Ambulatory Visit | Attending: Nurse Practitioner | Admitting: Nurse Practitioner

## 2020-12-02 DIAGNOSIS — R350 Frequency of micturition: Secondary | ICD-10-CM

## 2020-12-02 DIAGNOSIS — R109 Unspecified abdominal pain: Secondary | ICD-10-CM

## 2020-12-20 ENCOUNTER — Encounter: Payer: Self-pay | Admitting: Obstetrics & Gynecology

## 2020-12-20 ENCOUNTER — Ambulatory Visit (INDEPENDENT_AMBULATORY_CARE_PROVIDER_SITE_OTHER): Payer: Medicaid Other | Admitting: Obstetrics & Gynecology

## 2020-12-20 ENCOUNTER — Other Ambulatory Visit (HOSPITAL_COMMUNITY)
Admission: RE | Admit: 2020-12-20 | Discharge: 2020-12-20 | Disposition: A | Discharge status: Home / Self Care | Payer: Medicaid Other | Source: Ambulatory Visit | Attending: Obstetrics & Gynecology | Admitting: Obstetrics & Gynecology

## 2020-12-20 ENCOUNTER — Other Ambulatory Visit: Payer: Self-pay | Admitting: Obstetrics & Gynecology

## 2020-12-20 ENCOUNTER — Encounter: Payer: Self-pay | Admitting: Radiology

## 2020-12-20 ENCOUNTER — Other Ambulatory Visit: Payer: Self-pay

## 2020-12-20 VITALS — BP 148/90 | HR 81 | Wt 250.0 lb

## 2020-12-20 DIAGNOSIS — N760 Acute vaginitis: Secondary | ICD-10-CM | POA: Diagnosis not present

## 2020-12-20 DIAGNOSIS — Z87440 Personal history of urinary (tract) infections: Secondary | ICD-10-CM

## 2020-12-20 DIAGNOSIS — B9689 Other specified bacterial agents as the cause of diseases classified elsewhere: Secondary | ICD-10-CM

## 2020-12-20 DIAGNOSIS — N949 Unspecified condition associated with female genital organs and menstrual cycle: Secondary | ICD-10-CM | POA: Insufficient documentation

## 2020-12-20 DIAGNOSIS — Z23 Encounter for immunization: Secondary | ICD-10-CM | POA: Diagnosis not present

## 2020-12-20 DIAGNOSIS — R399 Unspecified symptoms and signs involving the genitourinary system: Secondary | ICD-10-CM

## 2020-12-20 DIAGNOSIS — R87612 Low grade squamous intraepithelial lesion on cytologic smear of cervix (LGSIL): Secondary | ICD-10-CM

## 2020-12-20 LAB — POCT URINALYSIS DIPSTICK: Blood, UA: NEGATIVE

## 2020-12-20 LAB — POCT URINE PREGNANCY: Preg Test, Ur: NEGATIVE

## 2020-12-20 MED ORDER — NITROFURANTOIN MONOHYD MACRO 100 MG PO CAPS: 100.0000 mg | ORAL_CAPSULE | Freq: Two times a day (BID) | ORAL | 0 refills | Status: DC

## 2020-12-20 NOTE — Progress Notes (Signed)
    GYNECOLOGY OFFICE COLPOSCOPY PROCEDURE NOTE  25 y.o. [id number] here for colposcopy for low-grade squamous intraepithelial neoplasia cannot rule out high grade dysplasia  pap smear on 11/10/2020. Discussed role for HPV in cervical dysplasia, need for surveillance. Of note, she also reports dysuria, history of frequent UTIs especially after intercourse. Urine dip showed 2+ LE, culture sent. Also has vaginal burning and some discharge, wants vaginitis evaluation.  Patient gave informed written consent, time out was performed.  Placed in lithotomy position. Speculum placed in vagina. Scant clumpy white discharge seen, testing sample obtained. Cervix viewed with colposcope after application of acetic acid.   Colposcopy adequate? Yes Acetowhite lesion noted at 11 o'clock; corresponding biopsy obtained.  ECC specimen obtained. All specimens were labeled and sent to pathology.  Patient was given post procedure instructions.  Will follow up pathology and manage accordingly; patient will be contacted with results and recommendations.  Counseled about HPV vaccine, she received this today, will return in 2 and 6 months for subsequent doses.   Of note, patient was also referred to Urology for history of recurrent UTIs, may need colposcopy or other evaluation. Discussed possible need for antibiotic suppression prior to intercourse, she is considering this.  Macrobid prescribed for current suspected UTI.    Routine preventative health maintenance measures emphasized.  Diagnosis and orders for this encounter: 1. UTI symptoms - Urine Culture - nitrofurantoin, macrocrystal-monohydrate, (MACROBID) 100 MG capsule; Take 1 capsule (100 mg total) by mouth 2 (two) times daily.  Dispense: 10 capsule; Refill: 0  2. Vaginal burning - Cervicovaginal ancillary only  3. History of recurrent UTIs - Ambulatory referral to Urology  4. Low grade squamous intraepithelial lesion (LGSIL) at risk for high grade lesion on  pap smear 11/10/20 - HPV 9-valent vaccine,Recombinat - Surgical pathology  I spent 20 minutes dedicated to the care of this patient including pre-visit review of records, face to face time with the patient discussing her conditions (Diagnoses 1-3 above) and treatments and post visit ordering of testing.   Jaynie Collins, MD, FACOG Obstetrician & Gynecologist, [facility] [employer], [facility]

## 2020-12-20 NOTE — Patient Instructions (Addendum)
Colposcopy, Care After This sheet gives you information about how to care for yourself after your procedure. Your health care provider may also give you more specific instructions. If you have problems or questions, contact your health care provider. What can I expect after the procedure? If you had a colposcopy without a biopsy, you can expect to feel fine right away after your procedure. However, you may have some spotting of blood for a few days. You can return to your normal activities. If you had a colposcopy with a biopsy, it is common after the procedure to have:  Soreness and mild pain. These may last for a few days.  Light-headedness.  Mild vaginal bleeding or discharge that is dark-colored and grainy. This may last for a few days. The discharge may be caused by a liquid (solution) that was used during the procedure. You may need to wear a sanitary pad during this time.  Spotting of blood for at least 48 hours after the procedure. Follow these instructions at home: Medicines  Take over-the-counter and prescription medicines only as told by your health care provider.  Talk with your health care provider about what type of over-the-counter pain medicine and prescription medicine you can start to take again. It is especially important to talk with your health care provider if you take blood thinners. Activity  Limit your physical activity for the first day after your procedure as told by your health care provider.  Avoid using douche products, using tampons, or having sex for at least 3 days after the procedure or for as long as told.  Return to your normal activities as told by your health care provider. Ask your health care provider what activities are safe for you. General instructions  Drink enough fluid to keep your urine pale yellow.  Ask your health care provider if you may take baths, swim, or use a hot tub. You may take showers.  If you use birth control  (contraception), continue to use it.  Keep all follow-up visits as told by your health care provider. This is important.   Contact a health care provider if:  You develop a skin rash. Get help right away if:  You bleed a lot from your vagina or pass blood clots. This includes using more than one sanitary pad each hour for 2 hours in a row.  You have a fever or chills.  You have vaginal discharge that is abnormal, is yellow in color, or smells bad. This could be a sign of infection.  You have severe pain or cramps in your lower abdomen that do not go away with medicine.  You faint. Summary  If you had a colposcopy without a biopsy, you can expect to feel fine right away, but you may have some spotting of blood for a few days. You can return to your normal activities.  If you had a colposcopy with a biopsy, it is common to have mild pain for a few days and spotting for 48 hours after the procedure.  Avoid using douche products, using tampons, and having sex for at least 3 days after the procedure or for as long as told by your health care provider.  Get help right away if you have heavy bleeding, severe pain, or signs of infection. This information is not intended to replace advice given to you by your health care provider. Make sure you discuss any questions you have with your health care provider. Document Revised: 11/04/2019 Document Reviewed: 11/04/2019 Elsevier   Patient Education  2021 Elsevier Inc.  Urinary Tract Infection, Adult  A urinary tract infection (UTI) is an infection of any part of the urinary tract. The urinary tract includes the kidneys, ureters, bladder, and urethra. These organs make, store, and get rid of urine in the body. An upper UTI affects the ureters and kidneys. A lower UTI affects the bladder and urethra. What are the causes? Most urinary tract infections are caused by bacteria in your genital area around your urethra, where urine leaves your body. These  bacteria grow and cause inflammation of your urinary tract. What increases the risk? You are more likely to develop this condition if:  You have a urinary catheter that stays in place.  You are not able to control when you urinate or have a bowel movement (incontinence).  You are female and you: ? Use a spermicide or diaphragm for birth control. ? Have low estrogen levels. ? Are pregnant.  You have certain genes that increase your risk.  You are sexually active.  You take antibiotic medicines.  You have a condition that causes your flow of urine to slow down, such as: ? An enlarged prostate, if you are female. ? Blockage in your urethra. ? A kidney stone. ? A nerve condition that affects your bladder control (neurogenic bladder). ? Not getting enough to drink, or not urinating often.  You have certain medical conditions, such as: ? Diabetes. ? A weak disease-fighting system (immunesystem). ? Sickle cell disease. ? Gout. ? Spinal cord injury. What are the signs or symptoms? Symptoms of this condition include:  Needing to urinate right away (urgency).  Frequent urination. This may include small amounts of urine each time you urinate.  Pain or burning with urination.  Blood in the urine.  Urine that smells bad or unusual.  Trouble urinating.  Cloudy urine.  Vaginal discharge, if you are female.  Pain in the abdomen or the lower back. You may also have:  Vomiting or a decreased appetite.  Confusion.  Irritability or tiredness.  A fever or chills.  Diarrhea. The first symptom in older adults may be confusion. In some cases, they may not have any symptoms until the infection has worsened. How is this diagnosed? This condition is diagnosed based on your medical history and a physical exam. You may also have other tests, including:  Urine tests.  Blood tests.  Tests for STIs (sexually transmitted infections). If you have had more than one UTI, a  cystoscopy or imaging studies may be done to determine the cause of the infections. How is this treated? Treatment for this condition includes:  Antibiotic medicine.  Over-the-counter medicines to treat discomfort.  Drinking enough water to stay hydrated. If you have frequent infections or have other conditions such as a kidney stone, you may need to see a health care provider who specializes in the urinary tract (urologist). In rare cases, urinary tract infections can cause sepsis. Sepsis is a life-threatening condition that occurs when the body responds to an infection. Sepsis is treated in the hospital with IV antibiotics, fluids, and other medicines. Follow these instructions at home: Medicines  Take over-the-counter and prescription medicines only as told by your health care provider.  If you were prescribed an antibiotic medicine, take it as told by your health care provider. Do not stop using the antibiotic even if you start to feel better. General instructions  Make sure you: ? Empty your bladder often and completely. Do not hold urine for long  periods of time. ? Empty your bladder after sex. ? Wipe from front to back after urinating or having a bowel movement if you are female. Use each tissue only one time when you wipe.  Drink enough fluid to keep your urine pale yellow.  Keep all follow-up visits. This is important.   Contact a health care provider if:  Your symptoms do not get better after 1-2 days.  Your symptoms go away and then return. Get help right away if:  You have severe pain in your back or your lower abdomen.  You have a fever or chills.  You have nausea or vomiting. Summary  A urinary tract infection (UTI) is an infection of any part of the urinary tract, which includes the kidneys, ureters, bladder, and urethra.  Most urinary tract infections are caused by bacteria in your genital area.  Treatment for this condition often includes antibiotic  medicines.  If you were prescribed an antibiotic medicine, take it as told by your health care provider. Do not stop using the antibiotic even if you start to feel better.  Keep all follow-up visits. This is important. This information is not intended to replace advice given to you by your health care provider. Make sure you discuss any questions you have with your health care provider. Document Revised: 06/17/2020 Document Reviewed: 06/17/2020 Elsevier Patient Education  2021 [employer].

## 2020-12-22 ENCOUNTER — Encounter: Payer: Self-pay | Admitting: Obstetrics & Gynecology

## 2020-12-22 DIAGNOSIS — N870 Mild cervical dysplasia: Secondary | ICD-10-CM | POA: Insufficient documentation

## 2020-12-22 LAB — URINE CULTURE

## 2020-12-22 LAB — CERVICOVAGINAL ANCILLARY ONLY
Bacterial Vaginitis (gardnerella): POSITIVE — AB
Candida Glabrata: NEGATIVE
Candida Vaginitis: NEGATIVE
Chlamydia: NEGATIVE
Comment: NEGATIVE
Comment: NEGATIVE
Comment: NEGATIVE
Comment: NEGATIVE
Comment: NEGATIVE
Comment: NORMAL
Neisseria Gonorrhea: NEGATIVE
Trichomonas: NEGATIVE

## 2020-12-22 MED ORDER — METRONIDAZOLE 500 MG PO TABS: 500.0000 mg | ORAL_TABLET | Freq: Two times a day (BID) | ORAL | 0 refills | Status: AC

## 2020-12-22 NOTE — Addendum Note (Signed)
Addended by: Jaynie Collins A on: 12/22/2020 03:59 PM   Modules accepted: Orders

## 2021-01-23 ENCOUNTER — Ambulatory Visit: Payer: Medicaid Other | Admitting: Urology

## 2021-01-24 ENCOUNTER — Encounter: Payer: Self-pay | Admitting: Urology

## 2021-02-14 ENCOUNTER — Ambulatory Visit: Payer: Medicaid Other

## 2021-04-14 ENCOUNTER — Telehealth: Payer: Self-pay | Admitting: *Deleted

## 2021-04-14 MED ORDER — VALACYCLOVIR HCL 1 G PO TABS: 1000.0000 mg | ORAL_TABLET | Freq: Every day | ORAL | 2 refills | Status: DC

## 2021-04-14 NOTE — Telephone Encounter (Signed)
Pt called stating she is having a HSV breakout and asked if we could send in medication. Will send in Valtrex to [facility].

## 2021-05-31 ENCOUNTER — Telehealth: Payer: Self-pay

## 2021-05-31 NOTE — Telephone Encounter (Signed)
Pt called requesting refill on Valtrex for HSV outbreak. Informed pt medication had 2 refills left. Pt states she has been having more frequent outbreaks. Advised pt to let Dr. Macon Large know at her annual on 08/01 and discuss starting something for suppression.  Pt verbalizes understanding.

## 2021-06-19 ENCOUNTER — Other Ambulatory Visit: Payer: Self-pay

## 2021-06-19 ENCOUNTER — Other Ambulatory Visit (HOSPITAL_COMMUNITY)
Admission: RE | Admit: 2021-06-19 | Discharge: 2021-06-19 | Disposition: A | Discharge status: Home / Self Care | Payer: Medicaid Other | Source: Ambulatory Visit | Attending: Obstetrics & Gynecology | Admitting: Obstetrics & Gynecology

## 2021-06-19 ENCOUNTER — Ambulatory Visit (INDEPENDENT_AMBULATORY_CARE_PROVIDER_SITE_OTHER): Payer: Medicaid Other | Admitting: Obstetrics & Gynecology

## 2021-06-19 ENCOUNTER — Encounter: Payer: Self-pay | Admitting: Obstetrics & Gynecology

## 2021-06-19 ENCOUNTER — Encounter: Payer: Self-pay | Admitting: Radiology

## 2021-06-19 VITALS — BP 121/83 | HR 83 | Ht 67.0 in | Wt 268.2 lb

## 2021-06-19 DIAGNOSIS — Z113 Encounter for screening for infections with a predominantly sexual mode of transmission: Secondary | ICD-10-CM | POA: Insufficient documentation

## 2021-06-19 DIAGNOSIS — N926 Irregular menstruation, unspecified: Secondary | ICD-10-CM

## 2021-06-19 DIAGNOSIS — A6004 Herpesviral vulvovaginitis: Secondary | ICD-10-CM

## 2021-06-19 LAB — POCT URINE PREGNANCY: Preg Test, Ur: NEGATIVE

## 2021-06-19 MED ORDER — VALACYCLOVIR HCL 500 MG PO TABS: 500.0000 mg | ORAL_TABLET | Freq: Every day | ORAL | 5 refills | Status: DC

## 2021-06-19 MED ORDER — NORETHIN ACE-ETH ESTRAD-FE 1-20 MG-MCG PO TABS: 1.0000 | ORAL_TABLET | Freq: Every day | ORAL | 11 refills | Status: DC

## 2021-06-19 MED ORDER — NAPROXEN 250 MG PO TABS: 500.0000 mg | ORAL_TABLET | Freq: Two times a day (BID) | ORAL | 3 refills | Status: DC

## 2021-06-19 NOTE — Progress Notes (Signed)
GYNECOLOGY OFFICE VISIT NOTE  History:   Tiffany Sanchez is a 25 y.o. [phone] here today for evaluation of irregular menstrual periods. Reports having cycles about 13 days apart for the past three months. Has used OCPs and IUD in the past. Desires STI screen and Valtrex refill for suppression, reports recent frequent outbreaks due to stress.  She denies any abnormal vaginal discharge, pelvic pain or other concerns.    Past Medical History:  Diagnosis Date   Anemia    Asthma    no hospitalizations, no recent attacks (since childhood)   Cervical dysplasia    Complication of anesthesia    one sided epidural   Genital herpes 06/03/2018   History of gestational hypertension    Low grade squamous intraepithelial lesion (LGSIL) at risk for high grade squamous intraepithelial lesion (HGSIL) on cytologic smear of cervix 11/10/2020   Given concern about high grade lesion, colposcopy recommended. Of note, also had LGSIL pap in 2018 ([facility] results), but had normal pap in 2019.    Ovarian cyst    Trichomonal vaginitis 04/20/2020   Late may 2021   UTI (urinary tract infection)     Past Surgical History:  Procedure Laterality Date   CESAREAN SECTION N/A 06/28/2018   Procedure: CESAREAN SECTION;  Surgeon: Levie Heritage, DO;  Location: [facility];  Service: Obstetrics;  Laterality: N/A;   THERAPEUTIC ABORTION      The following portions of the patient's history were reviewed and updated as appropriate: allergies, current medications, past family history, past medical history, past social history, past surgical history and problem list.   Health Maintenance:  LGSIL pap and negative HRHPV on 11/10/2020.    Review of Systems:  Pertinent items noted in HPI and remainder of comprehensive ROS otherwise negative.  Physical Exam:  BP 121/83   Pulse 83   Ht 5\' 7"  (1.702 m)   Wt 268 lb 3.2 oz (121.7 kg)   BMI 42.01 kg/m  CONSTITUTIONAL: Well-developed, well-nourished female in no  acute distress.  HEENT:  Normocephalic, atraumatic. External right and left ear normal. No scleral icterus.  NECK: Normal range of motion, supple, no masses noted on observation SKIN: No rash noted. Not diaphoretic. No erythema. No pallor. MUSCULOSKELETAL: Normal range of motion. No edema noted. NEUROLOGIC: Alert and oriented to person, place, and time. Normal muscle tone coordination. No cranial nerve deficit noted. PSYCHIATRIC: Normal mood and affect. Normal behavior. Normal judgment and thought content. CARDIOVASCULAR: Normal heart rate noted RESPIRATORY: Effort and breath sounds normal, no problems with respiration noted ABDOMEN: No masses noted. No other overt distention noted.   PELVIC: Deferred  Labs and Imaging Results for orders placed or performed in visit on 06/19/21 (from the past 168 hour(s))  POCT urine pregnancy   Collection Time: 06/19/21  1:51 PM  Result Value Ref Range   Preg Test, Ur Negative Negative   No results found.    Assessment and Plan:     1. Irregular menstrual bleeding Patient has abnormal uterine bleeding.  Will order abnormal uterine bleeding evaluation labs. Had ultrasound in 06/2020, no anomalies, will defer this for now.  Patient willing to retry Junel to help with bleeding, will monitor effect. Could also be due to recent stressors, will monitor AUB. - POCT urine pregnancy - CBC - TSH - norethindrone-ethinyl estradiol-FE (JUNEL FE 1/20) 1-20 MG-MCG tablet; Take 1 tablet by mouth daily.  Dispense: 28 tablet; Refill: 11 - naproxen (NAPROSYN) 250 MG tablet; Take 2 tablets (500 mg  total) by mouth 2 (two) times daily with a meal. Take during period or as needed  Dispense: 60 tablet; Refill: 3 - Cervicovaginal ancillary only - Hemoglobin A1c  2. Herpes simplex vulvovaginitis Valtrex prescribed as per her request. - valACYclovir (VALTREX) 500 MG tablet; Take 1 tablet (500 mg total) by mouth daily. Can increase to twice a day for 5 days in the event of a  recurrence  Dispense: 90 tablet; Refill: 5  3. Routine screening for STI (sexually transmitted infection) STI screen done as per her request, will follow up results and manage accordingly. - Cervicovaginal ancillary only (self swab) - RPR+HBsAg+HCVAb+HIV  Routine preventative health maintenance measures emphasized. Please refer to After Visit Summary for other counseling recommendations.   Return in about 5 months (around 11/10/2021) for Follow up pap.    I spent 15 minutes dedicated to the care of this patient including pre-visit review of records, face to face time with the patient discussing her conditions and treatments and post visit orders.    Jaynie Collins, MD, FACOG Obstetrician & Gynecologist, [facility] [employer], [facility]

## 2021-06-20 LAB — CERVICOVAGINAL ANCILLARY ONLY
Bacterial Vaginitis (gardnerella): NEGATIVE
Candida Glabrata: NEGATIVE
Candida Vaginitis: NEGATIVE
Chlamydia: NEGATIVE
Comment: NEGATIVE
Comment: NEGATIVE
Comment: NEGATIVE
Comment: NEGATIVE
Comment: NEGATIVE
Comment: NORMAL
Neisseria Gonorrhea: NEGATIVE
Trichomonas: NEGATIVE

## 2021-06-20 LAB — CBC
Hematocrit: 32.1 % — ABNORMAL LOW (ref 34.0–46.6)
Hemoglobin: 10.1 g/dL — ABNORMAL LOW (ref 11.1–15.9)
MCH: 24.3 pg — ABNORMAL LOW (ref 26.6–33.0)
MCHC: 31.5 g/dL (ref 31.5–35.7)
MCV: 77 fL — ABNORMAL LOW (ref 79–97)
Platelets: 379 10*3/uL (ref 150–450)
RBC: 4.15 x10E6/uL (ref 3.77–5.28)
RDW: 13.5 % (ref 11.7–15.4)
WBC: 8.8 10*3/uL (ref 3.4–10.8)

## 2021-06-20 LAB — RPR+HBSAG+HCVAB+...
HIV Screen 4th Generation wRfx: NONREACTIVE
Hep C Virus Ab: <0.1 s/co ratio (ref 0.0–0.9)
Hepatitis B Surface Ag: NEGATIVE
RPR Ser Ql: NONREACTIVE

## 2021-06-20 LAB — HEMOGLOBIN A1C
Est. average glucose Bld gHb Est-mCnc: 103 mg/dL
Hgb A1c MFr Bld: 5.2 % (ref 4.8–5.6)

## 2021-06-20 LAB — TSH: TSH: 0.734 u[IU]/mL (ref 0.450–4.500)

## 2021-09-21 ENCOUNTER — Ambulatory Visit (INDEPENDENT_AMBULATORY_CARE_PROVIDER_SITE_OTHER): Payer: Medicaid Other

## 2021-09-21 ENCOUNTER — Other Ambulatory Visit (HOSPITAL_COMMUNITY)
Admission: RE | Admit: 2021-09-21 | Discharge: 2021-09-21 | Disposition: A | Discharge status: Home / Self Care | Payer: Medicaid Other | Source: Ambulatory Visit | Attending: Obstetrics & Gynecology | Admitting: Obstetrics & Gynecology

## 2021-09-21 ENCOUNTER — Other Ambulatory Visit: Payer: Self-pay

## 2021-09-21 VITALS — BP 116/88 | HR 98 | Wt 270.0 lb

## 2021-09-21 DIAGNOSIS — R300 Dysuria: Secondary | ICD-10-CM | POA: Diagnosis not present

## 2021-09-21 DIAGNOSIS — N898 Other specified noninflammatory disorders of vagina: Secondary | ICD-10-CM | POA: Diagnosis present

## 2021-09-21 LAB — POCT URINALYSIS DIPSTICK
Bilirubin, UA: NEGATIVE
Glucose, UA: NEGATIVE
Ketones, UA: NEGATIVE
Nitrite, UA: NEGATIVE
Protein, UA: NEGATIVE
Spec Grav, UA: 1.025 (ref 1.010–1.025)
Urobilinogen, UA: 0.2 E.U./dL
pH, UA: 6 (ref 5.0–8.0)

## 2021-09-21 NOTE — Progress Notes (Signed)
Patient was assessed and managed by nursing staff during this encounter. I have reviewed the chart and agree with the documentation and plan.   Jaynie Collins, MD 09/21/2021 9:37 AM

## 2021-09-21 NOTE — Progress Notes (Signed)
SUBJECTIVE:  25 y.o. female complains of  vaginal discharge/concerned may have UTI notes burning with urination ,dysuria and back pain  1 week(s). Denies abnormal vaginal bleeding or significant pelvic pain or fever. Notes UTI symptoms. Denies history of known exposure to STD.  No LMP recorded. (Menstrual status: Irregular Periods).  OBJECTIVE:  She appears well, afebrile. Urine dipstick: positive for RBC's. Positive for Leukocytes   ASSESSMENT:  Vaginal Discharge  Dysuria     PLAN:  UA pt states she will wait on cx results for Treatment if needed.  Urine Cx GC, chlamydia, trichomonas, BVAG, CVAG probe sent to lab. Treatment: To be determined once lab results are received ROV prn if symptoms persist or worsen.

## 2021-09-22 LAB — CERVICOVAGINAL ANCILLARY ONLY
Bacterial Vaginitis (gardnerella): POSITIVE — AB
Candida Glabrata: NEGATIVE
Candida Vaginitis: NEGATIVE
Chlamydia: NEGATIVE
Comment: NEGATIVE
Comment: NEGATIVE
Comment: NEGATIVE
Comment: NEGATIVE
Comment: NEGATIVE
Comment: NORMAL
Neisseria Gonorrhea: NEGATIVE
Trichomonas: NEGATIVE

## 2021-09-25 ENCOUNTER — Other Ambulatory Visit: Payer: Self-pay

## 2021-09-25 DIAGNOSIS — B9689 Other specified bacterial agents as the cause of diseases classified elsewhere: Secondary | ICD-10-CM

## 2021-09-25 MED ORDER — METRONIDAZOLE 500 MG PO TABS: 500.0000 mg | ORAL_TABLET | Freq: Two times a day (BID) | ORAL | 0 refills | Status: DC

## 2021-09-25 NOTE — Progress Notes (Signed)
Pt saw results in Mychart requesting Rx be sent Rx sent in for BV per protocol. Message sent to pt in Mychart.

## 2021-09-26 LAB — URINE CULTURE

## 2021-09-28 ENCOUNTER — Encounter: Payer: Self-pay | Admitting: Radiology

## 2021-10-09 ENCOUNTER — Other Ambulatory Visit: Payer: Self-pay | Admitting: *Deleted

## 2021-10-09 MED ORDER — TRANEXAMIC ACID 650 MG PO TABS: 1300.0000 mg | ORAL_TABLET | Freq: Three times a day (TID) | ORAL | 2 refills | Status: DC

## 2021-11-04 ENCOUNTER — Emergency Department (HOSPITAL_COMMUNITY): Payer: Medicaid Other

## 2021-11-04 ENCOUNTER — Other Ambulatory Visit: Payer: Self-pay

## 2021-11-04 ENCOUNTER — Encounter (HOSPITAL_COMMUNITY): Payer: Self-pay

## 2021-11-04 ENCOUNTER — Emergency Department (HOSPITAL_COMMUNITY)
Admission: EM | Admit: 2021-11-04 | Discharge: 2021-11-05 | Disposition: A | Discharge status: Home / Self Care | Payer: Medicaid Other | Attending: Emergency Medicine | Admitting: Emergency Medicine

## 2021-11-04 DIAGNOSIS — N9489 Other specified conditions associated with female genital organs and menstrual cycle: Secondary | ICD-10-CM | POA: Diagnosis not present

## 2021-11-04 DIAGNOSIS — R0789 Other chest pain: Secondary | ICD-10-CM | POA: Insufficient documentation

## 2021-11-04 DIAGNOSIS — Z7951 Long term (current) use of inhaled steroids: Secondary | ICD-10-CM | POA: Diagnosis not present

## 2021-11-04 DIAGNOSIS — Z79899 Other long term (current) drug therapy: Secondary | ICD-10-CM | POA: Diagnosis not present

## 2021-11-04 DIAGNOSIS — R0602 Shortness of breath: Secondary | ICD-10-CM | POA: Diagnosis not present

## 2021-11-04 DIAGNOSIS — I10 Essential (primary) hypertension: Secondary | ICD-10-CM | POA: Insufficient documentation

## 2021-11-04 DIAGNOSIS — J45909 Unspecified asthma, uncomplicated: Secondary | ICD-10-CM | POA: Diagnosis not present

## 2021-11-04 LAB — CBC
HCT: 37.8 % (ref 36.0–46.0)
Hemoglobin: 11.2 g/dL — ABNORMAL LOW (ref 12.0–15.0)
MCH: 24.5 pg — ABNORMAL LOW (ref 26.0–34.0)
MCHC: 29.6 g/dL — ABNORMAL LOW (ref 30.0–36.0)
MCV: 82.7 fL (ref 80.0–100.0)
Platelets: 325 10*3/uL (ref 150–400)
RBC: 4.57 MIL/uL (ref 3.87–5.11)
RDW: 15.8 % — ABNORMAL HIGH (ref 11.5–15.5)
WBC: 9.7 10*3/uL (ref 4.0–10.5)
nRBC: 0 % (ref 0.0–0.2)

## 2021-11-04 LAB — I-STAT BETA HCG BLOOD, ED (MC, WL, AP ONLY): I-stat hCG, quantitative: <5 m[IU]/mL (ref <5)

## 2021-11-04 LAB — BASIC METABOLIC PANEL
Anion gap: 7 (ref 5–15)
BUN: 14 mg/dL (ref 6–20)
CO2: 26 mmol/L (ref 22–32)
Calcium: 9.3 mg/dL (ref 8.9–10.3)
Chloride: 103 mmol/L (ref 98–111)
Creatinine, Ser: 0.77 mg/dL (ref 0.44–1.00)
GFR, Estimated: >60 mL/min (ref >60)
Glucose, Bld: 80 mg/dL (ref 70–99)
Potassium: 3.4 mmol/L — ABNORMAL LOW (ref 3.5–5.1)
Sodium: 136 mmol/L (ref 135–145)

## 2021-11-04 LAB — D-DIMER, QUANTITATIVE: D-Dimer, Quant: 0.68 ug/mL-FEU — ABNORMAL HIGH (ref 0.00–0.50)

## 2021-11-04 LAB — TROPONIN I (HIGH SENSITIVITY)
Troponin I (High Sensitivity): <2 ng/L (ref <18)
Troponin I (High Sensitivity): <2 ng/L (ref <18)

## 2021-11-04 MED ORDER — ALUM & MAG HYDROXIDE-SIMETH 200-200-20 MG/5ML PO SUSP
30.0000 mL | Freq: Once | ORAL | Status: AC
  Administered 2021-11-04: 30 mL via ORAL
  Filled 2021-11-04: qty 30

## 2021-11-04 MED ORDER — LIDOCAINE VISCOUS HCL 2 % MT SOLN
15.0000 mL | Freq: Once | OROMUCOSAL | Status: AC
  Administered 2021-11-04: 15 mL via ORAL
  Filled 2021-11-04: qty 15

## 2021-11-04 MED ORDER — IOHEXOL 350 MG/ML SOLN
57.0000 mL | Freq: Once | INTRAVENOUS | Status: AC | PRN
  Administered 2021-11-04: 57 mL via INTRAVENOUS

## 2021-11-04 NOTE — ED Provider Notes (Signed)
[facility] EMERGENCY DEPARTMENT Provider Note   CSN: [id number] Arrival date & time: 11/04/21  1730     History Chief Complaint  Patient presents with   Chest Pain    Tiffany Sanchez is a 25 y.o. female.  The history is provided by the patient. No language interpreter was used.  Chest Pain  25 year old female significant history of asthma, hypertension, who presents for evaluation of chest pain.  Patient states for the past week she has noticed recurrent pain in her chest.  She described pain as a pressure sensation across her chest with some associated shortness of breath.  Pain is brought on by exertion and she noticed that her heart rate would be fast in the 150s these episodes happen while she exercised on the treadmill.  She also endorsed having some heartburn in which he take Tums with some improvement.  His pain felt a bit different.  No associated fever runny nose sneezing coughing sore throat nausea vomiting diarrhea diaphoresis abdominal pain or low back pain.  She denies any significant cardiac history.  She takes oral birth control intermittently.  She denies any prior history of PE or DVT.  Does have history of asthma but denies any wheezing.  She endorsed mild chest pain at this time.  Past Medical History:  Diagnosis Date   Anemia    Asthma    no hospitalizations, no recent attacks (since childhood)   Cervical dysplasia    Complication of anesthesia    one sided epidural   Genital herpes 06/03/2018   History of gestational hypertension    Low grade squamous intraepithelial lesion (LGSIL) at risk for high grade squamous intraepithelial lesion (HGSIL) on cytologic smear of cervix 11/10/2020   Given concern about high grade lesion, colposcopy recommended. Of note, also had LGSIL pap in 2018 (CareEverywhere results), but had normal pap in 2019.    Ovarian cyst    Trichomonal vaginitis 04/20/2020   Late may 2021   UTI (urinary tract infection)      Patient Active Problem List   Diagnosis Date Noted   Dysplasia of cervix, low grade (CIN 1) 12/22/2020   Low grade squamous intraepithelial lesion (LGSIL) at risk for high grade lesion on pap smear 11/10/20 11/10/2020   Family history of breast cancer 10/22/2019   BMI 50.0-59.9, adult (HCC) 06/03/2018   Genital herpes 06/03/2018    Past Surgical History:  Procedure Laterality Date   CESAREAN SECTION N/A 06/28/2018   Procedure: CESAREAN SECTION;  Surgeon: Levie Heritage, DO;  Location: [facility];  Service: Obstetrics;  Laterality: N/A;   THERAPEUTIC ABORTION       OB History     Gravida  4   Para  2   Term  2   Preterm      AB  2   Living  2      SAB      IAB  1   Ectopic      Multiple  0   Live Births  2           Family History  Problem Relation Age of Onset   Hypertension Mother    Hypertension Father    Diabetes Paternal Grandfather     Social History   Tobacco Use   Smoking status: Never   Smokeless tobacco: Never  Vaping Use   Vaping Use: Never used  Substance Use Topics   Alcohol use: No   Drug use: No  Home Medications Prior to Admission medications   Medication Sig Start Date End Date Taking? Authorizing Provider  albuterol (VENTOLIN HFA) 108 (90 Base) MCG/ACT inhaler Inhale 2 puffs into the lungs every 4 (four) hours as needed. 04/22/20   Fisher, Roselyn Bering, PA-C  cetirizine (ZYRTEC) 10 MG tablet Take 10 mg by mouth daily.    [provider]  escitalopram (LEXAPRO) 10 MG tablet Take 10 mg by mouth daily.    [provider]  fluticasone (FLONASE) 50 MCG/ACT nasal spray Place 1 spray into both nostrils daily.    [provider]  hydrochlorothiazide (HYDRODIURIL) 25 MG tablet Take 25 mg by mouth daily.    [provider]  metroNIDAZOLE (FLAGYL) 500 MG tablet Take 1 tablet (500 mg total) by mouth 2 (two) times daily. 09/25/21   Tereso Newcomer, MD  Multiple Vitamin (MULTIVITAMIN WITH  MINERALS) TABS tablet Take 1 tablet by mouth daily.    [provider]  Multiple Vitamins-Minerals (HAIR SKIN AND NAILS FORMULA PO) Take 1 tablet by mouth daily.    [provider]  naproxen (NAPROSYN) 250 MG tablet Take 2 tablets (500 mg total) by mouth 2 (two) times daily with a meal. Take during period or as needed 06/19/21   Anyanwu, Jethro Bastos, MD  nitrofurantoin, macrocrystal-monohydrate, (MACROBID) 100 MG capsule Take 1 capsule (100 mg total) by mouth 2 (two) times daily. Patient not taking: Reported on 06/19/2021 12/20/20   Tereso Newcomer, MD  norethindrone-ethinyl estradiol-FE (JUNEL FE 1/20) 1-20 MG-MCG tablet Take 1 tablet by mouth daily. 06/19/21   Anyanwu, Jethro Bastos, MD  ondansetron (ZOFRAN ODT) 4 MG disintegrating tablet Take 1 tablet (4 mg total) by mouth every 8 (eight) hours as needed for nausea or vomiting. Patient not taking: Reported on 11/10/2020 03/19/20   Shaune Pollack, MD  phentermine 37.5 MG capsule Take 37.5 mg by mouth every morning. Patient not taking: Reported on 06/19/2021    [provider]  tranexamic acid (LYSTEDA) 650 MG TABS tablet Take 2 tablets (1,300 mg total) by mouth 3 (three) times daily. Take during menses for a maximum of five days 10/09/21   Anyanwu, Jethro Bastos, MD  valACYclovir (VALTREX) 500 MG tablet Take 1 tablet (500 mg total) by mouth daily. Can increase to twice a day for 5 days in the event of a recurrence 06/19/21   Anyanwu, Jethro Bastos, MD    Allergies    Penicillins  Review of Systems   Review of Systems  Cardiovascular:  Positive for chest pain.  All other systems reviewed and are negative.  Physical Exam Updated Vital Signs BP 132/78 (BP Location: Left Arm)    Pulse 96    Temp 98.8 F (37.1 C) (Oral)    Resp 18    Ht  (1.702 m)    Wt 121.1 kg    SpO2 100%    BMI 41.82 kg/m   Physical Exam Vitals and nursing note reviewed.  Constitutional:      General: She is not in acute distress.    Appearance: She is  well-developed.  HENT:     Head: Atraumatic.  Eyes:     Conjunctiva/sclera: Conjunctivae normal.  Cardiovascular:     Rate and Rhythm: Normal rate and regular rhythm.  Pulmonary:     Effort: Pulmonary effort is normal.     Breath sounds: No wheezing, rhonchi or rales.  Abdominal:     General: Abdomen is flat.     Tenderness: There is no abdominal  tenderness.  Musculoskeletal:     Cervical back: Neck supple.     Right lower leg: No edema.     Left lower leg: No edema.  Skin:    Findings: No rash.  Neurological:     Mental Status: She is alert and oriented to person, place, and time.  Psychiatric:        Mood and Affect: Mood normal.    ED Results / Procedures / Treatments   Labs (all labs ordered are listed, but only abnormal results are displayed) Labs Reviewed  BASIC METABOLIC PANEL - Abnormal; Notable for the following components:      Result Value   Potassium 3.4 (*)    All other components within normal limits  CBC - Abnormal; Notable for the following components:   Hemoglobin 11.2 (*)    MCH 24.5 (*)    MCHC 29.6 (*)    RDW 15.8 (*)    All other components within normal limits  D-DIMER, QUANTITATIVE - Abnormal; Notable for the following components:   D-Dimer, Quant 0.68 (*)    All other components within normal limits  I-STAT BETA HCG BLOOD, ED ([facility], [facility], [facility] ONLY)  TROPONIN I (HIGH SENSITIVITY)  TROPONIN I (HIGH SENSITIVITY)    EKG None ED ECG REPORT   Date: 11/04/2021  Rate: 85  Rhythm: normal sinus rhythm  QRS Axis: normal  Intervals: normal  ST/T Wave abnormalities: nonspecific T wave changes  Conduction Disutrbances:none  Narrative Interpretation:   Old EKG Reviewed: unchanged  I have personally reviewed the EKG tracing and agree with the computerized printout as noted.   Radiology DG Chest 2 View  Result Date: 11/04/2021 CLINICAL DATA:  Chest pain. EXAM: CHEST - 2 VIEW COMPARISON:  Chest x-ray 04/22/2020. FINDINGS: The heart size and  mediastinal contours are within normal limits. Both lungs are clear. The visualized skeletal structures are unremarkable. IMPRESSION: No active cardiopulmonary disease. Electronically Signed   By: Darliss Cheney M.D.   On: 11/04/2021 18:20   CT Angio Chest PE W and/or Wo Contrast  Result Date: 11/04/2021 CLINICAL DATA:  Elevated D-dimer. EXAM: CT ANGIOGRAPHY CHEST WITH CONTRAST TECHNIQUE: Multidetector CT imaging of the chest was performed using the standard protocol during bolus administration of intravenous contrast. Multiplanar CT image reconstructions and MIPs were obtained to evaluate the vascular anatomy. CONTRAST:  89mL OMNIPAQUE IOHEXOL 350 MG/ML SOLN COMPARISON:  None. FINDINGS: Cardiovascular: Satisfactory opacification of the pulmonary arteries to the segmental level. No evidence of pulmonary embolism. Normal heart size. No pericardial effusion. Mediastinum/Nodes: No enlarged mediastinal, hilar, or axillary lymph nodes. Thyroid gland, trachea, and esophagus demonstrate no significant findings. Lungs/Pleura: Lungs are clear. No pleural effusion or pneumothorax. Upper Abdomen: No acute abnormality. Musculoskeletal: No chest wall abnormality. No acute or significant osseous findings. Review of the MIP images confirms the above findings. IMPRESSION: Negative examination for pulmonary embolism or acute cardiopulmonary disease. Electronically Signed   By: Aram Candela M.D.   On: 11/04/2021 23:00    Procedures Procedures   Medications Ordered in ED Medications  alum & mag hydroxide-simeth (MAALOX/MYLANTA) 200-200-20 MG/5ML suspension 30 mL (30 mLs Oral Given 11/04/21 2023)    And  lidocaine (XYLOCAINE) 2 % viscous mouth solution 15 mL (15 mLs Oral Given 11/04/21 2023)  iohexol (OMNIPAQUE) 350 MG/ML injection 57 mL (57 mLs Intravenous Contrast Given 11/04/21 2249)    ED Course  I have reviewed the triage vital signs and the nursing notes.  Pertinent labs & imaging results that were  available  during my care of the patient were reviewed by me and considered in my medical decision making (see chart for details).    MDM Rules/Calculators/A&P                         BP 110/66    Pulse 84    Temp 98.8 F (37.1 C) (Oral)    Resp 17    Ht 5\' 7"  (1.702 m)    Wt 121.1 kg    LMP 10/30/2021 (Approximate)    SpO2 98%    BMI 41.82 kg/m      Final Clinical Impression(s) / ED Diagnoses Final diagnoses:  Atypical chest pain  Shortness of breath    Rx / DC Orders ED Discharge Orders     None      6:24 PM Patient here with ongoing chest pain for the past week worse with exertion.  She also endorsing shortness of breath and test endorsed taking oral birth control pill on occasion.  Will obtain D-dimer, cardiac work-up initiated, patient overall well-appearing  9:22 PM D-dimer is elevated at 0.68.  Will obtain chest CTA to r/o PE. HEAR score of 3, low risk of ACS.   11:11 PM Chest CTA neg for PE or acute pulmonary disease.  At this time recommend patient to follow-up outpatient for further evaluation of her symptoms.  She is stable for discharge.  Return precaution given.   14/10/2021, PA-C 11/04/21 2328    11/06/21, DO 11/04/21 2344

## 2021-11-04 NOTE — ED Provider Notes (Signed)
Emergency Medicine Provider Triage Evaluation Note  RAPHAEL ESPE , a 25 y.o. female  was evaluated in triage.  Pt complains of chest pain.  Pt reports she was working out on Tuesday and her heart rate got over 150 and she began having chest pain.  Pt reports she had an ekg by the doctor she works with and was told it was abnormal   Review of Systems  Positive: shortness of breath Negative: No cough or fever  Physical Exam  BP 132/78 (BP Location: Left Arm)    Pulse 96    Temp 98.8 F (37.1 C) (Oral)    Resp 18    Ht 5\' 7"  (1.702 m)    Wt 121.1 kg    SpO2 100%    BMI 41.82 kg/m  Gen:   Awake, no distress   Resp:  Normal effort  MSK:   Moves extremities without difficulty  Other:    Medical Decision Making  Medically screening exam initiated at 5:48 PM.  Appropriate orders placed.  was informed that the remainder of the evaluation will be completed by another provider, this initial triage assessment does not replace that evaluation, and the importance of remaining in the ED until their evaluation is complete.     [name], Elson Areas 11/04/21 1751    11/06/21, MD 11/08/21 1324

## 2021-11-04 NOTE — ED Triage Notes (Signed)
Pt arrived POV c/o CP since Tuesday that comes and goes. Pt states she first noticed it when she was working out Tuesday and she does have a hx of asthma, but now she feels like she is [facility] and has sharp pains in her upper back. Pt states she also has a headache.

## 2021-11-04 NOTE — Discharge Instructions (Signed)
Please follow up closely with your doctor for further care.  Your labs and CT scan are normal today.

## 2021-11-21 ENCOUNTER — Ambulatory Visit: Payer: Medicaid Other | Admitting: Obstetrics and Gynecology

## 2021-11-30 ENCOUNTER — Telehealth: Payer: Self-pay | Admitting: Advanced Practice Midwife

## 2021-11-30 ENCOUNTER — Other Ambulatory Visit: Payer: Self-pay | Admitting: *Deleted

## 2021-11-30 ENCOUNTER — Ambulatory Visit: Payer: Medicaid Other | Admitting: Advanced Practice Midwife

## 2021-11-30 DIAGNOSIS — N926 Irregular menstruation, unspecified: Secondary | ICD-10-CM

## 2021-11-30 NOTE — Telephone Encounter (Signed)
Patient called at home. Identity confirmed x 2. Patient is on my schedule at 1410 today for IUD placement. Discussed with patient that when she was evaluated by Dr. Shawnie Pons for second lost IUD on 08/03/2020 it was determined that she is not an ideal candidate for additional IUD placement. I offered to cancel today's appointment due to my discomfort with placing IUD or to bring patient in for discussion of alternative contraception options. Patient verbalizes she would still like to be seen for discussion of other methods.  Clayton Bibles, MSA, MSN, CNM Certified Nurse Midwife, [facility] for [employer], [facility] 11/30/21 10:59 AM

## 2021-11-30 NOTE — Telephone Encounter (Signed)
Patient called the office requesting to speak to me. She endorses recurrent history of long periods, lasting up to 10 days. She reports changing her pad 3-4 times each hour. She has previously managed this complaint with Lysteda. She is hesitant to continue this intervention as she does not like to take pills. She also reports having an active prescription for OCPs for this complaint but states they make her feel nauseated. Patient voices concern that she has worsening endometriosis.   Discussed with patient we can order a pelvic ultrasound and schedule an appointment with Dr. Macon Large for ongoing discussion of treatment options. Patient verbalizes to CNM she would like to do this and would like to cancel today's appointment for contraception discussion. Changes made accordingly.   Clayton Bibles, MSA, MSN, CNM Certified Nurse Midwife, Biochemist, clinical for [employer], [facility]

## 2021-12-06 ENCOUNTER — Other Ambulatory Visit (HOSPITAL_COMMUNITY)
Admission: RE | Admit: 2021-12-06 | Discharge: 2021-12-06 | Disposition: A | Discharge status: Home / Self Care | Payer: Medicaid Other | Source: Ambulatory Visit | Attending: Family Medicine | Admitting: Family Medicine

## 2021-12-06 ENCOUNTER — Other Ambulatory Visit: Payer: Self-pay

## 2021-12-06 ENCOUNTER — Ambulatory Visit: Payer: Medicaid Other

## 2021-12-06 VITALS — BP 114/75 | HR 85 | Wt 275.0 lb

## 2021-12-06 DIAGNOSIS — N898 Other specified noninflammatory disorders of vagina: Secondary | ICD-10-CM | POA: Diagnosis present

## 2021-12-06 DIAGNOSIS — B9689 Other specified bacterial agents as the cause of diseases classified elsewhere: Secondary | ICD-10-CM

## 2021-12-06 NOTE — Progress Notes (Signed)
Pt here for self swab nurse visit. Pt c/o vaginal odor and vaginal irritation. Pt denies UTI symptoms. Pt requested Aptima for BV and yeast. Pt is aware she will be contacted with results.

## 2021-12-08 LAB — CERVICOVAGINAL ANCILLARY ONLY
Bacterial Vaginitis (gardnerella): POSITIVE — AB
Candida Glabrata: NEGATIVE
Candida Vaginitis: NEGATIVE
Comment: NEGATIVE
Comment: NEGATIVE
Comment: NEGATIVE

## 2021-12-09 MED ORDER — METRONIDAZOLE 500 MG PO TABS: 500.0000 mg | ORAL_TABLET | Freq: Two times a day (BID) | ORAL | 0 refills | Status: AC

## 2021-12-09 NOTE — Addendum Note (Signed)
Addended by: Reva Bores on: 12/09/2021 11:05 AM   Modules accepted: Orders

## 2021-12-13 ENCOUNTER — Ambulatory Visit
Admission: RE | Admit: 2021-12-13 | Discharge: 2021-12-13 | Disposition: A | Discharge status: Home / Self Care | Payer: Medicaid Other | Source: Ambulatory Visit | Attending: Obstetrics & Gynecology | Admitting: Obstetrics & Gynecology

## 2021-12-13 ENCOUNTER — Other Ambulatory Visit: Payer: Self-pay

## 2021-12-13 DIAGNOSIS — N926 Irregular menstruation, unspecified: Secondary | ICD-10-CM | POA: Diagnosis present

## 2021-12-27 ENCOUNTER — Ambulatory Visit: Payer: Medicaid Other | Admitting: Obstetrics & Gynecology

## 2022-01-10 ENCOUNTER — Ambulatory Visit (INDEPENDENT_AMBULATORY_CARE_PROVIDER_SITE_OTHER): Payer: Medicaid Other | Admitting: Family Medicine

## 2022-01-10 ENCOUNTER — Other Ambulatory Visit (HOSPITAL_COMMUNITY)
Admission: RE | Admit: 2022-01-10 | Discharge: 2022-01-10 | Disposition: A | Discharge status: Home / Self Care | Payer: Medicaid Other | Source: Ambulatory Visit | Attending: Family Medicine | Admitting: Family Medicine

## 2022-01-10 ENCOUNTER — Other Ambulatory Visit: Payer: Self-pay

## 2022-01-10 ENCOUNTER — Encounter: Payer: Self-pay | Admitting: Family Medicine

## 2022-01-10 VITALS — BP 122/79 | HR 90 | Wt 281.0 lb

## 2022-01-10 DIAGNOSIS — N920 Excessive and frequent menstruation with regular cycle: Secondary | ICD-10-CM

## 2022-01-10 DIAGNOSIS — N898 Other specified noninflammatory disorders of vagina: Secondary | ICD-10-CM | POA: Insufficient documentation

## 2022-01-10 MED ORDER — METRONIDAZOLE 500 MG PO TABS: 500.0000 mg | ORAL_TABLET | Freq: Two times a day (BID) | ORAL | 0 refills | Status: DC

## 2022-01-10 MED ORDER — DOXYCYCLINE HYCLATE 100 MG PO CAPS: 100.0000 mg | ORAL_CAPSULE | Freq: Two times a day (BID) | ORAL | 0 refills | Status: DC

## 2022-01-10 MED ORDER — FLUCONAZOLE 150 MG PO TABS
150.0000 mg | ORAL_TABLET | Freq: Once | ORAL | 2 refills | Status: AC
Start: 2022-01-10 — End: 2022-01-10

## 2022-01-10 NOTE — Addendum Note (Signed)
Addended by: Reva Bores on: 01/10/2022 05:14 PM   Modules accepted: Orders

## 2022-01-10 NOTE — Assessment & Plan Note (Signed)
Weight loss surgery may help. Normal u/s. Will give trial of doxy, which may help with her bleeding and may be present after IUD for some low grade endometritis.

## 2022-01-10 NOTE — Progress Notes (Signed)
° °  Subjective:    Patient ID: Tiffany Sanchez is a 26 y.o. female presenting with Follow-up  on 01/10/2022  HPI: Cycles are monthly and last 11 days. Has happened after IUD placed. Has lost IUD x 2. Does not want contraception. She could not tolerate COC's due to effects on BP. Interested in gastric sleeve soon. Reports vaginal odor. Does take baths.  Review of Systems  Constitutional:  Negative for chills and fever.  Respiratory:  Negative for shortness of breath.   Cardiovascular:  Negative for chest pain.  Gastrointestinal:  Negative for abdominal pain, nausea and vomiting.  Genitourinary:  Negative for dysuria.  Skin:  Negative for rash.     Objective:    BP 122/79    Pulse 90    Wt 281 lb (127.5 kg)    LMP 12/22/2021    BMI 44.01 kg/m  Physical Exam Constitutional:      General: She is not in acute distress.    Appearance: She is well-developed.  HENT:     Head: Normocephalic and atraumatic.  Eyes:     General: No scleral icterus. Cardiovascular:     Rate and Rhythm: Normal rate.  Pulmonary:     Effort: Pulmonary effort is normal.  Abdominal:     Palpations: Abdomen is soft.  Musculoskeletal:     Cervical back: Neck supple.  Skin:    General: Skin is warm and dry.  Neurological:     Mental Status: She is alert and oriented to person, place, and time.        Assessment & Plan:   Problem List Items Addressed This Visit       Unprioritized   Menorrhagia with regular cycle - Primary    Weight loss surgery may help. Normal u/s. Will give trial of doxy, which may help with her bleeding and may be present after IUD for some low grade endometritis.      Relevant Medications   doxycycline (VIBRAMYCIN) 100 MG capsule   Other Visit Diagnoses     Vaginal discharge       rx for Flagyl, diflucan as needed--stop taking baths.   Relevant Medications   fluconazole (DIFLUCAN) 150 MG tablet   metroNIDAZOLE (FLAGYL) 500 MG tablet   Other Relevant Orders    Cervicovaginal ancillary only( [place])        Return if symptoms worsen or fail to improve.  Tiffany Bores, MD 01/10/2022 4:44 PM

## 2022-01-15 LAB — CERVICOVAGINAL ANCILLARY ONLY
Bacterial Vaginitis (gardnerella): NEGATIVE
Candida Glabrata: NEGATIVE
Candida Vaginitis: NEGATIVE
Chlamydia: NEGATIVE
Comment: NEGATIVE
Comment: NEGATIVE
Comment: NEGATIVE
Comment: NEGATIVE
Comment: NEGATIVE
Comment: NORMAL
Neisseria Gonorrhea: NEGATIVE
Trichomonas: NEGATIVE

## 2022-01-23 ENCOUNTER — Ambulatory Visit: Payer: Medicaid Other | Admitting: Obstetrics & Gynecology

## 2022-01-25 DIAGNOSIS — I10 Essential (primary) hypertension: Secondary | ICD-10-CM | POA: Insufficient documentation

## 2022-01-25 DIAGNOSIS — K219 Gastro-esophageal reflux disease without esophagitis: Secondary | ICD-10-CM | POA: Insufficient documentation

## 2022-01-25 HISTORY — DX: Essential (primary) hypertension: I10

## 2022-01-29 ENCOUNTER — Telehealth: Payer: Self-pay | Admitting: *Deleted

## 2022-01-29 NOTE — Telephone Encounter (Signed)
Pt called asking when should she get tested for STD's after having unprotected sex on Friday night. Denies any known exposure and denies any symptoms. Will ask a provider and send mychart ,message back.  ?

## 2022-02-05 ENCOUNTER — Other Ambulatory Visit: Payer: Self-pay | Admitting: *Deleted

## 2022-02-05 MED ORDER — CEFADROXIL 500 MG PO CAPS: 500.0000 mg | ORAL_CAPSULE | Freq: Two times a day (BID) | ORAL | 0 refills | Status: DC

## 2022-02-05 MED ORDER — METRONIDAZOLE 500 MG PO TABS: 500.0000 mg | ORAL_TABLET | Freq: Two times a day (BID) | ORAL | 0 refills | Status: DC

## 2022-03-07 ENCOUNTER — Other Ambulatory Visit (HOSPITAL_COMMUNITY)
Admission: RE | Admit: 2022-03-07 | Discharge: 2022-03-07 | Disposition: A | Discharge status: Home / Self Care | Payer: Medicaid Other | Source: Ambulatory Visit | Attending: Obstetrics and Gynecology | Admitting: Obstetrics and Gynecology

## 2022-03-07 ENCOUNTER — Ambulatory Visit (INDEPENDENT_AMBULATORY_CARE_PROVIDER_SITE_OTHER): Payer: Medicaid Other | Admitting: *Deleted

## 2022-03-07 ENCOUNTER — Ambulatory Visit: Payer: Medicaid Other | Admitting: Obstetrics and Gynecology

## 2022-03-07 VITALS — BP 123/82 | HR 78

## 2022-03-07 DIAGNOSIS — Z113 Encounter for screening for infections with a predominantly sexual mode of transmission: Secondary | ICD-10-CM | POA: Insufficient documentation

## 2022-03-07 DIAGNOSIS — Z87440 Personal history of urinary (tract) infections: Secondary | ICD-10-CM

## 2022-03-07 NOTE — Progress Notes (Signed)
SUBJECTIVE:  ?26 y.o. female here to be check to for STI and did have UTI symptoms last week, took AZO and is better but would like to be sure.  ?Denies abnormal vaginal bleeding or significant pelvic pain or ?fever.Denies history of known exposure to STD. ? ?Patient's last menstrual period was 03/06/2022 (exact date). ? ?OBJECTIVE:  ?She appears alert, well appearing, in no apparent distress ?Urine dipstick: not done. ? ?ASSESSMENT:  ?STI screen ?History of UTI's ? ? ? ?PLAN:  ?GC, chlamydia, trichomonas, BVAG, CVAG probe and urine culture sent to lab. ?Treatment: To be determined once lab results are received ?ROV prn if symptoms persist or worsen.  ? ?Scheryl Marten, RN  ?

## 2022-03-08 LAB — CERVICOVAGINAL ANCILLARY ONLY
Bacterial Vaginitis (gardnerella): POSITIVE — AB
Candida Glabrata: NEGATIVE
Candida Vaginitis: NEGATIVE
Chlamydia: NEGATIVE
Comment: NEGATIVE
Comment: NEGATIVE
Comment: NEGATIVE
Comment: NEGATIVE
Comment: NEGATIVE
Comment: NORMAL
Neisseria Gonorrhea: NEGATIVE
Trichomonas: NEGATIVE

## 2022-03-09 ENCOUNTER — Other Ambulatory Visit: Payer: Self-pay | Admitting: *Deleted

## 2022-03-09 ENCOUNTER — Other Ambulatory Visit: Payer: Self-pay | Admitting: Obstetrics & Gynecology

## 2022-03-09 LAB — URINE CULTURE

## 2022-03-09 MED ORDER — METRONIDAZOLE 500 MG PO TABS: 500.0000 mg | ORAL_TABLET | Freq: Two times a day (BID) | ORAL | 0 refills | Status: DC

## 2022-03-20 ENCOUNTER — Other Ambulatory Visit: Payer: Self-pay | Admitting: *Deleted

## 2022-03-20 MED ORDER — METRONIDAZOLE 0.75 % VA GEL: 1.0000 | Freq: Every day | VAGINAL | 1 refills | Status: DC

## 2022-03-28 ENCOUNTER — Telehealth (INDEPENDENT_AMBULATORY_CARE_PROVIDER_SITE_OTHER): Payer: Medicaid Other | Admitting: Family Medicine

## 2022-03-28 DIAGNOSIS — Z6841 Body Mass Index (BMI) 40.0 and over, adult: Secondary | ICD-10-CM

## 2022-03-28 DIAGNOSIS — I10 Essential (primary) hypertension: Secondary | ICD-10-CM | POA: Diagnosis not present

## 2022-03-28 DIAGNOSIS — Z3009 Encounter for other general counseling and advice on contraception: Secondary | ICD-10-CM

## 2022-03-28 DIAGNOSIS — N920 Excessive and frequent menstruation with regular cycle: Secondary | ICD-10-CM

## 2022-03-28 DIAGNOSIS — N870 Mild cervical dysplasia: Secondary | ICD-10-CM

## 2022-03-28 NOTE — Assessment & Plan Note (Signed)
Needs repeat pap now at 1 year post procedure. ?

## 2022-03-28 NOTE — Assessment & Plan Note (Signed)
Having gastric sleeve soon and wants contraception. ?

## 2022-03-28 NOTE — Assessment & Plan Note (Signed)
Given bleeding, she is not a candidate for CuIUD. ?

## 2022-03-28 NOTE — Progress Notes (Signed)
? ? ?GYNECOLOGY VIRTUAL VISIT ENCOUNTER NOTE ? ?Provider location: Center for [employer] at [facility]  ? ?Patient location: Home ? ?I connected with Tiffany Sanchez on 03/28/22 at  2:10 PM EDT by MyChart Video Encounter and verified that I am speaking with the correct person using two identifiers. ?  ?I discussed the limitations, risks, security and privacy concerns of performing an evaluation and management service virtually and the availability of in person appointments. I also discussed with the patient that there may be a patient responsible charge related to this service. The patient expressed understanding and agreed to proceed. ?  ?History:  ?Tiffany Sanchez is a 26 y.o. [phone] female being evaluated today for possible IUD. She has had an IUD in the past x 2 and she expelled them twice. She strongly desires another one. She is not a candidate for coc's due to HTN. She has menorrhagia and would not be a candidate for CuIUD. Does not want weight gain and so declines implant and depo. Offered Slynd, but she strongly wants IUD. She denies any abnormal vaginal discharge, bleeding, pelvic pain or other concerns.   ?  ?  ?Past Medical History:  ?Diagnosis Date  ? Anemia   ? Asthma   ? no hospitalizations, no recent attacks (since childhood)  ? Cervical dysplasia   ? Complication of anesthesia   ? one sided epidural  ? Genital herpes 06/03/2018  ? History of gestational hypertension   ? Low grade squamous intraepithelial lesion (LGSIL) at risk for high grade squamous intraepithelial lesion (HGSIL) on cytologic smear of cervix 11/10/2020  ? Given concern about high grade lesion, colposcopy recommended. Of note, also had LGSIL pap in 2018 ([facility]Everywhere results), but had normal pap in 2019.   ? Ovarian cyst   ? Trichomonal vaginitis 04/20/2020  ? Late may 2021  ? UTI (urinary tract infection)   ? ?Past Surgical History:  ?Procedure Laterality Date  ? CESAREAN SECTION N/A 06/28/2018  ? Procedure: CESAREAN  SECTION;  Surgeon: Levie Heritage, DO;  Location: [facility] BIRTHING SUITES;  Service: Obstetrics;  Laterality: N/A;  ? THERAPEUTIC ABORTION    ? ?The following portions of the patient's history were reviewed and updated as appropriate: allergies, current medications, past family history, past medical history, past social history, past surgical history and problem list.  ? ?Health Maintenance:  LGSIL on 11/10/2020. Colpo showed CIN1 ? ?Review of Systems:  ?Pertinent items noted in HPI and remainder of comprehensive ROS otherwise negative. ? ?Physical Exam:  ? ?General:  Alert, oriented and cooperative. Patient appears to be in no acute distress.  ?Mental Status: Normal mood and affect. Normal behavior. Normal judgment and thought content.   ?Respiratory: Normal respiratory effort, no problems with respiration noted  ?Rest of physical exam deferred due to type of encounter ? ?Labs and Imaging ?No results found for this or any previous visit (from the past 336 hour(s)). ?No results found.   ?  ?Assessment and Plan:  ?   ?Problem List Items Addressed This Visit   ? ?  ? Unprioritized  ? BMI 50.0-59.9, adult (HCC)  ?  Having gastric sleeve soon and wants contraception. ? ?  ?  ? Dysplasia of cervix, low grade (CIN 1) - Primary  ?  Needs repeat pap now at 1 year post procedure. ? ?  ?  ? Menorrhagia with regular cycle  ?  Given bleeding, she is not a candidate for CuIUD. ? ?  ?  ?  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  Not a candidate for COCs due to this. ? ?  ?  ? ?Other Visit Diagnoses   ? ? Encounter for counseling regarding contraception      ? Has had an IUD x 2 and they have been expelled--she desires another after discussion of other alternatives. Will attempt LnIUD 1 more time.  ? ?  ? ? ?   ?  ?I discussed the assessment and treatment plan with the patient. The patient was provided an opportunity to ask questions and all were answered. The patient agreed with the plan and demonstrated an understanding  of the instructions. ?  ?The patient was advised to call back or seek an in-person evaluation/go to the ED if the symptoms worsen or if the condition fails to improve as anticipated. ? ?I provided 16 minutes of face-to-face time during this encounter. ? ?Return in about 4 weeks (around 04/25/2022) for a follow-up, repeat pap, iud Placement. ? ? ?Reva Bores, MD ?Center for [facility], [facility] ? ?

## 2022-03-28 NOTE — Assessment & Plan Note (Signed)
Not a candidate for COCs due to this. ?

## 2022-03-28 NOTE — Progress Notes (Signed)
Discuss birth control 

## 2022-04-03 ENCOUNTER — Other Ambulatory Visit: Payer: Self-pay | Admitting: *Deleted

## 2022-04-03 MED ORDER — NORETHINDRONE ACETATE 5 MG PO TABS
5.0000 mg | ORAL_TABLET | Freq: Every day | ORAL | 2 refills | Status: DC
Start: 2022-04-03 — End: 2022-06-06

## 2022-04-04 ENCOUNTER — Other Ambulatory Visit (HOSPITAL_COMMUNITY)
Admission: RE | Admit: 2022-04-04 | Discharge: 2022-04-04 | Disposition: A | Discharge status: Home / Self Care | Payer: Medicaid Other | Source: Ambulatory Visit | Attending: Family Medicine | Admitting: Family Medicine

## 2022-04-04 ENCOUNTER — Encounter: Payer: Self-pay | Admitting: Family Medicine

## 2022-04-04 ENCOUNTER — Ambulatory Visit: Payer: Medicaid Other | Admitting: Family Medicine

## 2022-04-04 VITALS — BP 138/83 | HR 80 | Wt 272.0 lb

## 2022-04-04 DIAGNOSIS — R87612 Low grade squamous intraepithelial lesion on cytologic smear of cervix (LGSIL): Secondary | ICD-10-CM | POA: Insufficient documentation

## 2022-04-04 DIAGNOSIS — Z30430 Encounter for insertion of intrauterine contraceptive device: Secondary | ICD-10-CM

## 2022-04-04 LAB — POCT URINE PREGNANCY: Preg Test, Ur: NEGATIVE

## 2022-04-04 MED ORDER — LEVONORGESTREL 20.1 MCG/DAY IU IUD
1.0000 | INTRAUTERINE_SYSTEM | Freq: Once | INTRAUTERINE | Status: AC
Start: 2022-04-04 — End: 2022-04-04
  Administered 2022-04-04: 1 via INTRAUTERINE

## 2022-04-04 NOTE — Progress Notes (Signed)
? ?  Subjective:  ? ? Patient ID: Tiffany Sanchez is a 26 y.o. female presenting with Contraception and Gynecologic Exam ? on 04/04/2022 ? ?HPI: ?Patient with h/o LGSIL and needs repeat pap smear. She is having regular heavy cycles and would like anoher IUD though she has expelled 2 previously. Her cycle started this week and was heavy--given some Aygestin which decreased her bleeding. She is trying to get through bariatric surgery without a pregnancy. ? ?Review of Systems  ?Constitutional:  Negative for chills and fever.  ?Respiratory:  Negative for shortness of breath.   ?Cardiovascular:  Negative for chest pain.  ?Gastrointestinal:  Negative for abdominal pain, nausea and vomiting.  ?Genitourinary:  Negative for dysuria.  ?Skin:  Negative for rash.  ?   ?Objective:  ?  ?BP 138/83   Pulse 80   Wt 272 lb (123.4 kg)   LMP 03/06/2022 (Exact Date)   BMI 42.60 kg/m?  ?Physical Exam ?Exam conducted with a chaperone present.  ?Constitutional:   ?   General: She is not in acute distress. ?   Appearance: She is well-developed.  ?HENT:  ?   Head: Normocephalic and atraumatic.  ?Eyes:  ?   General: No scleral icterus. ?Cardiovascular:  ?   Rate and Rhythm: Normal rate.  ?Pulmonary:  ?   Effort: Pulmonary effort is normal.  ?Abdominal:  ?   Palpations: Abdomen is soft.  ?Genitourinary: ?   Comments: BUS normal, vagina is pink and rugated, cervix is parous without lesion ?Musculoskeletal:  ?   Cervical back: Neck supple.  ?Skin: ?   General: Skin is warm and dry.  ?Neurological:  ?   Mental Status: She is alert and oriented to person, place, and time.  ? ?Procedure: ?Patient identified, informed consent performed, signed copy in chart, time out was performed.  Urine pregnancy test negative. ? ?Speculum placed in the vagina.  Cervix visualized.  Cleaned with Betadine x 2.  Grasped anteriourly with a single tooth tenaculum.  Uterus sounded to 9 cm.  Mirena IUD placed per manufacturer's recommendations.  Strings trimmed to 3 cm.   ? ?   ?Assessment & Plan:  ?Encounter for IUD insertion - Patient given post procedure instructions and Mirena care card with expiration date.  Patient is asked to check IUD strings periodically and follow up in 4 wks - Plan: POCT urine pregnancy ? ?Low grade squamous intraepithelial lesion (LGSIL) at risk for high grade lesion on pap smear 11/10/20 - repeat pap today - Plan: Cytology - PAP( [place]) ? ?Return in about 4 weeks (around 05/02/2022) for iud check. ? ?Reva Bores, MD ?04/04/2022 ?3:28 PM ? ? ?

## 2022-04-09 LAB — CYTOLOGY - PAP

## 2022-04-25 ENCOUNTER — Ambulatory Visit: Payer: Medicaid Other | Admitting: Family Medicine

## 2022-04-25 ENCOUNTER — Other Ambulatory Visit (HOSPITAL_COMMUNITY)
Admission: RE | Admit: 2022-04-25 | Discharge: 2022-04-25 | Disposition: A | Discharge status: Home / Self Care | Payer: Medicaid Other | Source: Ambulatory Visit | Attending: Family Medicine | Admitting: Family Medicine

## 2022-04-25 ENCOUNTER — Ambulatory Visit (INDEPENDENT_AMBULATORY_CARE_PROVIDER_SITE_OTHER): Payer: Medicaid Other | Admitting: Family Medicine

## 2022-04-25 ENCOUNTER — Encounter: Payer: Self-pay | Admitting: Family Medicine

## 2022-04-25 VITALS — BP 128/80 | HR 93

## 2022-04-25 DIAGNOSIS — N760 Acute vaginitis: Secondary | ICD-10-CM | POA: Diagnosis not present

## 2022-04-25 DIAGNOSIS — N939 Abnormal uterine and vaginal bleeding, unspecified: Secondary | ICD-10-CM | POA: Diagnosis not present

## 2022-04-25 DIAGNOSIS — R102 Pelvic and perineal pain: Secondary | ICD-10-CM | POA: Insufficient documentation

## 2022-04-25 DIAGNOSIS — Z3202 Encounter for pregnancy test, result negative: Secondary | ICD-10-CM | POA: Diagnosis not present

## 2022-04-25 LAB — POCT URINALYSIS DIPSTICK: Leukocytes, UA: NEGATIVE

## 2022-04-25 LAB — POCT URINE PREGNANCY: Preg Test, Ur: NEGATIVE

## 2022-04-25 MED ORDER — DOXYCYCLINE HYCLATE 100 MG PO CAPS: 100.0000 mg | ORAL_CAPSULE | Freq: Two times a day (BID) | ORAL | 0 refills | Status: DC

## 2022-04-25 NOTE — Progress Notes (Signed)
   Subjective:    Patient ID: Tiffany Sanchez is a 26 y.o. female presenting with Vaginal Discharge  on 04/25/2022  HPI: Since we place her IUD, has had some spotting and bleeding after intercourse. Has some vaginal odor and recurrent BV--and bleeding and has pain with intercourse.  Review of Systems  Constitutional:  Negative for chills and fever.  Respiratory:  Negative for shortness of breath.   Cardiovascular:  Negative for chest pain.  Gastrointestinal:  Negative for abdominal pain, nausea and vomiting.  Genitourinary:  Positive for vaginal bleeding. Negative for dysuria.  Skin:  Negative for rash.     Objective:    BP 128/80   Pulse 93   LMP 04/02/2022 (Exact Date)  Physical Exam Exam conducted with a chaperone present.  Constitutional:      General: She is not in acute distress.    Appearance: She is well-developed.  HENT:     Head: Normocephalic and atraumatic.  Eyes:     General: No scleral icterus. Cardiovascular:     Rate and Rhythm: Normal rate.  Pulmonary:     Effort: Pulmonary effort is normal.  Abdominal:     Palpations: Abdomen is soft.  Genitourinary:    General: Normal vulva.     Vagina: Normal.     Cervix: No lesion.     Comments: IUD strings at cervix with clot adherent Musculoskeletal:     Cervical back: Neck supple.  Skin:    General: Skin is warm and dry.  Neurological:     Mental Status: She is alert and oriented to person, place, and time.   UPT negative U/a negative except for blood.       Assessment & Plan:  Abnormal uterine bleeding - likely related to her IIUD recent placement. Patience is recommended. - Plan: POCT urine pregnancy, doxycycline (VIBRAMYCIN) 100 MG capsule  Pelvic pain - trial of doxy--no evidence of UTI, UPT is negative. - Plan: POCT Urinalysis Dipstick, Cervicovaginal ancillary only( [place])  Recurrent vaginitis - trial of boric acid suppositories, 2x/wk and after intercourse.  Return in about 2 weeks  (around 05/09/2022) for colpo.  Reva Bores, MD 04/25/2022 3:49 PM

## 2022-04-30 LAB — CERVICOVAGINAL ANCILLARY ONLY
Bacterial Vaginitis (gardnerella): POSITIVE — AB
Candida Glabrata: NEGATIVE
Candida Vaginitis: NEGATIVE
Chlamydia: NEGATIVE
Comment: NEGATIVE
Comment: NEGATIVE
Comment: NEGATIVE
Comment: NEGATIVE
Comment: NEGATIVE
Comment: NORMAL
Neisseria Gonorrhea: NEGATIVE
Trichomonas: NEGATIVE

## 2022-05-01 ENCOUNTER — Other Ambulatory Visit: Payer: Self-pay | Admitting: *Deleted

## 2022-05-01 MED ORDER — METRONIDAZOLE 500 MG PO TABS: 500.0000 mg | ORAL_TABLET | Freq: Two times a day (BID) | ORAL | 0 refills | Status: AC

## 2022-05-01 MED ORDER — FLUCONAZOLE 150 MG PO TABS: 150.0000 mg | ORAL_TABLET | Freq: Once | ORAL | 3 refills | Status: AC

## 2022-05-09 ENCOUNTER — Other Ambulatory Visit: Payer: Self-pay | Admitting: Family Medicine

## 2022-05-09 ENCOUNTER — Ambulatory Visit (INDEPENDENT_AMBULATORY_CARE_PROVIDER_SITE_OTHER): Payer: Medicaid Other | Admitting: Family Medicine

## 2022-05-09 VITALS — BP 111/75 | HR 112 | Wt 275.0 lb

## 2022-05-09 DIAGNOSIS — R87612 Low grade squamous intraepithelial lesion on cytologic smear of cervix (LGSIL): Secondary | ICD-10-CM

## 2022-05-09 DIAGNOSIS — Z30011 Encounter for initial prescription of contraceptive pills: Secondary | ICD-10-CM | POA: Diagnosis not present

## 2022-05-09 DIAGNOSIS — Z30432 Encounter for removal of intrauterine contraceptive device: Secondary | ICD-10-CM | POA: Diagnosis not present

## 2022-05-09 DIAGNOSIS — N870 Mild cervical dysplasia: Secondary | ICD-10-CM

## 2022-05-09 LAB — POCT URINE PREGNANCY: Preg Test, Ur: NEGATIVE

## 2022-05-09 MED ORDER — SLYND 4 MG PO TABS: 1.0000 | ORAL_TABLET | Freq: Every day | ORAL | 3 refills | Status: DC

## 2022-05-09 NOTE — Progress Notes (Signed)
   Subjective:    Patient ID: Tiffany Sanchez is a 26 y.o. female presenting with No chief complaint on file.  on 05/09/2022  HPI: Here today for colpo and IUD removal. IUD in x 4 weeks. She reports increased appetite. She has acne and wants it out--She is not a candidate for COCs due to BP. 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  Review of Systems  Constitutional:  Negative for chills and fever.  Respiratory:  Negative for shortness of breath.   Cardiovascular:  Negative for chest pain.  Gastrointestinal:  Negative for abdominal pain, nausea and vomiting.  Genitourinary:  Negative for dysuria.  Skin:  Negative for rash.      Objective:    BP 111/75   Pulse (!) 112   Wt 275 lb (124.7 kg)   BMI 43.07 kg/m  Physical Exam Exam conducted with a chaperone present.  Constitutional:      General: She is not in acute distress.    Appearance: She is well-developed.  HENT:     Head: Normocephalic and atraumatic.  Eyes:     General: No scleral icterus. Cardiovascular:     Rate and Rhythm: Normal rate.  Pulmonary:     Effort: Pulmonary effort is normal.  Abdominal:     Palpations: Abdomen is soft.  Musculoskeletal:     Cervical back: Neck supple.  Skin:    General: Skin is warm and dry.  Neurological:     Mental Status: She is alert and oriented to person, place, and time.   Procedure: Speculum placed inside vagina.  Cervix visualized.  Strings grasped with ring forceps.  IUD removed intact.  Procedure: Patient given informed consent, signed copy in the chart, time out was performed.  Placed in lithotomy position. Cervix viewed with speculum and colposcope after application of acetic acid.   Colposcopy adequate?  yes Acetowhite lesions?SCJ Punctation?no Mosaicism?  no Abnormal vasculature?  no Biopsies?4 quadrants ECC?yes     Assessment & Plan:   No follow-ups on file.  Reva Bores, MD 05/09/2022 3:18 PM

## 2022-05-09 NOTE — Assessment & Plan Note (Signed)
Colpo performed. Manage results according to ASCCP guidelines.

## 2022-05-29 ENCOUNTER — Telehealth: Payer: Self-pay

## 2022-05-29 NOTE — Telephone Encounter (Signed)
Return call to pt regarding triage message.  Pt states since IUD was removed noted bleedig and clots Pt concerned  Pt asked could she had been pregnant and is now having a miscarriage due to appearance of clots.  I did advise pt that with stopping or starting contraception you may experience irregular bleeding/spotting. Pt asked I rely concerns to Dr.Pratt for assurance.  Please advise.

## 2022-05-30 ENCOUNTER — Ambulatory Visit: Payer: Medicaid Other | Admitting: Family Medicine

## 2022-05-30 DIAGNOSIS — N760 Acute vaginitis: Secondary | ICD-10-CM

## 2022-05-30 MED ORDER — METRONIDAZOLE 500 MG PO TABS: 500.0000 mg | ORAL_TABLET | Freq: Two times a day (BID) | ORAL | 0 refills | Status: DC

## 2022-06-04 ENCOUNTER — Encounter (HOSPITAL_BASED_OUTPATIENT_CLINIC_OR_DEPARTMENT_OTHER): Payer: Self-pay

## 2022-06-04 ENCOUNTER — Other Ambulatory Visit: Payer: Self-pay

## 2022-06-04 ENCOUNTER — Telehealth: Payer: Self-pay | Admitting: *Deleted

## 2022-06-04 ENCOUNTER — Emergency Department (HOSPITAL_BASED_OUTPATIENT_CLINIC_OR_DEPARTMENT_OTHER): Payer: Medicaid Other | Admitting: Radiology

## 2022-06-04 ENCOUNTER — Emergency Department
Admission: EM | Admit: 2022-06-04 | Discharge: 2022-06-04 | Discharge status: Against Medical Advice | Payer: Medicaid Other

## 2022-06-04 DIAGNOSIS — Z5321 Procedure and treatment not carried out due to patient leaving prior to being seen by health care provider: Secondary | ICD-10-CM | POA: Insufficient documentation

## 2022-06-04 DIAGNOSIS — R0602 Shortness of breath: Secondary | ICD-10-CM | POA: Diagnosis not present

## 2022-06-04 DIAGNOSIS — R112 Nausea with vomiting, unspecified: Secondary | ICD-10-CM | POA: Diagnosis not present

## 2022-06-04 DIAGNOSIS — R079 Chest pain, unspecified: Secondary | ICD-10-CM | POA: Insufficient documentation

## 2022-06-04 DIAGNOSIS — N920 Excessive and frequent menstruation with regular cycle: Secondary | ICD-10-CM | POA: Diagnosis present

## 2022-06-04 DIAGNOSIS — R42 Dizziness and giddiness: Secondary | ICD-10-CM | POA: Diagnosis not present

## 2022-06-04 LAB — CBC
HCT: 31.8 % — ABNORMAL LOW (ref 36.0–46.0)
Hemoglobin: 10.2 g/dL — ABNORMAL LOW (ref 12.0–15.0)
MCH: 26.2 pg (ref 26.0–34.0)
MCHC: 32.1 g/dL (ref 30.0–36.0)
MCV: 81.5 fL (ref 80.0–100.0)
Platelets: 318 10*3/uL (ref 150–400)
RBC: 3.9 MIL/uL (ref 3.87–5.11)
RDW: 13.6 % (ref 11.5–15.5)
WBC: 7.8 10*3/uL (ref 4.0–10.5)
nRBC: 0 % (ref 0.0–0.2)

## 2022-06-04 LAB — BASIC METABOLIC PANEL
Anion gap: 9 (ref 5–15)
BUN: 15 mg/dL (ref 6–20)
CO2: 22 mmol/L (ref 22–32)
Calcium: 9.6 mg/dL (ref 8.9–10.3)
Chloride: 104 mmol/L (ref 98–111)
Creatinine, Ser: 0.85 mg/dL (ref 0.44–1.00)
GFR, Estimated: >60 mL/min (ref >60)
Glucose, Bld: 117 mg/dL — ABNORMAL HIGH (ref 70–99)
Potassium: 3.3 mmol/L — ABNORMAL LOW (ref 3.5–5.1)
Sodium: 135 mmol/L (ref 135–145)

## 2022-06-04 LAB — TROPONIN I (HIGH SENSITIVITY): Troponin I (High Sensitivity): <2 ng/L (ref <18)

## 2022-06-04 LAB — HCG, SERUM, QUALITATIVE: Preg, Serum: NEGATIVE

## 2022-06-04 NOTE — ED Notes (Signed)
No answer when called several times from lobby 

## 2022-06-04 NOTE — Telephone Encounter (Signed)
Left message for pt to call back  °

## 2022-06-04 NOTE — ED Triage Notes (Signed)
Patient here POV from Home.  Endorses Heavy Vaginal Bleeding during Menstrual Cycles. States had IUD inserted 2 Months ago to help with Symptoms but states Symptoms have Continued and IUD was also removed approximately 2 Months ago. Still endorses Heavy Vaginal Bleeding.   Also endorses CP and Dizziness that Began Today. Mild SOB. Moderate Nausea and 1 Episode of Emesis today. No Diarrhea.  NAD Noted during Triage. A&Ox4. GCS 15. Ambulatory.

## 2022-06-05 ENCOUNTER — Emergency Department (HOSPITAL_BASED_OUTPATIENT_CLINIC_OR_DEPARTMENT_OTHER)
Admission: EM | Admit: 2022-06-05 | Discharge: 2022-06-05 | Discharge status: Against Medical Advice | Payer: Medicaid Other | Attending: Emergency Medicine | Admitting: Emergency Medicine

## 2022-06-05 NOTE — ED Notes (Signed)
Patient able to ambulate to restroom. Steady gait, no signs of distress

## 2022-06-05 NOTE — ED Notes (Signed)
Patient expressed frustration with waiting. Updated on plan of care and delay several times. Patient asked to sign out AMA.

## 2022-06-06 ENCOUNTER — Telehealth: Payer: Self-pay

## 2022-06-06 ENCOUNTER — Ambulatory Visit: Payer: Medicaid Other | Admitting: Obstetrics and Gynecology

## 2022-06-06 ENCOUNTER — Encounter: Payer: Self-pay | Admitting: Obstetrics and Gynecology

## 2022-06-06 VITALS — BP 128/86 | HR 94 | Ht 67.0 in | Wt 271.8 lb

## 2022-06-06 DIAGNOSIS — A6004 Herpesviral vulvovaginitis: Secondary | ICD-10-CM

## 2022-06-06 DIAGNOSIS — I10 Essential (primary) hypertension: Secondary | ICD-10-CM | POA: Diagnosis not present

## 2022-06-06 DIAGNOSIS — N939 Abnormal uterine and vaginal bleeding, unspecified: Secondary | ICD-10-CM | POA: Diagnosis not present

## 2022-06-06 LAB — POCT URINE PREGNANCY: Preg Test, Ur: NEGATIVE

## 2022-06-06 MED ORDER — NORETHINDRONE ACETATE 5 MG PO TABS: 5.0000 mg | ORAL_TABLET | Freq: Two times a day (BID) | ORAL | 0 refills | Status: DC

## 2022-06-06 MED ORDER — VALACYCLOVIR HCL 500 MG PO TABS
500.0000 mg | ORAL_TABLET | Freq: Every day | ORAL | 1 refills | Status: DC
Start: 2022-06-06 — End: 2023-06-22

## 2022-06-06 MED ORDER — TRANEXAMIC ACID 650 MG PO TABS: 1300.0000 mg | ORAL_TABLET | Freq: Three times a day (TID) | ORAL | 2 refills | Status: DC

## 2022-06-06 MED ORDER — VALACYCLOVIR HCL 500 MG PO TABS
500.0000 mg | ORAL_TABLET | Freq: Every day | ORAL | 0 refills | Status: DC
Start: 2022-06-06 — End: 2022-06-06

## 2022-06-06 NOTE — Telephone Encounter (Signed)
Called pharmacy and spoke with Loraine Leriche. Tiffany Sanchez to cancel the Lysteda Dr. Vergie Living sent over.

## 2022-06-06 NOTE — Progress Notes (Signed)
Refill Valtrex  FBX:[id number]

## 2022-06-06 NOTE — Progress Notes (Signed)
Obstetrics and Gynecology Visit Return Patient Evaluation  Appointment Date: 06/06/2022  Primary Care Provider: Emogene Morgan  OBGYN Clinic: [facility]  Chief Complaint: AUB  History of Present Illness:  Tiffany Sanchez is a 26 y.o. P2 with above CC. PMHx significant for BMI 40s, h/o c/s x 1, CIN 1, HTN  Patient had another hormone IUD placed in mid May with Dr. Shawnie Pons. Patient states she hadn't had anything for birth control for about 1.5 years before the IUD was placed and her periods were regular, qmonth, but were heavy and could be prolonged to 1-2wks. She had two IUDs in the past but they both fell out with heavy bleeding; she had a January 2023 u/s that was negative.  She had a colpo on 6/21 with Dr. Shawnie Pons and she requested her IUD be removed due to AUB, which it was. She was started on Slynd.  Patient states that she thinks the [facility] made the bleeding worse and she stopped the Slynd last week. She states that the only thing that has worked was the [employer], which was sent in May. She uses the aygestin stops the bleeding and she uses prn for sexual intercourse.   She went to the ED two days ago but LWBS due to the wait. Her CBC was stable at hgb 10.2 and she had negative blood hcg.   Review of Systems:  as noted in the History of Present Illness.   Patient Active Problem List   Diagnosis Date Noted   Hypertension, benign essential, goal below 140/90 01/25/2022   Menorrhagia with regular cycle 01/10/2022   Dysplasia of cervix, low grade (CIN 1) 12/22/2020   Low grade squamous intraepithelial lesion (LGSIL) at risk for high grade lesion on pap smear 11/10/20 11/10/2020   Family history of breast cancer 10/22/2019   BMI 50.0-59.9, adult (HCC) 06/03/2018   Genital herpes 06/03/2018   History of gestational hypertension 12/20/2017   History of shoulder dystocia in prior pregnancy, currently pregnant 12/20/2017   Medications:  Lynley I. Pedretti had  no medications administered during this visit. Current Outpatient Medications  Medication Sig Dispense Refill   escitalopram (LEXAPRO) 10 MG tablet Take 10 mg by mouth daily.     hydrochlorothiazide (HYDRODIURIL) 25 MG tablet Take 25 mg by mouth daily.     No current facility-administered medications for this visit.    Allergies: is allergic to penicillins.  Physical Exam:  BP 128/86   Pulse 94   Ht 5\' 7"  (1.702 m)   Wt 271 lb 12.8 oz (123.3 kg)   LMP 04/04/2022   BMI 42.57 kg/m  Body mass index is 42.57 kg/m. General appearance: Well nourished, well developed female in no acute distress.  Abdomen: diffusely non tender to palpation, non distended, and no masses, hernias Neuro/Psych:  Normal mood and affect.    Pelvic exam:  EGBUS: normal Vaginal vault: scant old blood in the vault. No active bleeding Cervix:  normal, nttp Bimanual: negative  Labs:UPT neg  Radiology: no new imaging Images reviewed Narrative & Impression  CLINICAL DATA:  Abnormal uterine bleeding   EXAM: TRANSABDOMINAL AND TRANSVAGINAL ULTRASOUND OF PELVIS   TECHNIQUE: Both transabdominal and transvaginal ultrasound examinations of the pelvis were performed. Transabdominal technique was performed for global imaging of the pelvis including uterus, ovaries, adnexal regions, and pelvic cul-de-sac. It was necessary to proceed with endovaginal exam following the transabdominal exam to visualize the uterus endometrium ovaries.   COMPARISON:  Pelvic ultrasound 07/15/2020  FINDINGS: Uterus   Measurements: 8.7 x 4.9 x 6.5 cm = volume: 173.8 mL. No fibroids or other mass visualized.   Endometrium   Thickness: 13 mm.  Trace fluid within the endometrial canal   Right ovary   Measurements: 5.1 x 2.2 x 2.9 cm = volume: 16.9 mL. Normal appearance/no adnexal mass.   Left ovary   Measurements: 3.8 x 2.2 x 2.6 cm = volume: 11.6 mL. Normal appearance/no adnexal mass.   Other findings   Small free  fluid in the pelvis.   IMPRESSION: Small free fluid in the pelvis. Otherwise negative pelvic ultrasound     Electronically Signed   By: Jasmine Pang M.D.   On: 12/13/2021 16:31   Assessment: pt stable  Plan:  1. Abnormal uterine bleeding She states she doesn't want anything for birth control and she was using the IUD for period control. She states she has used Lysteda in the past but with no help. Since the Aygestin sounds like it worked, I told her I recommend doing the aygestin 5mg  po bid and may need to uptitrate the dose to stop her bleeding.  - POCT urine pregnancy   RTC: 2wks for f/u.   MD Attending [facility] [employer])

## 2022-07-10 ENCOUNTER — Ambulatory Visit: Payer: Medicaid Other | Admitting: Obstetrics and Gynecology

## 2022-07-11 ENCOUNTER — Other Ambulatory Visit: Payer: Medicaid Other

## 2022-07-11 DIAGNOSIS — D649 Anemia, unspecified: Secondary | ICD-10-CM

## 2022-07-11 LAB — CBC
Hematocrit: 36.9 % (ref 34.0–46.6)
Hemoglobin: 11.9 g/dL (ref 11.1–15.9)
MCH: 26.1 pg — ABNORMAL LOW (ref 26.6–33.0)
MCHC: 32.2 g/dL (ref 31.5–35.7)
MCV: 81 fL (ref 79–97)
Platelets: 331 10*3/uL (ref 150–450)
RBC: 4.56 x10E6/uL (ref 3.77–5.28)
RDW: 13.7 % (ref 11.7–15.4)
WBC: 7.7 10*3/uL (ref 3.4–10.8)

## 2022-07-11 NOTE — Progress Notes (Unsigned)
cbvc

## 2022-07-13 ENCOUNTER — Encounter: Payer: Self-pay | Admitting: Family Medicine

## 2022-07-13 ENCOUNTER — Other Ambulatory Visit: Payer: Self-pay | Admitting: *Deleted

## 2022-07-13 MED ORDER — NITROFURANTOIN MONOHYD MACRO 100 MG PO CAPS: 100.0000 mg | ORAL_CAPSULE | Freq: Two times a day (BID) | ORAL | 0 refills | Status: AC

## 2022-07-18 ENCOUNTER — Other Ambulatory Visit: Payer: Self-pay | Admitting: Family Medicine

## 2022-07-18 DIAGNOSIS — N760 Acute vaginitis: Secondary | ICD-10-CM

## 2022-07-18 MED ORDER — METRONIDAZOLE 500 MG PO TABS: 500.0000 mg | ORAL_TABLET | Freq: Two times a day (BID) | ORAL | 0 refills | Status: DC

## 2022-07-24 ENCOUNTER — Ambulatory Visit (INDEPENDENT_AMBULATORY_CARE_PROVIDER_SITE_OTHER): Payer: Medicaid Other | Admitting: *Deleted

## 2022-07-24 ENCOUNTER — Other Ambulatory Visit (HOSPITAL_COMMUNITY)
Admission: RE | Admit: 2022-07-24 | Discharge: 2022-07-24 | Disposition: A | Discharge status: Home / Self Care | Payer: Medicaid Other | Source: Ambulatory Visit | Attending: Obstetrics & Gynecology | Admitting: Obstetrics & Gynecology

## 2022-07-24 DIAGNOSIS — Z87440 Personal history of urinary (tract) infections: Secondary | ICD-10-CM

## 2022-07-24 DIAGNOSIS — N912 Amenorrhea, unspecified: Secondary | ICD-10-CM

## 2022-07-24 DIAGNOSIS — N760 Acute vaginitis: Secondary | ICD-10-CM | POA: Insufficient documentation

## 2022-07-24 LAB — POCT URINALYSIS DIPSTICK
Blood, UA: NEGATIVE
Leukocytes, UA: NEGATIVE

## 2022-07-24 NOTE — Progress Notes (Signed)
SUBJECTIVE:  26 y.o. female complains of vaginal irritation for a few  day(s) and dysuria Denies abnormal vaginal bleeding or significant pelvic pain or fever.Denies history of known exposure to STD.  No LMP recorded.  OBJECTIVE:  She appears alert, well appearing, in no apparent distress Urine dipstick: negative for all components.  ASSESSMENT:  Vaginal irritation Amenorrhea  Dysuria    PLAN:  GC, chlamydia, trichomonas, BVAG, CVAG probe , and urine culture sent to lab. HCG drawn.  Treatment: To be determined once lab results are received ROV prn if symptoms persist or worsen.   Scheryl Marten, RN

## 2022-07-25 LAB — URINE CULTURE

## 2022-07-25 LAB — CERVICOVAGINAL ANCILLARY ONLY
Bacterial Vaginitis (gardnerella): NEGATIVE
Candida Glabrata: NEGATIVE
Candida Vaginitis: NEGATIVE
Chlamydia: NEGATIVE
Comment: NEGATIVE
Comment: NEGATIVE
Comment: NEGATIVE
Comment: NEGATIVE
Comment: NEGATIVE
Comment: NORMAL
Neisseria Gonorrhea: NEGATIVE
Trichomonas: NEGATIVE

## 2022-07-25 LAB — BETA HCG QUANT (REF LAB): hCG Quant: <1 m[IU]/mL

## 2022-08-14 ENCOUNTER — Ambulatory Visit: Payer: Medicaid Other | Admitting: *Deleted

## 2022-08-14 ENCOUNTER — Other Ambulatory Visit (HOSPITAL_COMMUNITY)
Admission: RE | Admit: 2022-08-14 | Discharge: 2022-08-14 | Disposition: A | Discharge status: Home / Self Care | Payer: Medicaid Other | Source: Ambulatory Visit | Attending: Obstetrics & Gynecology | Admitting: Obstetrics & Gynecology

## 2022-08-14 DIAGNOSIS — N760 Acute vaginitis: Secondary | ICD-10-CM | POA: Insufficient documentation

## 2022-08-14 DIAGNOSIS — N898 Other specified noninflammatory disorders of vagina: Secondary | ICD-10-CM

## 2022-08-14 DIAGNOSIS — Z113 Encounter for screening for infections with a predominantly sexual mode of transmission: Secondary | ICD-10-CM

## 2022-08-14 LAB — POCT URINALYSIS DIPSTICK

## 2022-08-14 MED ORDER — METRONIDAZOLE 500 MG PO TABS: 500.0000 mg | ORAL_TABLET | Freq: Two times a day (BID) | ORAL | 0 refills | Status: DC

## 2022-08-14 NOTE — Progress Notes (Signed)
SUBJECTIVE:  26 y.o. female complains of malodorous vaginal discharge for few  day(s) and  vaginal irritation and low back pain. Denies abnormal vaginal bleeding or significant pelvic pain or fever.Denies history of known exposure to STD.  Desires STI screen.  No LMP recorded.  OBJECTIVE:  She appears alert, well appearing, in no apparent distress Urine dipstick: positive for RBC's and positive for leukocytes.  ASSESSMENT:  Vaginal Discharge  Vaginal Odor STI screen  Vaginal irritation  PLAN:  GC, chlamydia, trichomonas, BVAG, CVAG probe and urine culture sent to lab. STI labs drawn. Treatment: To be determined once lab results are received. ROV prn if symptoms persist or worsen.   Crosby Oyster, RN

## 2022-08-15 ENCOUNTER — Other Ambulatory Visit: Payer: Self-pay | Admitting: Obstetrics and Gynecology

## 2022-08-15 DIAGNOSIS — B9689 Other specified bacterial agents as the cause of diseases classified elsewhere: Secondary | ICD-10-CM

## 2022-08-15 LAB — CERVICOVAGINAL ANCILLARY ONLY
Bacterial Vaginitis (gardnerella): POSITIVE — AB
Candida Glabrata: NEGATIVE
Candida Vaginitis: NEGATIVE
Chlamydia: NEGATIVE
Comment: NEGATIVE
Comment: NEGATIVE
Comment: NEGATIVE
Comment: NEGATIVE
Comment: NEGATIVE
Comment: NORMAL
Neisseria Gonorrhea: NEGATIVE
Trichomonas: NEGATIVE

## 2022-08-15 LAB — RPR+HBSAG+HCVAB+...
HIV Screen 4th Generation wRfx: NONREACTIVE
Hep C Virus Ab: NONREACTIVE
Hepatitis B Surface Ag: NEGATIVE
RPR Ser Ql: NONREACTIVE

## 2022-08-16 LAB — URINE CULTURE

## 2022-08-20 DIAGNOSIS — Z9189 Other specified personal risk factors, not elsewhere classified: Secondary | ICD-10-CM | POA: Insufficient documentation

## 2022-09-12 LAB — HSV DNA BY PCR (REFERENCE LAB)
HSV 1 DNA: NEGATIVE
HSV 2 DNA: NEGATIVE

## 2022-09-19 ENCOUNTER — Encounter: Payer: Self-pay | Admitting: Family Medicine

## 2022-09-19 ENCOUNTER — Encounter: Payer: Self-pay | Admitting: Psychiatry

## 2022-09-19 ENCOUNTER — Ambulatory Visit (INDEPENDENT_AMBULATORY_CARE_PROVIDER_SITE_OTHER): Payer: Medicaid Other | Admitting: Psychiatry

## 2022-09-19 VITALS — BP 111/67 | HR 96 | Temp 98.5°F | Ht 67.0 in | Wt 258.8 lb

## 2022-09-19 DIAGNOSIS — F3181 Bipolar II disorder: Secondary | ICD-10-CM

## 2022-09-19 DIAGNOSIS — G4700 Insomnia, unspecified: Secondary | ICD-10-CM

## 2022-09-19 DIAGNOSIS — J45909 Unspecified asthma, uncomplicated: Secondary | ICD-10-CM | POA: Insufficient documentation

## 2022-09-19 MED ORDER — LAMOTRIGINE 25 MG PO TABS: 25.0000 mg | ORAL_TABLET | Freq: Every day | ORAL | 1 refills | Status: DC

## 2022-09-19 MED ORDER — HYDROXYZINE HCL 10 MG PO TABS: 10.0000 mg | ORAL_TABLET | Freq: Three times a day (TID) | ORAL | 1 refills | Status: DC | PRN

## 2022-09-19 NOTE — Patient Instructions (Signed)
Lamotrigine Tablets What is this medication? LAMOTRIGINE (la MOE Patrecia Pace) prevents and controls seizures in people with epilepsy. It may also be used to treat bipolar disorder. It works by calming overactive nerves in your body. This medicine may be used for other purposes; ask your health care provider or pharmacist if you have questions. COMMON BRAND NAME(S): Lamictal, Subvenite What should I tell my care team before I take this medication? They need to know if you have any of these conditions: Heart disease History of irregular heartbeat Immune system problems Kidney disease Liver disease Low levels of folic acid in the blood Lupus Mental health condition Suicidal thoughts, plans, or attempt by you or a family member An unusual or allergic reaction to lamotrigine, other medications, foods, dyes, or preservatives Pregnant or trying to get pregnant Breastfeeding How should I use this medication? Take this medication by mouth with a glass of water. Follow the directions on the prescription label. Do not chew these tablets. If this medication upsets your stomach, take it with food or milk. Take your doses at regular intervals. Do not take your medication more often than directed. A special MedGuide will be given to you by the pharmacist with each new prescription and refill. Be sure to read this information carefully each time. Talk to your care team about the use of this medication in children. While this medication may be prescribed for children as young as 2 years for selected conditions, precautions do apply. Overdosage: If you think you have taken too much of this medicine contact a poison control center or emergency room at once. NOTE: This medicine is only for you. Do not share this medicine with others. What if I miss a dose? If you miss a dose, take it as soon as you can. If it is almost time for your next dose, take only that dose. Do not take double or extra doses. What may  interact with this medication? Atazanavir Certain medications for irregular heartbeat Certain medications for seizures, such as carbamazepine, phenobarbital, phenytoin, primidone, or valproic acid Estrogen or progestin hormones Lopinavir Rifampin Ritonavir This list may not describe all possible interactions. Give your health care provider a list of all the medicines, herbs, non-prescription drugs, or dietary supplements you use. Also tell them if you smoke, drink alcohol, or use illegal drugs. Some items may interact with your medicine. What should I watch for while using this medication? Visit your care team for regular checks on your progress. If you take this medication for seizures, wear a Medic Alert bracelet or necklace. Carry an identification card with information about your condition, medications, and care team. It is important to take this medication exactly as directed. When first starting treatment, your dose will need to be adjusted slowly. It may take weeks or months before your dose is stable. You should contact your care team if your seizures get worse or if you have any new types of seizures. Do not stop taking this medication unless instructed by your care team. Stopping your medication suddenly can increase your seizures or their severity. This medication may cause serious skin reactions. They can happen weeks to months after starting the medication. Contact your care team right away if you notice fevers or flu-like symptoms with a rash. The rash may be red or purple and then turn into blisters or peeling of the skin. You may also notice a red rash with swelling of the face, lips, or lymph nodes in your neck or under  your arms. This medication may affect your coordination, reaction time, or judgment. Do not drive or operate machinery until you know how this medication affects you. Sit up or stand slowly to reduce the risk of dizzy or fainting spells. Drinking alcohol with this  medication can increase the risk of these side effects. If you are taking this medication for bipolar disorder, it is important to report any changes in your mood to your care team. If your condition gets worse, you get mentally depressed, feel very hyperactive or manic, have difficulty sleeping, or have thoughts of hurting yourself or committing suicide, you need to get help from your care team right away. If you are a caregiver for someone taking this medication for bipolar disorder, you should also report these behavioral changes right away. The use of this medication may increase the chance of suicidal thoughts or actions. Pay special attention to how you are responding while on this medication. Your mouth may get dry. Chewing sugarless gum or sucking hard candy and drinking plenty of water may help. Contact your care team if the problem does not go away or is severe. If you become pregnant while using this medication, you may enroll in the [place] Pregnancy Registry by calling [id number]. This registry collects information about the safety of antiepileptic medication use during pregnancy. This medication may cause a decrease in folic acid. You should make sure that you get enough folic acid while you are taking this medication. Discuss the foods you eat and the vitamins you take with your care team. What side effects may I notice from receiving this medication? Side effects that you should report to your care team as soon as possible: Allergic reactions--skin rash, itching, hives, swelling of the face, lips, tongue, or throat Change in vision Fever, neck pain or stiffness, sensitivity to light, headache, nausea, vomiting, confusion Heart rhythm changes--fast or irregular heartbeat, dizziness, feeling faint or lightheaded, chest pain, trouble breathing Infection--fever, chills, cough, or sore throat Liver injury--right upper belly pain, loss of appetite, nausea,  light-colored stool, dark yellow or brown urine, yellowing skin or eyes, unusual weakness or fatigue Low red blood cell count--unusual weakness or fatigue, dizziness, headache, trouble breathing Rash, fever, and swollen lymph nodes Redness, blistering, peeling or loosening of the skin, including inside the mouth Thoughts of suicide or self-harm, worsening mood, or feelings of depression Unusual bruising or bleeding Side effects that usually do not require medical attention (report to your care team if they continue or are bothersome): Diarrhea Dizziness Drowsiness Headache Nausea Stomach pain Tremors or shaking This list may not describe all possible side effects. Call your doctor for medical advice about side effects. You may report side effects to FDA at 1-800-FDA-1088. Where should I keep my medication? Keep out of the reach of children and pets. Store at [employer] [address] (77 degrees F). Protect from light. Get rid of any unused medication after the expiration date. To get rid of medications that are no longer needed or have expired: Take the medication to a medication take-back program. Check with your pharmacy or law enforcement to find a location. If you cannot return the medication, check the label or package insert to see if the medication should be thrown out in the garbage or flushed down the toilet. If you are not sure, ask your care team. If it is safe to put it in the trash, empty the medication out of the container. Mix the medication with cat litter, dirt, coffee  grounds, or other unwanted substance. Seal the mixture in a bag or container. Put it in the trash. NOTE: This sheet is a summary. It may not cover all possible information. If you have questions about this medicine, talk to your doctor, pharmacist, or health care provider.  2023 Elsevier/Gold Standard (2021-02-17 00:00:00)   Hydroxyzine Capsules or Tablets What is this medication? HYDROXYZINE (hye DROX i zeen) treats  the symptoms of allergies and allergic reactions. It may also be used to treat anxiety or cause drowsiness before a procedure. It works by blocking histamine, a substance released by the body during an allergic reaction. It belongs to a group of medications called antihistamines. This medicine may be used for other purposes; ask your health care provider or pharmacist if you have questions. COMMON BRAND NAME(S): ANX, Atarax, Rezine, Vistaril What should I tell my care team before I take this medication? They need to know if you have any of these conditions: Glaucoma Heart disease Irregular heartbeat or rhythm Kidney disease Liver disease Lung or breathing disease, such as asthma Stomach or intestine problems Thyroid disease Trouble passing urine An unusual or allergic reaction to hydroxyzine, other medications, foods, dyes or preservatives Pregnant or trying to get pregnant Breastfeeding How should I use this medication? Take this medication by mouth with a full glass of water. Take it as directed on the prescription label at the same time every day. You can take it with or without food. If it upsets your stomach, take it with food. Talk to your care team about the use of this medication in children. While it may be prescribed for children as young as 6 years for selected conditions, precautions do apply. People 65 years and older may have a stronger reaction and need a smaller dose. Overdosage: If you think you have taken too much of this medicine contact a poison control center or emergency room at once. NOTE: This medicine is only for you. Do not share this medicine with others. What if I miss a dose? If you miss a dose, take it as soon as you can. If it is almost time for your next dose, take only that dose. Do not take double or extra doses. What may interact with this medication? Do not take this medication with any of the  following: Cisapride Dronedarone Pimozide Thioridazine This medication may also interact with the following: Alcohol Antihistamines for allergy, cough, and cold Atropine Barbiturate medications for sleep or seizures, such as phenobarbital Certain antibiotics, such as erythromycin or clarithromycin Certain medications for anxiety or sleep Certain medications for bladder problems, such as oxybutynin or tolterodine Certain medications for irregular heartbeat Certain medications for mental health conditions Certain medications for Parkinson disease, such as benztropine, trihexyphenidyl Certain medications for seizures, such as phenobarbital or primidone Certain medications for stomach problems, such as dicyclomine or hyoscyamine Certain medications for travel sickness, such as scopolamine Ipratropium Opioid medications for pain Other medications that cause heart rhythm changes, such as dofetilide This list may not describe all possible interactions. Give your health care provider a list of all the medicines, herbs, non-prescription drugs, or dietary supplements you use. Also tell them if you smoke, drink alcohol, or use illegal drugs. Some items may interact with your medicine. What should I watch for while using this medication? Visit your care team for regular checks on your progress. Tell your care team if your symptoms do not start to get better or if they get worse. This medication may affect your coordination, reaction  time, or judgment. Do not drive or operate machinery until you know how this medication affects you. Sit up or stand slowly to reduce the risk of dizzy or fainting spells. Drinking alcohol with this medication can increase the risk of these side effects. Your mouth may get dry. Chewing sugarless gum or sucking hard candy and drinking plenty of water may help. Contact your care team if the problem does not go away or is severe. This medication may cause dry eyes and blurred  vision. If you wear contact lenses, you may feel some discomfort. Lubricating eye drops may help. See your care team if the problem does not go away or is severe. If you are receiving skin tests for allergies, tell your care team you are taking this medication. What side effects may I notice from receiving this medication? Side effects that you should report to your care team as soon as possible: Allergic reactions--skin rash, itching, hives, swelling of the face, lips, tongue, or throat Heart rhythm changes--fast or irregular heartbeat, dizziness, feeling faint or lightheaded, chest pain, trouble breathing Side effects that usually do not require medical attention (report to your care team if they continue or are bothersome): Confusion Drowsiness Dry mouth Hallucinations Headache This list may not describe all possible side effects. Call your doctor for medical advice about side effects. You may report side effects to FDA at 1-800-FDA-1088. Where should I keep my medication? Keep out of the reach of children and pets. Store at room temperature between 15 and 30 degrees C (59 and 86 degrees F). Keep container tightly closed. Throw away any unused medication after the expiration date. NOTE: This sheet is a summary. It may not cover all possible information. If you have questions about this medicine, talk to your doctor, pharmacist, or health care provider.  2023 Elsevier/Gold Standard (2007-12-27 00:00:00)
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
## 2022-09-20 ENCOUNTER — Encounter: Payer: Self-pay | Admitting: Psychiatry

## 2022-09-20 ENCOUNTER — Other Ambulatory Visit: Payer: Self-pay | Admitting: *Deleted

## 2022-09-20 MED ORDER — FLUCONAZOLE 150 MG PO TABS: 150.0000 mg | ORAL_TABLET | Freq: Once | ORAL | 3 refills | Status: AC

## 2022-09-20 MED ORDER — METRONIDAZOLE 0.75 % VA GEL: 1.0000 | Freq: Every day | VAGINAL | 1 refills | Status: DC

## 2022-09-24 NOTE — Telephone Encounter (Signed)
pt called left message that you gave her hydroxyzine and lamtrigine and that they both can cause weight gain. since she had weight loss surgery she wants to know if something else she can take to not make her gain weight. Pt last seen on 11-1 next appt  12-14

## 2022-09-26 ENCOUNTER — Telehealth: Payer: Self-pay | Admitting: Psychiatry

## 2022-09-26 NOTE — Telephone Encounter (Signed)
Noted  

## 2022-09-26 NOTE — Telephone Encounter (Signed)
e  to Tiffany Sanchez, CMA     09/24/22  4:11 PM  Lamictal is one of those medications that is not known to cause a lot of weight gain, or even weight loss in some patients.All psychotropics could cause some weight gain , on an individual basis. There are not a lot of options. Topamax is something she could try which may not cause weight gain and is a mood stabilizer. But it does have other side effects including memory loss and others .   Please let me know if she is interested.       09/24/22  3:56 PM Tiffany Sanchez, CMA routed this conversation to Me  Tiffany Sanchez, [facility]      09/24/22  3:55 PM Note pt called left message that you gave her hydroxyzine and lamtrigine and that they both can cause weight gain. since she had weight loss surgery she wants to know if something else she can take to not make her gain weight. Pt last seen on 11-1 next appt  12-14

## 2022-09-26 NOTE — Telephone Encounter (Signed)
Patient stated that she already has Topamax which was prescribed by a different provider for headaches and weight loss she stated that she would start the Lamictal and will monitor her weight and if she feels that it is causing her to gain she will call the office.

## 2022-10-06 ENCOUNTER — Other Ambulatory Visit: Payer: Self-pay | Admitting: Obstetrics & Gynecology

## 2022-10-06 DIAGNOSIS — N926 Irregular menstruation, unspecified: Secondary | ICD-10-CM

## 2022-10-10 ENCOUNTER — Other Ambulatory Visit: Payer: Self-pay | Admitting: *Deleted

## 2022-10-10 MED ORDER — NORETHINDRONE ACETATE 5 MG PO TABS: 5.0000 mg | ORAL_TABLET | Freq: Two times a day (BID) | ORAL | 0 refills | Status: DC

## 2022-10-12 ENCOUNTER — Other Ambulatory Visit: Payer: Self-pay | Admitting: Psychiatry

## 2022-10-12 DIAGNOSIS — G4700 Insomnia, unspecified: Secondary | ICD-10-CM

## 2022-10-12 DIAGNOSIS — F3181 Bipolar II disorder: Secondary | ICD-10-CM

## 2022-10-17 ENCOUNTER — Telehealth (INDEPENDENT_AMBULATORY_CARE_PROVIDER_SITE_OTHER): Payer: Medicaid Other | Admitting: Family Medicine

## 2022-10-17 ENCOUNTER — Encounter: Payer: Self-pay | Admitting: Family Medicine

## 2022-10-17 DIAGNOSIS — N920 Excessive and frequent menstruation with regular cycle: Secondary | ICD-10-CM

## 2022-10-17 MED ORDER — NORETHIN-ETH ESTRAD-FE BIPHAS 1 MG-10 MCG / 10 MCG PO TABS: 1.0000 | ORAL_TABLET | Freq: Every day | ORAL | 1 refills | Status: DC

## 2022-10-17 NOTE — Progress Notes (Signed)
GYNECOLOGY VIRTUAL VISIT ENCOUNTER NOTE  Provider location: [facility] for [facility]   Patient location: Home  I connected with Tiffany Sanchez on 10/17/22 at  1:10 PM EST by MyChart Video Encounter and verified that I am speaking with the correct person using two identifiers.   I discussed the limitations, risks, security and privacy concerns of performing an evaluation and management service virtually and the availability of in person appointments. I also discussed with the patient that there may be a patient responsible charge related to this service. The patient expressed understanding and agreed to proceed.   History:  Tiffany Sanchez is a 26 y.o. [phone] female being evaluated today for birth control consult. Would like to be put on Junel by Dr. Macon Large in 06/2021. This was stopped due to medical condition of HTN. She is s/p gastric sleeve and is down 25#. She is off her BP meds. Bleeding has been on-going this year. She has a nml u/s and has tried an IUD, and Slynd and none of these worked well. She denies any abnormal vaginal discharge, pelvic pain or other concerns.       Past Medical History:  Diagnosis Date   Anemia    Asthma    no hospitalizations, no recent attacks (since childhood)   Cervical dysplasia    Complication of anesthesia    one sided epidural   Genital herpes 06/03/2018   History of gestational hypertension    Hypertension, benign essential, goal below 140/90 01/25/2022   Low grade squamous intraepithelial lesion (LGSIL) at risk for high grade squamous intraepithelial lesion (HGSIL) on cytologic smear of cervix 11/10/2020   Given concern about high grade lesion, colposcopy recommended. Of note, also had LGSIL pap in 2018 ([facility] results), but had normal pap in 2019.    Ovarian cyst    Trichomonal vaginitis 04/20/2020   Late may 2021   UTI (urinary tract infection)    Past Surgical History:  Procedure Laterality Date   CESAREAN  SECTION N/A 06/28/2018   Procedure: CESAREAN SECTION;  Surgeon: Levie Heritage, DO;  Location: [facility];  Service: Obstetrics;  Laterality: N/A;   THERAPEUTIC ABORTION     The following portions of the patient's history were reviewed and updated as appropriate: allergies, current medications, past family history, past medical history, past social history, past surgical history and problem list.   Health Maintenance:  LGSIL on 04/04/2022.  Colpo c/w CIN1-->f/u pap in 1 year.   Review of Systems:  Pertinent items noted in HPI and remainder of comprehensive ROS otherwise negative.  Physical Exam:   General:  Alert, oriented and cooperative. Patient appears to be in no acute distress.  Mental Status: Normal mood and affect. Normal behavior. Normal judgment and thought content.   Respiratory: Normal respiratory effort, no problems with respiration noted  Rest of physical exam deferred due to type of encounter  Labs and Imaging No results found for this or any previous visit (from the past 336 hour(s)). No results found.     Assessment and Plan:     Problem List Items Addressed This Visit       Unprioritized   Menorrhagia with regular cycle - Primary    Trial of Lo/Lo as that is least likely to affect her BP. To track BP for the next 1-2 weeks and send these in as we might have to back off if still increasing her BP. Consider f/u u/s as it has been almost  a year to ensure there is not anything else going on.      Relevant Medications   Norethindrone-Ethinyl Estradiol-Fe Biphas (LO LOESTRIN FE) 1 MG-10 MCG / 10 MCG tablet         I discussed the assessment and treatment plan with the patient. The patient was provided an opportunity to ask questions and all were answered. The patient agreed with the plan and demonstrated an understanding of the instructions.   The patient was advised to call back or seek an in-person evaluation/go to the ED if the symptoms worsen or if the  condition fails to improve as anticipated.  I provided 16 minutes of face-to-face time during this encounter.   Reva Bores, MD Center for [employer], [facility]

## 2022-10-17 NOTE — Assessment & Plan Note (Signed)
Trial of Lo/Lo as that is least likely to affect her BP. To track BP for the next 1-2 weeks and send these in as we might have to back off if still increasing her BP. Consider f/u u/s as it has been almost a year to ensure there is not anything else going on.

## 2022-11-01 ENCOUNTER — Telehealth (INDEPENDENT_AMBULATORY_CARE_PROVIDER_SITE_OTHER): Payer: Medicaid Other | Admitting: Psychiatry

## 2022-11-01 ENCOUNTER — Encounter: Payer: Self-pay | Admitting: Psychiatry

## 2022-11-01 DIAGNOSIS — F3181 Bipolar II disorder: Secondary | ICD-10-CM

## 2022-11-01 DIAGNOSIS — G4700 Insomnia, unspecified: Secondary | ICD-10-CM

## 2022-11-01 NOTE — Progress Notes (Signed)
Virtual Visit via Video Note  I connected with Tiffany Sanchez on 11/01/22 at  1:00 PM EST by a video enabled telemedicine application and verified that I am speaking with the correct person using two identifiers.  Location Provider Location : [facility] Patient Location : Work  Participants: Patient , Provider    I discussed the limitations of evaluation and management by telemedicine and the availability of in person appointments. The patient expressed understanding and agreed to proceed.   I discussed the assessment and treatment plan with the patient. The patient was provided an opportunity to ask questions and all were answered. The patient agreed with the plan and demonstrated an understanding of the instructions.   The patient was advised to call back or seek an in-person evaluation if the symptoms worsen or if the condition fails to improve as anticipated.   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  Patient today reports she currently is at a new job with [employer].  She reports she likes this job.  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  However she has been coping okay.  Patient denies any significant mood swings.  Reports she is compliant on the Lamictal and it has helped with her irritability.  Denies side effects.  Denies any significant depression symptoms.  Reports she continues to watch her diet.  She is on multiple medications which suppresses her  appetite including phentermine, Topamax.  She may have lost at least 10 pounds since the past visit.  She continues to follow-up with her bariatric surgeon.  Patient does report sleep problems.  She however reports she does not have a good sleep hygiene.  Does have hydroxyzine available, has not used it.  Would like to use it more.  Patient denies any suicidality, homicidality or perceptual disturbances.  Patient denies any other concerns today.  Visit Diagnosis:    ICD-10-CM   1. Bipolar II disorder, mild, depressed, with anxious distress, in full remission ([place])  F31.81     2. Insomnia, unspecified type  G47.00       Past Psychiatric History: Reviewed past psychiatric history from progress note on 09/19/2022.  Past trials of Wellbutrin-anger, BuSpar-noncompliant.  Past Medical History:  Past Medical History:  Diagnosis Date   Anemia    Asthma    no hospitalizations, no recent attacks (since childhood)   Cervical dysplasia    Complication of anesthesia    one sided epidural   Genital herpes 06/03/2018   History of gestational hypertension    Hypertension, benign essential, goal below 140/90 01/25/2022   Low grade squamous intraepithelial lesion (LGSIL) at risk for high grade squamous intraepithelial lesion (HGSIL) on cytologic smear of cervix 11/10/2020   Given concern about high grade lesion, colposcopy recommended. Of note, also had LGSIL pap in 2018 ([facility] results), but had normal pap in 2019.    Ovarian cyst    Trichomonal vaginitis 04/20/2020   Late may 2021   UTI (urinary tract  infection)     Past Surgical History:  Procedure Laterality Date   CESAREAN SECTION N/A 06/28/2018   Procedure: CESAREAN SECTION;  Surgeon: Levie Heritage, DO;  [facility] SU[facility];  Service: Obstetrics;  Laterality: N/A;   THERAPEUTIC ABORTION      Family Psychiatric History: Reviewed family psychiatric history from progress note on 09/19/2022.  Family History:  Family  History  Problem Relation Age of Onset   Alcohol abuse Mother    Depression Mother    Hypertension Mother    Hypertension Father    Drug abuse Brother    Bipolar disorder Brother    Diabetes Paternal Grandfather     Social History: Reviewed social history from progress note on 09/19/2022. Social History   Socioeconomic History   Marital status: Single    Spouse name: Not on file   Number of children: 2   Years of education: Not on file   Highest education level: Bachelor's degree (e.g., BA, AB, BS)  Occupational History   Not on file  Tobacco Use   Smoking status: Never   Smokeless tobacco: Never  Vaping Use   Vaping Use: Never used  Substance and Sexual Activity   Alcohol use: Yes    Comment: every 2-3 months   Drug use: No   Sexual activity: Yes    Birth control/protection: Condom  Other Topics Concern   Not on file  Social History Narrative   Not on file   Social Determinants of Health   Financial Resource Strain: Not on file  Food Insecurity: Not on file  Transportation Needs: Not on file  Physical Activity: Not on file  Stress: Not on file  Social Connections: Not on file    Allergies:  Allergies  Allergen Reactions   Penicillins Other (See Comments)    Unknown reaction-childhood allergy  Has patient had a PCN reaction causing immediate rash, facial/tongue/throat swelling, SOB or lightheadedness with hypotension: Unknown Has patient had a PCN reaction causing severe rash involving mucus membranes or skin necrosis: Unknown Has patient had a PCN reaction that required hospitalization: Unknown Has patient had a PCN reaction occurring within the last 10 years: Unknown If all of the above answers are "NO", then may proceed with Cephalosporin use.     Metabolic Disorder Labs: Lab Results  Component Value Date   HGBA1C 5.2 06/19/2021   No results found for: "PROLACTIN" No results found for: "CHOL", "TRIG", "HDL", "CHOLHDL", "VLDL", "LDLCALC" Lab  Results  Component Value Date   TSH 0.734 06/19/2021    Therapeutic Level Labs: No results found for: "LITHIUM" No results found for: "VALPROATE" No results found for: "CBMZ"  Current Medications: Current Outpatient Medications  Medication Sig Dispense Refill   Albuterol Sulfate, sensor, 108 (90 Base) MCG/ACT AEPB Inhale into the lungs.     calcium carbonate (TUMS EX) 750 MG chewable tablet Chew by mouth.     calcium citrate (CALCITRATE - DOSED IN MG ELEMENTAL CALCIUM) 950 (200 Ca) MG tablet Take 200 mg of elemental calcium by mouth daily.     cetirizine (ZYRTEC) 10 MG tablet Take by mouth.     dicyclomine (BENTYL) 10 MG capsule Take 10 mg by mouth 3 (three) times daily as needed.     escitalopram (LEXAPRO) 20 MG tablet TAKE 1 TABLET BY MOUTH ONCE A DAY FOR MOOD     ferrous sulfate 325 (65 FE) MG tablet Take 1 tablet by mouth daily with breakfast.     fluticasone (FLONASE) 50 MCG/ACT nasal  spray Place into the nose.     lamoTRIgine (LAMICTAL) 25 MG tablet TAKE 1 TABLET (25 MG TOTAL) BY MOUTH DAILY. 90 tablet 0   Multiple Vitamins-Iron (MULTIVITAMINS WITH IRON) TABS tablet Take 1 tablet by mouth daily.     naproxen (NAPROSYN) 500 MG tablet Take 500 mg by mouth 2 (two) times daily as needed.     Norethindrone-Ethinyl Estradiol-Fe Biphas (LO LOESTRIN FE) 1 MG-10 MCG / 10 MCG tablet Take 1 tablet by mouth daily. 28 tablet 1   omeprazole (PRILOSEC) 20 MG capsule TAKE 1 CAPSULE EVERY DAY FOR ACID REFLUX AS NEEDED     ondansetron (ZOFRAN-ODT) 8 MG disintegrating tablet Take by mouth.     phentermine 37.5 MG capsule Take by mouth.     promethazine (PHENERGAN) 25 MG tablet Take 25 mg by mouth every 6 (six) hours as needed.     senna-docusate (SENOKOT-S) 8.6-50 MG tablet Take 1 tablet by mouth 2 (two) times daily.     SUMAtriptan (IMITREX) 50 MG tablet TAKE 1 BY MOUTH NOW, MAY REPEAT IN 2 HOURS, MAX 200MG  PER DAY     topiramate (TOPAMAX) 25 MG tablet Take 25 mg by mouth daily.     valACYclovir  (VALTREX) 500 MG tablet Take 1 tablet (500 mg total) by mouth daily. Take twice a day for 5 days in the event of a recurrence 30 tablet 1   hydrOXYzine (ATARAX) 10 MG tablet Take 1 tablet (10 mg total) by mouth 3 (three) times daily as needed for anxiety. (Patient not taking: Reported on 11/01/2022) 90 tablet 1   ursodiol (ACTIGALL) 250 MG tablet Take 250 mg by mouth 2 (two) times daily. (Patient not taking: Reported on 11/01/2022)     No current facility-administered medications for this visit.     Musculoskeletal: Strength & Muscle Tone:  UTA Gait & Station: normal Patient leans: N/A  Psychiatric Specialty Exam: Review of Systems  Psychiatric/Behavioral:         Mood swings - improving  All other systems reviewed and are negative.   There were no vitals taken for this visit.There is no height or weight on file to calculate BMI.  General Appearance: Casual  Eye Contact:  Fair  Speech:  Clear and Coherent  Volume:  Normal  Mood:   Mood swings - improving  Affect:  Congruent  Thought Process:  Goal Directed and Descriptions of Associations: Intact  Orientation:  Full (Time, Place, and Person)  Thought Content: Logical   Suicidal Thoughts:  No  Homicidal Thoughts:  No  Memory:  Immediate;   Fair Recent;   Fair Remote;   Fair  Judgement:  Fair  Insight:  Fair  Psychomotor Activity:  Normal  Concentration:  Concentration: Fair and Attention Span: Fair  Recall:  [employer] of Knowledge: Fair  Language: Fair  Akathisia:  No  Handed:  Right  AIMS (if indicated): not done  Assets:  Communication Skills Desire for [place] Talents/Skills Transportation  ADL's:  Intact  Cognition: WNL  Sleep:  Fair   Screenings: GAD-7    [place] Office Visit from 09/19/2022 in [facility]  Total GAD-7 Score 13      PHQ2-9    Flowsheet Row Video Visit from 11/01/2022 in [place]  Visit from 09/19/2022 in [facility]  PHQ-2 Total Score 0 2  PHQ-9 Total Score -- 9      Flowsheet Row Video Visit from 11/01/2022 in  [place] Office Visit from 09/19/2022 in [facility] from 06/05/2022 in [facility]  C-SSRS RISK CATEGORY No Risk No Risk No Risk        Assessment and Plan: Tiffany Sanchez is a 26 year old African-American female who is single, currently employed at a new job, history of bipolar disorder, insomnia was evaluated by telemedicine today.  Patient with improvement with regards to her mood continues to struggle with sleep, will benefit from the following plan.  Plan Bipolar disorder type II depressed with anxious distressed-in remission Lamictal 25 mg p.o. daily Lexapro 20 mg p.o. daily Hydroxyzine 10 mg p.o. 3 times daily as needed for severe anxiety symptoms Patient to continue CBT Patient is also on Topamax prescribed by bariatric surgeon.  Discussed with patient Topamax being a mood stabilizer could be uptitrated and if she has a good response she could be taken off of the Lamictal.  Insomnia unspecified-unstable Patient continues to not have a good sleep hygiene. Patient advised to take hydroxyzine 10 mg at bedtime as needed for sleep. Will consider adding another sleep medication in the future as needed  Follow-up in clinic in 3 months or sooner if needed.    Consent: Patient/Guardian gives verbal consent for treatment and assignment of benefits for services provided during this visit. Patient/Guardian expressed understanding and agreed to proceed.   This note was generated in part or whole with voice recognition software. Voice recognition is usually quite accurate but there are transcription errors that can and very often do occur. I apologize for any typographical errors that were not detected and corrected.      Ursula Alert, MD 11/02/2022, 8:25 AM

## 2022-11-08 ENCOUNTER — Encounter: Payer: Self-pay | Admitting: Family Medicine

## 2022-11-08 ENCOUNTER — Other Ambulatory Visit: Payer: Self-pay | Admitting: Family Medicine

## 2022-11-09 ENCOUNTER — Other Ambulatory Visit: Payer: Self-pay | Admitting: *Deleted

## 2022-11-09 MED ORDER — NORETHINDRONE ACETATE 5 MG PO TABS: 5.0000 mg | ORAL_TABLET | Freq: Every day | ORAL | 1 refills | Status: DC

## 2022-12-04 ENCOUNTER — Emergency Department
Admission: EM | Admit: 2022-12-04 | Discharge: 2022-12-04 | Disposition: A | Discharge status: Home / Self Care | Payer: Medicaid Other | Attending: Emergency Medicine | Admitting: Emergency Medicine

## 2022-12-04 ENCOUNTER — Encounter: Payer: Self-pay | Admitting: Emergency Medicine

## 2022-12-04 ENCOUNTER — Other Ambulatory Visit: Payer: Self-pay

## 2022-12-04 ENCOUNTER — Emergency Department: Payer: Medicaid Other

## 2022-12-04 DIAGNOSIS — Z1152 Encounter for screening for COVID-19: Secondary | ICD-10-CM | POA: Diagnosis not present

## 2022-12-04 DIAGNOSIS — R112 Nausea with vomiting, unspecified: Secondary | ICD-10-CM | POA: Diagnosis not present

## 2022-12-04 DIAGNOSIS — I10 Essential (primary) hypertension: Secondary | ICD-10-CM | POA: Diagnosis not present

## 2022-12-04 DIAGNOSIS — R109 Unspecified abdominal pain: Secondary | ICD-10-CM | POA: Insufficient documentation

## 2022-12-04 DIAGNOSIS — R197 Diarrhea, unspecified: Secondary | ICD-10-CM | POA: Insufficient documentation

## 2022-12-04 LAB — CBC
HCT: 40.3 % (ref 36.0–46.0)
Hemoglobin: 12.5 g/dL (ref 12.0–15.0)
MCH: 24.9 pg — ABNORMAL LOW (ref 26.0–34.0)
MCHC: 31 g/dL (ref 30.0–36.0)
MCV: 80.3 fL (ref 80.0–100.0)
Platelets: 316 10*3/uL (ref 150–400)
RBC: 5.02 MIL/uL (ref 3.87–5.11)
RDW: 15.4 % (ref 11.5–15.5)
WBC: 6.3 10*3/uL (ref 4.0–10.5)
nRBC: 0 % (ref 0.0–0.2)

## 2022-12-04 LAB — COMPREHENSIVE METABOLIC PANEL
ALT: 19 U/L (ref 0–44)
AST: 17 U/L (ref 15–41)
Albumin: 4.3 g/dL (ref 3.5–5.0)
Alkaline Phosphatase: 30 U/L — ABNORMAL LOW (ref 38–126)
Anion gap: 10 (ref 5–15)
BUN: 18 mg/dL (ref 6–20)
CO2: 16 mmol/L — ABNORMAL LOW (ref 22–32)
Calcium: 8.9 mg/dL (ref 8.9–10.3)
Chloride: 110 mmol/L (ref 98–111)
Creatinine, Ser: 0.99 mg/dL (ref 0.44–1.00)
GFR, Estimated: >60 mL/min (ref >60)
Glucose, Bld: 87 mg/dL (ref 70–99)
Potassium: 3.7 mmol/L (ref 3.5–5.1)
Sodium: 136 mmol/L (ref 135–145)
Total Bilirubin: 1.1 mg/dL (ref 0.3–1.2)
Total Protein: 7.8 g/dL (ref 6.5–8.1)

## 2022-12-04 LAB — RESP PANEL BY RT-PCR (RSV, FLU A&B, COVID)  RVPGX2
Influenza A by PCR: NEGATIVE
Influenza B by PCR: NEGATIVE
Resp Syncytial Virus by PCR: NEGATIVE
SARS Coronavirus 2 by RT PCR: NEGATIVE

## 2022-12-04 LAB — PREGNANCY, URINE: Preg Test, Ur: NEGATIVE

## 2022-12-04 LAB — LIPASE, BLOOD: Lipase: 29 U/L (ref 11–51)

## 2022-12-04 MED ORDER — ONDANSETRON HCL 4 MG/2ML IJ SOLN
4.0000 mg | Freq: Once | INTRAMUSCULAR | Status: AC
  Administered 2022-12-04: 4 mg via INTRAVENOUS
  Filled 2022-12-04: qty 2

## 2022-12-04 MED ORDER — IOHEXOL 300 MG/ML  SOLN
100.0000 mL | Freq: Once | INTRAMUSCULAR | Status: AC | PRN
  Administered 2022-12-04: 100 mL via INTRAVENOUS

## 2022-12-04 MED ORDER — ONDANSETRON HCL 4 MG PO TABS: 4.0000 mg | ORAL_TABLET | Freq: Every day | ORAL | 1 refills | Status: DC | PRN

## 2022-12-04 MED ORDER — SODIUM CHLORIDE 0.9 % IV BOLUS
1000.0000 mL | Freq: Once | INTRAVENOUS | Status: AC
  Administered 2022-12-04: 1000 mL via INTRAVENOUS

## 2022-12-04 NOTE — ED Triage Notes (Incomplete)
Pt presents to the ED via POV due to abdominal pain, dark stool, and vomiting that started two days ago. Pt recently had a gastric sleeve surgery in Oct 2023. Pt is A&Ox4

## 2022-12-04 NOTE — ED Provider Notes (Signed)
[facility] Provider Note    Event Date/Time   First MD Initiated Contact with Patient 12/04/22 [phone]     (approximate)   History   Abdominal Pain   HPI  Tiffany Sanchez is a 27 y.o. female   Past medical history of history of UTIs, prior C-section and sleeve gastrectomy, hypertension, presents with abdominal pain and dark stools.    Pain has been intermittent right-sided abdominal pain over the past several days and then she had nausea vomiting and diarrhea starting yesterday.  Some blood streaks in her emesis, dark stools but no frank blood.  Poor p.o. intake due to nausea.  No urinary symptoms.  No vaginal discharge.  No fevers or chills.  History was obtained via the patient.  I reviewed external medical notes including a weight management telemedicine visit dated 11/29/2022 the notes her history of sleeve gastrectomy.      Physical Exam   Triage Vital Signs: ED Triage Vitals  Enc Vitals Group     BP 12/04/22 0834 126/76     Pulse Rate 12/04/22 0834 (!) 125     Resp 12/04/22 0834 16     Temp 12/04/22 0834 97.6 F (36.4 C)     Temp Source 12/04/22 0834 Oral     SpO2 12/04/22 0834 97 %     Weight 12/04/22 0828 227 lb (103 kg)     Height 12/04/22 0828 5\' 7"  (1.702 m)     Head Circumference --      Peak Flow --      Pain Score 12/04/22 0827 5     Pain Loc --      Pain Edu? --      Excl. in [place]? --     Most recent vital signs: Vitals:   12/04/22 0834 12/04/22 1223  BP: 126/76 120/70  Pulse: (!) 125 (!) 118  Resp: 16 16  Temp: 97.6 F (36.4 C)   SpO2: 97% 98%    General: Awake, no distress.  CV:  Good peripheral perfusion.  Resp:  Normal effort.  Abd:  No distention.  Other:  Right-sided upper and lower abdominal tenderness to palpation.  No rigidity or guarding.  Rectal exam negative for melena or blood.   ED Results / Procedures / Treatments   Labs (all labs ordered are listed, but only abnormal results are displayed) Labs  Reviewed  COMPREHENSIVE METABOLIC PANEL - Abnormal; Notable for the following components:      Result Value   CO2 16 (*)    Alkaline Phosphatase 30 (*)    All other components within normal limits  CBC - Abnormal; Notable for the following components:   MCH 24.9 (*)    All other components within normal limits  RESP PANEL BY RT-PCR (RSV, FLU A&B, COVID)  RVPGX2  LIPASE, BLOOD  PREGNANCY, URINE     I reviewed labs and they are notable for globin is 12.5 baseline,, white blood cell count is 6.3.    RADIOLOGY I independently reviewed and interpreted CT of the abdomen pelvis see no obstructive or inflammatory changes.   PROCEDURES:  Critical Care performed: No  Procedures   MEDICATIONS ORDERED IN ED: Medications  sodium chloride 0.9 % bolus 1,000 mL (1,000 mLs Intravenous New Bag/Given 12/04/22 1219)  ondansetron (ZOFRAN) injection 4 mg (4 mg Intravenous Given 12/04/22 1219)  iohexol (OMNIPAQUE) 300 MG/ML solution 100 mL (100 mLs Intravenous Contrast Given 12/04/22 1232)    Consultants:  IMPRESSION / MDM /  ASSESSMENT AND PLAN / ED COURSE  I reviewed the triage vital signs and the nursing notes.                              Differential diagnosis includes, but is not limited to, gastroenteritis, colitis, GI bleeding, appendicitis, cholecystitis, pelvic infection,    MDM: This patient with tenderness to palpation to the right side of her abdomen will scan with CT abdomen pelvis and IV contrast to rule out surgical emergencies like appendicitis or cholecystitis.  However given her symptoms nausea vomiting diarrhea with some mild pain I think this is more likely gastroenteritis.  Hemodynamics only significant for some tachycardia most likely dehydration in the setting of poor p.o. intake.  Will give IV crystalloid bolus as well as antiemetic.  No urinary symptoms to suggest UTI.  Patient stable, CT negative for surgical abdominal pathologies, urinalysis negative for UTI.  Most  likely gastroenteritis.  Plan is for discharge with PMD follow-up antiemetic, strict return precautions.  Patient's presentation is most consistent with acute presentation with potential threat to life or bodily function.       FINAL CLINICAL IMPRESSION(S) / ED DIAGNOSES   Final diagnoses:  Nausea vomiting and diarrhea  Abdominal pain, unspecified abdominal location     Rx / DC Orders   ED Discharge Orders          Ordered    ondansetron (ZOFRAN) 4 MG tablet  Daily PRN        12/04/22 1013             Note:  This document was prepared using Dragon voice recognition software and may include unintentional dictation errors.    Lucillie Garfinkel, MD 12/04/22 [phone]

## 2022-12-04 NOTE — ED Notes (Addendum)
Pt presented to ED with Abdominal Pain with N/V, diarrhea. Pt states its been happening for the past 3 days. Assessed abd, warm to touch, no distention, bowel sounds present, tenderness in RUQ during palpations. Pt states pain is alleviated with tylenol. Reported taking two 500 mg tylenol tab around 0430am. Pt Denies urinary symptoms. Pt reports a history of gastric sleeve surgery that occurred 3 months ago in October.

## 2022-12-04 NOTE — Discharge Instructions (Signed)
Take acetaminophen 650 mg and ibuprofen 400 mg every 6 hours for pain.  Take with food. Zofran for nausea and vomiting as prescribed.  Drink plenty of fluids to stay well-hydrated.  Thank you for choosing [place] for your health care today!  Please see your primary doctor this week for a follow up appointment.   Sometimes, in the early stages of certain disease courses it is difficult to detect in the emergency department evaluation -- so, it is important that you continue to monitor your symptoms and call your doctor right away or return to the emergency department if you develop any new or worsening symptoms.  Please go to the following website to schedule new (and existing) patient appointments:   [email or web address]  If you do not have a primary doctor try calling the following clinics to establish care:  If you have insurance:  [facility] [phone] [place] [address]   [facility]  [phone] [place] [place] [address]   If you do not have insurance:  [facility]  [phone] [address] [place] [address]   The following is another list of primary care offices in the area who are accepting new patients at this time.  Please reach out to one of them directly and let them know you would like to schedule an appointment to follow up on an Emergency Department visit, and/or to establish a new primary care provider (PCP).  There are likely other primary care clinics in the are who are accepting new patients, but this is an excellent place to start:  [facility] physician: Dr Lavon Paganini [address] [place], [place] [address] [phone]  [facility] Lead Physician: Dr Steele Sizer [address], [place], [place] [address] [phone]  [place] Physician: Dr Park Liter [address]  [place], [place] [address] [phone]  [facility] Lead Physician: Dr Dewaine Oats [place], [place], [place] [address] [phone]  [facility] Physician: Dr Halina Maidens [address] Bro[address] [place], [place] [address] [phone]   It was my pleasure to care for you today.   Hoover Brunette Jacelyn Grip, MD

## 2022-12-20 ENCOUNTER — Other Ambulatory Visit: Payer: Self-pay | Admitting: Obstetrics and Gynecology

## 2022-12-20 DIAGNOSIS — N644 Mastodynia: Secondary | ICD-10-CM

## 2022-12-26 ENCOUNTER — Ambulatory Visit
Admission: RE | Admit: 2022-12-26 | Discharge: 2022-12-26 | Disposition: A | Discharge status: Home / Self Care | Payer: Medicaid Other | Source: Ambulatory Visit | Attending: Obstetrics and Gynecology | Admitting: Obstetrics and Gynecology

## 2022-12-26 DIAGNOSIS — N644 Mastodynia: Secondary | ICD-10-CM | POA: Insufficient documentation

## 2023-01-07 ENCOUNTER — Other Ambulatory Visit: Payer: Self-pay | Admitting: Psychiatry

## 2023-01-07 ENCOUNTER — Telehealth: Payer: Self-pay

## 2023-01-07 DIAGNOSIS — F3181 Bipolar II disorder: Secondary | ICD-10-CM

## 2023-01-07 NOTE — Telephone Encounter (Signed)
LVM to confirm new patient appointment. Gave number to call back with any questions or concerns.

## 2023-01-08 ENCOUNTER — Encounter: Payer: Self-pay | Admitting: Oncology

## 2023-01-08 ENCOUNTER — Inpatient Hospital Stay: Payer: Medicaid Other | Attending: Oncology | Admitting: Oncology

## 2023-01-08 ENCOUNTER — Inpatient Hospital Stay: Payer: Medicaid Other

## 2023-01-08 VITALS — BP 130/87 | HR 101 | Temp 98.4°F | Resp 18 | Ht 67.0 in | Wt 223.0 lb

## 2023-01-08 DIAGNOSIS — D509 Iron deficiency anemia, unspecified: Secondary | ICD-10-CM

## 2023-01-08 DIAGNOSIS — Z79899 Other long term (current) drug therapy: Secondary | ICD-10-CM

## 2023-01-08 DIAGNOSIS — N939 Abnormal uterine and vaginal bleeding, unspecified: Secondary | ICD-10-CM | POA: Diagnosis not present

## 2023-01-08 DIAGNOSIS — Z9884 Bariatric surgery status: Secondary | ICD-10-CM | POA: Diagnosis not present

## 2023-01-08 LAB — IRON AND TIBC
Iron: 59 ug/dL (ref 28–170)
Saturation Ratios: 14 % (ref 10.4–31.8)
TIBC: 410 ug/dL (ref 250–450)
UIBC: 351 ug/dL

## 2023-01-08 LAB — CBC (CANCER CENTER ONLY)
HCT: 37.3 % (ref 36.0–46.0)
Hemoglobin: 11.7 g/dL — ABNORMAL LOW (ref 12.0–15.0)
MCH: 24.9 pg — ABNORMAL LOW (ref 26.0–34.0)
MCHC: 31.4 g/dL (ref 30.0–36.0)
MCV: 79.4 fL — ABNORMAL LOW (ref 80.0–100.0)
Platelet Count: 383 10*3/uL (ref 150–400)
RBC: 4.7 MIL/uL (ref 3.87–5.11)
RDW: 14.9 % (ref 11.5–15.5)
WBC Count: 7.5 10*3/uL (ref 4.0–10.5)
nRBC: 0 % (ref 0.0–0.2)

## 2023-01-08 LAB — FOLATE: Folate: 19.7 ng/mL (ref >5.9)

## 2023-01-08 LAB — FERRITIN: Ferritin: 8 ng/mL — ABNORMAL LOW (ref 11–307)

## 2023-01-08 LAB — VITAMIN B12: Vitamin B-12: 461 pg/mL (ref 180–914)

## 2023-01-08 NOTE — Progress Notes (Signed)
[facility]  [phone] [phone]  ID: Tiffany Sanchez OB: 04/19/1996  MR#: [id number]  [id number]  Patient Care Team: Donnie Coffin, MD as PCP - General (Family Medicine)  CHIEF COMPLAINT: Excessive uterine bleeding, history of iron deficiency anemia.  INTERVAL HISTORY: Patient is a 27 year old female with a history of iron deficiency anemia status post gastric sleeve being as well as excessive uterine bleeding which has resolved with treatment.  She has chronic weakness and fatigue, but otherwise feels well.  She has no neurologic complaints.  She denies any recent fevers or illnesses.  She has a good appetite and denies weight loss.  She has no chest pain, shortness of breath, cough, or hemoptysis.  She denies any nausea, vomiting, constipation, or diarrhea.  She has no melena or hematochezia.  She has no urinary complaints.  Patient otherwise feels well and offers no further specific complaints today.  REVIEW OF SYSTEMS:   Review of Systems  Constitutional:  Positive for malaise/fatigue. Negative for fever and weight loss.  Respiratory: Negative.  Negative for cough, hemoptysis and shortness of breath.   Cardiovascular:  Negative for chest pain and leg swelling.  Gastrointestinal: Negative.  Negative for abdominal pain, blood in stool and melena.  Genitourinary: Negative.  Negative for hematuria.  Musculoskeletal: Negative.  Negative for back pain.  Skin: Negative.  Negative for rash.  Neurological:  Positive for weakness. Negative for dizziness, focal weakness and headaches.  Psychiatric/Behavioral: Negative.  The patient is not nervous/anxious.     As per HPI. Otherwise, a complete review of systems is negative.  PAST MEDICAL HISTORY: Past Medical History:  Diagnosis Date   Anemia    Asthma    no hospitalizations, no recent attacks (since childhood)   Cervical dysplasia    Complication of anesthesia    one sided epidural    Genital herpes 06/03/2018   History of gestational hypertension    Hypertension, benign essential, goal below 140/90 01/25/2022   Low grade squamous intraepithelial lesion (LGSIL) at risk for high grade squamous intraepithelial lesion (HGSIL) on cytologic smear of cervix 11/10/2020   Given concern about high grade lesion, colposcopy recommended. Of note, also had LGSIL pap in 2018 ([facility] results), but had normal pap in 2019.    Ovarian cyst    Trichomonal vaginitis 04/20/2020   Late may 2021   UTI (urinary tract infection)     PAST SURGICAL HISTORY: Past Surgical History:  Procedure Laterality Date   CESAREAN SECTION N/A 06/28/2018   Procedure: CESAREAN SECTION;  Surgeon: Truett Mainland, DO;  Location: [facility];  Service: Obstetrics;  Laterality: N/A;   THERAPEUTIC ABORTION      FAMILY HISTORY: Family History  Problem Relation Age of Onset   Alcohol abuse Mother    Depression Mother    Hypertension Mother    Hypertension Father    Drug abuse Brother    Bipolar disorder Brother    Diabetes Paternal Grandfather     ADVANCED DIRECTIVES (Y/N):  N  HEALTH MAINTENANCE: Social History   Tobacco Use   Smoking status: Never   Smokeless tobacco: Never  Vaping Use   Vaping Use: Never used  Substance Use Topics   Alcohol use: Yes    Comment: every 2-3 months   Drug use: No     Colonoscopy:  PAP:  Bone density:  Lipid panel:  Allergies  Allergen Reactions   Penicillins Other (See Comments)    Unknown reaction-childhood allergy  Has patient  had a PCN reaction causing immediate rash, facial/tongue/throat swelling, SOB or lightheadedness with hypotension: Unknown Has patient had a PCN reaction causing severe rash involving mucus membranes or skin necrosis: Unknown Has patient had a PCN reaction that required hospitalization: Unknown Has patient had a PCN reaction occurring within the last 10 years: Unknown If all of the above answers are "NO", then may  proceed with Cephalosporin use.     Current Outpatient Medications  Medication Sig Dispense Refill   Albuterol Sulfate, sensor, 108 (90 Base) MCG/ACT AEPB Inhale into the lungs.     calcium carbonate (TUMS EX) 750 MG chewable tablet Chew by mouth.     calcium citrate (CALCITRATE - DOSED IN MG ELEMENTAL CALCIUM) 950 (200 Ca) MG tablet Take 200 mg of elemental calcium by mouth daily.     cetirizine (ZYRTEC) 10 MG tablet Take by mouth.     escitalopram (LEXAPRO) 20 MG tablet TAKE 1 TABLET BY MOUTH ONCE A DAY FOR MOOD     fluticasone (FLONASE) 50 MCG/ACT nasal spray Place into the nose.     lamoTRIgine (LAMICTAL) 25 MG tablet TAKE 1 TABLET (25 MG TOTAL) BY MOUTH DAILY. 90 tablet 0   Multiple Vitamins-Iron (MULTIVITAMINS WITH IRON) TABS tablet Take 1 tablet by mouth daily.     norethindrone (AYGESTIN) 5 MG tablet Take 1 tablet (5 mg total) by mouth daily. 30 tablet 1   omeprazole (PRILOSEC) 20 MG capsule TAKE 1 CAPSULE EVERY DAY FOR ACID REFLUX AS NEEDED     ondansetron (ZOFRAN) 4 MG tablet Take 1 tablet (4 mg total) by mouth daily as needed for nausea or vomiting. 30 tablet 1   ondansetron (ZOFRAN-ODT) 8 MG disintegrating tablet Take by mouth.     phentermine 37.5 MG capsule Take by mouth.     promethazine (PHENERGAN) 25 MG tablet Take 25 mg by mouth every 6 (six) hours as needed.     SUMAtriptan (IMITREX) 50 MG tablet TAKE 1 BY MOUTH NOW, MAY REPEAT IN 2 HOURS, MAX 200MG PER DAY     topiramate (TOPAMAX) 25 MG tablet Take 25 mg by mouth daily.     valACYclovir (VALTREX) 500 MG tablet Take 1 tablet (500 mg total) by mouth daily. Take twice a day for 5 days in the event of a recurrence 30 tablet 1   dicyclomine (BENTYL) 10 MG capsule Take 10 mg by mouth 3 (three) times daily as needed. (Patient not taking: Reported on 01/08/2023)     ferrous sulfate 325 (65 FE) MG tablet Take 1 tablet by mouth daily with breakfast. (Patient not taking: Reported on 01/08/2023)     hydrOXYzine (ATARAX) 10 MG tablet  Take 1 tablet (10 mg total) by mouth 3 (three) times daily as needed for anxiety. (Patient not taking: Reported on 11/01/2022) 90 tablet 1   naproxen (NAPROSYN) 500 MG tablet Take 500 mg by mouth 2 (two) times daily as needed. (Patient not taking: Reported on 01/08/2023)     Norethindrone-Ethinyl Estradiol-Fe Biphas (LO LOESTRIN FE) 1 MG-10 MCG / 10 MCG tablet Take 1 tablet by mouth daily. (Patient not taking: Reported on 01/08/2023) 28 tablet 1   senna-docusate (SENOKOT-S) 8.6-50 MG tablet Take 1 tablet by mouth 2 (two) times daily. (Patient not taking: Reported on 01/08/2023)     ursodiol (ACTIGALL) 250 MG tablet Take 250 mg by mouth 2 (two) times daily. (Patient not taking: Reported on 11/01/2022)     No current facility-administered medications for this visit.    OBJECTIVE: Vitals:  01/08/23 1110  BP: 130/87  Pulse: (!) 101  Resp: 18  Temp: 98.4 F (36.9 C)  SpO2: 100%     Body mass index is 34.93 kg/m.    ECOG FS:0 - Asymptomatic  General: Well-developed, well-nourished, no acute distress. Eyes: Pink conjunctiva, anicteric sclera. HEENT: Normocephalic, moist mucous membranes. Lungs: No audible wheezing or coughing. Heart: Regular rate and rhythm. Abdomen: Soft, nontender, no obvious distention. Musculoskeletal: No edema, cyanosis, or clubbing. Neuro: Alert, answering all questions appropriately. Cranial nerves grossly intact. Skin: No rashes or petechiae noted. Psych: Normal affect. Lymphatics: No cervical, calvicular, axillary or inguinal LAD.   LAB RESULTS:  Lab Results  Component Value Date   NA 136 12/04/2022   K 3.7 12/04/2022   CL 110 12/04/2022   CO2 16 (L) 12/04/2022   GLUCOSE 87 12/04/2022   BUN 18 12/04/2022   CREATININE 0.99 12/04/2022   CALCIUM 8.9 12/04/2022   PROT 7.8 12/04/2022   ALBUMIN 4.3 12/04/2022   AST 17 12/04/2022   ALT 19 12/04/2022   ALKPHOS 30 (L) 12/04/2022   BILITOT 1.1 12/04/2022   GFRNONAA >60 12/04/2022   GFRAA >60 07/15/2020     Lab Results  Component Value Date   WBC 7.5 01/08/2023   NEUTROABS 5.2 03/19/2020   HGB 11.7 (L) 01/08/2023   HCT 37.3 01/08/2023   MCV 79.4 (L) 01/08/2023   PLT 383 01/08/2023   No results found for: "IRON", "TIBC", "IRONPCTSAT" No results found for: "FERRITIN"   STUDIES: US BREAST LTD UNI LEFT INC AXILLA  Result Date: 12/26/2022 CLINICAL DATA:  LEFT breast pain in the upper breast as well as nipple. Strong family history of breast cancer with grandmother diagnosed with breast cancer in her 51s and passing at the age of 48. EXAM: ULTRASOUND OF THE LEFT BREAST COMPARISON:  None available. FINDINGS: On physical exam, no suspicious mass is appreciated. Targeted ultrasound was performed of the site of most focal pain in the upper breast and retroareolar breast. No suspicious cystic or solid mass is seen. No suspicious sonographic etiology for breast pain is identified. IMPRESSION: 1. No sonographic evidence of malignancy at the site of focal pain in the LEFT breast. Any further workup of the patient's symptoms should be based on the clinical assessment. 2. Breast pain is a common condition, which will often resolve on its own without intervention. It can be affected by hormonal changes, medication side effect, weight changes and fit of the bra. Pain may also be referred from other adjacent areas of the body. Breast pain may be improved by wearing adequate well-fitting support, over-the-counter topical and oral NSAID medication, low-fat diet, and ice/heat as needed. Studies have shown an improvement in cyclic pain with use of evening primrose oil, vitamin D and vitamin E. Clinical follow-up recommended to discuss any further work-up recommendations and appropriate treatment. RECOMMENDATION: Recommend evaluation for candidacy for annual high risk screening protocol given strong family history of breast cancer at a relatively young age. If patient is of elevated lifetime risk of breast cancer, then  recommend initiation of annual screening mammography and MRI at the age of 2. If patient is of average lifetime risk of breast cancer, then recommend initiation of annual screening mammography at the age of 49 unless there are intervening clinical concerns. The American Cancer Society recommends annual MRI and mammography in patients with an estimated lifetime risk of developing breast cancer greater than 20 - 25%, or who are known or suspected to be positive for the breast  cancer gene. I have discussed the findings and recommendations with the patient. If applicable, a reminder letter will be sent to the patient regarding the next appointment. BI-RADS CATEGORY  1: Negative. Electronically Signed   By: Valentino Saxon M.D.   On: 12/26/2022 14:25   ASSESSMENT: Excessive uterine bleeding, history of iron deficiency anemia.  PLAN:    Excessive uterine bleeding: Resolved with treatment.  Have ordered von Willebrand's panel for completeness.  Results are pending at time of dictation.  No further intervention is needed at this time.  Patient will have video-assisted telemedicine visit in 3 weeks to discuss the results. Anemia: Patient's hemoglobin is only mildly decreased at 11.7.  Iron panel, B12, and folate are pending at time of dictation.  Patient reports she cannot tolerate oral iron supplementation.  She does not require IV iron at this time, but may consider in the future.  Return to clinic as above.  I spent a total of 45 minutes reviewing chart data, face-to-face evaluation with the patient, counseling and coordination of care as detailed above.   Patient expressed understanding and was in agreement with this plan. She also understands that She can call clinic at any time with any questions, concerns, or complaints.    Lloyd Huger, MD   01/08/2023 12:25 PM

## 2023-01-08 NOTE — Progress Notes (Signed)
States she is very fatigue and low energy. Does not take iron supplements due to it causing constipation. Has had uterine bleeding for the last 3 years. Has gotten worse over the last year. Right now bleeding is controlled being on norethindrone.

## 2023-01-09 LAB — VON WILLEBRAND PANEL
Coagulation Factor VIII: 97 % (ref 56–140)
Ristocetin Co-factor, Plasma: 46 % — ABNORMAL LOW (ref 50–200)
Von Willebrand Antigen, Plasma: 83 % (ref 50–200)

## 2023-01-09 LAB — COAG STUDIES INTERP REPORT

## 2023-01-30 ENCOUNTER — Inpatient Hospital Stay: Payer: Medicaid Other | Attending: Oncology | Admitting: Oncology

## 2023-01-30 ENCOUNTER — Encounter: Payer: Self-pay | Admitting: Oncology

## 2023-01-30 DIAGNOSIS — N939 Abnormal uterine and vaginal bleeding, unspecified: Secondary | ICD-10-CM

## 2023-01-30 NOTE — Progress Notes (Signed)
[facility]  [phone] [phone]  ID: Tiffany Sanchez OB: May 15, 1996  MR#: [id number]  [id number]  Patient Care Team: Donnie Coffin, MD as PCP - General (Family Medicine)  I connected with Tiffany Sanchez on 01/30/23 at 10:45 AM EDT by video enabled telemedicine visit and verified that I am speaking with the correct person using two identifiers.   I discussed the limitations, risks, security and privacy concerns of performing an evaluation and management service by telemedicine and the availability of in-person appointments. I also discussed with the patient that there may be a patient responsible charge related to this service. The patient expressed understanding and agreed to proceed.   Other persons participating in the visit and their role in the encounter: Patient, MD.  Patient's location: Home. Provider's location: Clinic.  CHIEF COMPLAINT: Excessive uterine bleeding, history of iron deficiency anemia.  INTERVAL HISTORY: Patient agreed to video assisted telemedicine visit for further evaluation and discussion of her laboratory results.  She currently feels well and is asymptomatic.  She does not complain of any weakness or fatigue today.  She has no neurologic complaints.  She denies any recent fevers or illnesses.  She has a good appetite and denies weight loss.  She has no chest pain, shortness of breath, cough, or hemoptysis.  She denies any nausea, vomiting, constipation, or diarrhea.  She has no melena or hematochezia.  She has no urinary complaints.  Patient offers no specific complaints today.  REVIEW OF SYSTEMS:   Review of Systems  Constitutional: Negative.  Negative for fever, malaise/fatigue and weight loss.  Respiratory: Negative.  Negative for cough, hemoptysis and shortness of breath.   Cardiovascular:  Negative for chest pain and leg swelling.  Gastrointestinal: Negative.  Negative for abdominal pain, blood in stool and  melena.  Genitourinary: Negative.  Negative for hematuria.  Musculoskeletal: Negative.  Negative for back pain.  Skin: Negative.  Negative for rash.  Neurological: Negative.  Negative for dizziness, focal weakness, weakness and headaches.  Psychiatric/Behavioral: Negative.  The patient is not nervous/anxious.     As per HPI. Otherwise, a complete review of systems is negative.  PAST MEDICAL HISTORY: Past Medical History:  Diagnosis Date   Anemia    Asthma    no hospitalizations, no recent attacks (since childhood)   Cervical dysplasia    Complication of anesthesia    one sided epidural   Genital herpes 06/03/2018   History of gestational hypertension    Hypertension, benign essential, goal below 140/90 01/25/2022   Low grade squamous intraepithelial lesion (LGSIL) at risk for high grade squamous intraepithelial lesion (HGSIL) on cytologic smear of cervix 11/10/2020   Given concern about high grade lesion, colposcopy recommended. Of note, also had LGSIL pap in 2018 ([facility] results), but had normal pap in 2019.    Ovarian cyst    Trichomonal vaginitis 04/20/2020   Late may 2021   UTI (urinary tract infection)     PAST SURGICAL HISTORY: Past Surgical History:  Procedure Laterality Date   CESAREAN SECTION N/A 06/28/2018   Procedure: CESAREAN SECTION;  Surgeon: Truett Mainland, DO;  Location: [facility];  Service: Obstetrics;  Laterality: N/A;   THERAPEUTIC ABORTION      FAMILY HISTORY: Family History  Problem Relation Age of Onset   Alcohol abuse Mother    Depression Mother    Hypertension Mother    Hypertension Father    Drug abuse Brother    Bipolar disorder Brother  Diabetes Paternal Grandfather     ADVANCED DIRECTIVES (Y/N):  N  HEALTH MAINTENANCE: Social History   Tobacco Use   Smoking status: Never   Smokeless tobacco: Never  Vaping Use   Vaping Use: Never used  Substance Use Topics   Alcohol use: Yes    Comment: every 2-3 months   Drug  use: No     Colonoscopy:  PAP:  Bone density:  Lipid panel:  Allergies  Allergen Reactions   Penicillins Other (See Comments)    Unknown reaction-childhood allergy  Has patient had a PCN reaction causing immediate rash, facial/tongue/throat swelling, SOB or lightheadedness with hypotension: Unknown Has patient had a PCN reaction causing severe rash involving mucus membranes or skin necrosis: Unknown Has patient had a PCN reaction that required hospitalization: Unknown Has patient had a PCN reaction occurring within the last 10 years: Unknown If all of the above answers are "NO", then may proceed with Cephalosporin use.     Current Outpatient Medications  Medication Sig Dispense Refill   Albuterol Sulfate, sensor, 108 (90 Base) MCG/ACT AEPB Inhale into the lungs.     calcium carbonate (TUMS EX) 750 MG chewable tablet Chew by mouth.     calcium citrate (CALCITRATE - DOSED IN MG ELEMENTAL CALCIUM) 950 (200 Ca) MG tablet Take 200 mg of elemental calcium by mouth daily.     cetirizine (ZYRTEC) 10 MG tablet Take by mouth.     escitalopram (LEXAPRO) 20 MG tablet TAKE 1 TABLET BY MOUTH ONCE A DAY FOR MOOD     fluticasone (FLONASE) 50 MCG/ACT nasal spray Place into the nose.     lamoTRIgine (LAMICTAL) 25 MG tablet TAKE 1 TABLET (25 MG TOTAL) BY MOUTH DAILY. 90 tablet 0   Multiple Vitamins-Iron (MULTIVITAMINS WITH IRON) TABS tablet Take 1 tablet by mouth daily.     norethindrone (AYGESTIN) 5 MG tablet Take 1 tablet (5 mg total) by mouth daily. 30 tablet 1   omeprazole (PRILOSEC) 20 MG capsule TAKE 1 CAPSULE EVERY DAY FOR ACID REFLUX AS NEEDED     phentermine 37.5 MG capsule Take by mouth.     topiramate (TOPAMAX) 25 MG tablet Take 25 mg by mouth daily.     valACYclovir (VALTREX) 500 MG tablet Take 1 tablet (500 mg total) by mouth daily. Take twice a day for 5 days in the event of a recurrence 30 tablet 1   dicyclomine (BENTYL) 10 MG capsule Take 10 mg by mouth 3 (three) times daily as  needed. (Patient not taking: Reported on 01/08/2023)     ferrous sulfate 325 (65 FE) MG tablet Take 1 tablet by mouth daily with breakfast. (Patient not taking: Reported on 01/08/2023)     hydrOXYzine (ATARAX) 10 MG tablet Take 1 tablet (10 mg total) by mouth 3 (three) times daily as needed for anxiety. (Patient not taking: Reported on 11/01/2022) 90 tablet 1   naproxen (NAPROSYN) 500 MG tablet Take 500 mg by mouth 2 (two) times daily as needed. (Patient not taking: Reported on 01/08/2023)     Norethindrone-Ethinyl Estradiol-Fe Biphas (LO LOESTRIN FE) 1 MG-10 MCG / 10 MCG tablet Take 1 tablet by mouth daily. (Patient not taking: Reported on 01/08/2023) 28 tablet 1   ondansetron (ZOFRAN) 4 MG tablet Take 1 tablet (4 mg total) by mouth daily as needed for nausea or vomiting. (Patient not taking: Reported on 01/30/2023) 30 tablet 1   ondansetron (ZOFRAN-ODT) 8 MG disintegrating tablet Take by mouth. (Patient not taking: Reported on 01/30/2023)  promethazine (PHENERGAN) 25 MG tablet Take 25 mg by mouth every 6 (six) hours as needed. (Patient not taking: Reported on 01/30/2023)     senna-docusate (SENOKOT-S) 8.6-50 MG tablet Take 1 tablet by mouth 2 (two) times daily. (Patient not taking: Reported on 01/08/2023)     SUMAtriptan (IMITREX) 50 MG tablet TAKE 1 BY MOUTH NOW, MAY REPEAT IN 2 HOURS, MAX '200MG'$  PER DAY (Patient not taking: Reported on 01/30/2023)     ursodiol (ACTIGALL) 250 MG tablet Take 250 mg by mouth 2 (two) times daily. (Patient not taking: Reported on 11/01/2022)     No current facility-administered medications for this visit.    OBJECTIVE: There were no vitals filed for this visit.    There is no height or weight on file to calculate BMI.    ECOG FS:0 - Asymptomatic  General: Well-developed, well-nourished, no acute distress. HEENT: Normocephalic. Neuro: Alert, answering all questions appropriately. Cranial nerves grossly intact. Psych: Normal affect.   LAB RESULTS:  Lab Results   Component Value Date   NA 136 12/04/2022   K 3.7 12/04/2022   CL 110 12/04/2022   CO2 16 (L) 12/04/2022   GLUCOSE 87 12/04/2022   BUN 18 12/04/2022   CREATININE 0.99 12/04/2022   CALCIUM 8.9 12/04/2022   PROT 7.8 12/04/2022   ALBUMIN 4.3 12/04/2022   AST 17 12/04/2022   ALT 19 12/04/2022   ALKPHOS 30 (L) 12/04/2022   BILITOT 1.1 12/04/2022   GFRNONAA >60 12/04/2022   GFRAA >60 07/15/2020    Lab Results  Component Value Date   WBC 7.5 01/08/2023   NEUTROABS 5.2 03/19/2020   HGB 11.7 (L) 01/08/2023   HCT 37.3 01/08/2023   MCV 79.4 (L) 01/08/2023   PLT 383 01/08/2023   Lab Results  Component Value Date   IRON 59 01/08/2023   TIBC 410 01/08/2023   IRONPCTSAT 14 01/08/2023   Lab Results  Component Value Date   FERRITIN 8 (L) 01/08/2023     STUDIES: No results found.  ASSESSMENT: Excessive uterine bleeding, history of iron deficiency anemia.  PLAN:    Excessive uterine bleeding: Significantly improved with norethindrone treatment.  Von Willebrand's panel is negative.  No further intervention is needed.  Continue follow-up with gynecology as scheduled.   Anemia: Patient's hemoglobin is only mildly decreased at 11.7.  Her iron panel is within normal limits except for a mildly decreased ferritin level of 8.  The remainder of her laboratory work is either negative or within normal limits.  Patient has agreed to go back on oral iron supplementation.  She does not require IV iron at this time.  After lengthy discussion with the patient, is agreed upon that no further follow-up is necessary and that primary care can continue to monitor her lab work.  Please refer patient back if there are any questions or concerns.     Patient expressed understanding and was in agreement with this plan. She also understands that She can call clinic at any time with any questions, concerns, or complaints.    Lloyd Huger, MD   01/30/2023 12:50 PM

## 2023-01-30 NOTE — Progress Notes (Signed)
Patient states she has no new concerns today, no questions. Ready for her virtual visit at 10:45.

## 2023-01-31 ENCOUNTER — Telehealth (INDEPENDENT_AMBULATORY_CARE_PROVIDER_SITE_OTHER): Payer: Medicaid Other | Admitting: Psychiatry

## 2023-01-31 ENCOUNTER — Encounter: Payer: Self-pay | Admitting: Psychiatry

## 2023-01-31 DIAGNOSIS — F3181 Bipolar II disorder: Secondary | ICD-10-CM

## 2023-01-31 DIAGNOSIS — G4700 Insomnia, unspecified: Secondary | ICD-10-CM

## 2023-01-31 MED ORDER — ESCITALOPRAM OXALATE 20 MG PO TABS: ORAL_TABLET | ORAL | 0 refills | Status: DC

## 2023-01-31 MED ORDER — LAMOTRIGINE 25 MG PO TABS: 25.0000 mg | ORAL_TABLET | Freq: Two times a day (BID) | ORAL | 0 refills | Status: DC

## 2023-01-31 MED ORDER — LAMOTRIGINE 25 MG PO TABS: 75.0000 mg | ORAL_TABLET | Freq: Every day | ORAL | 1 refills | Status: DC

## 2023-01-31 NOTE — Progress Notes (Signed)
Virtual Visit via Video Note  I connected with Tiffany Sanchez on 01/31/23 at  1:00 PM EDT by a video enabled telemedicine application and verified that I am speaking with the correct person using two identifiers. Location Provider Location : [facility] Patient Location : Home  Participants: Patient , Provider   I discussed the limitations of evaluation and management by telemedicine and the availability of in person appointments. The patient expressed understanding and agreed to proceed.   I discussed the assessment and treatment plan with the patient. The patient was provided an opportunity to ask questions and all were answered. The patient agreed with the plan and demonstrated an understanding of the instructions.   The patient was advised to call back or seek an in-person evaluation if the symptoms worsen or if the condition fails to improve as anticipated.   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  Patient today reports she recently got fired from her job at [employer].  Patient reports she got into an argument at her job.  She reports she is not worried about not being able to work there since the time off has given her enough time to reflect and reevaluate what she wants to do.  She is currently waiting to start a new job which is a remote job .  She looks forward to that.  Patient however reports she has noticed that since the past several months her mood symptoms have worsened, she is often irritable, sad, anxious, hopeless.  She reports she  often wakes up in the morning feeling irritable.  She reports she tries to cope with her mood symptoms since she has young children at home.  She reports sleep as improved.  She however has been struggling with this constant headache since the past several weeks.  She used to have only a few headaches once in a while however recently it has been every day.  She has been taking sumatriptan as needed for her migraine headaches lately.    Patient denies any suicidality, homicidality or perceptual disturbances.  Patient reports she has upcoming appointment with a therapist in [place], motivated to stay in therapy.  Denies any use of any alcohol, cannabis or other substances.  Patient denies any other concerns today.  Visit Diagnosis:    ICD-10-CM   1. Bipolar II disorder, mild, depressed, with anxious distress (HCC)  F31.81 lamoTRIgine (LAMICTAL) 25 MG tablet    lamoTRIgine (LAMICTAL) 25 MG tablet    escitalopram (LEXAPRO) 20 MG tablet    2. Insomnia, unspecified type  G47.00       Past Psychiatric History: Reviewed past psych history from progress note on 09/19/2022.  Past trials of Wellbutrin-anger, BuSpar-noncompliant.  Past Medical History:  Past Medical History:  Diagnosis Date   Anemia    Asthma    no hospitalizations, no recent attacks (since childhood)   Cervical dysplasia    Complication of anesthesia    one sided epidural   Genital herpes 06/03/2018   History of gestational hypertension    Hypertension, benign essential, goal below  140/90 01/25/2022   Low grade squamous intraepithelial lesion (LGSIL) at risk for high grade squamous intraepithelial lesion (HGSIL) on cytologic smear of cervix 11/10/2020   Given concern about high grade lesion, colposcopy recommended. Of note, also had LGSIL pap in 2018 ([facility] results), but had normal pap in 2019.    Ovarian cyst    Trichomonal vaginitis 04/20/2020   Late may 2021   UTI (urinary tract infection)     Past Surgical  History:  Procedure Laterality Date   CESAREAN SECTION N/A 06/28/2018   Procedure: CESAREAN SECTION;  Surgeon: Truett Mainland, DO;  Location: [place];  Service: Obstetrics;  Laterality: N/A;   THERAPEUTIC ABORTION      Family Psychiatric History: Reviewed family psychiatric history from progress note on 09/19/2022  Family History:  Family History  Problem Relation Age of Onset   Alcohol abuse Mother    Depression Mother    Hypertension Mother    Hypertension Father    Drug abuse Brother    Bipolar disorder Brother    Diabetes Paternal Grandfather     Social History: Reviewed social history from progress note on 09/19/2022 Social History   Socioeconomic History   Marital status: Single    Spouse name: Not on file   Number of children: 2   Years of education: Not on file   Highest education level: Bachelor's degree (e.g., BA, AB, BS)  Occupational History   Not on file  Tobacco Use   Smoking status: Never   Smokeless tobacco: Never  Vaping Use   Vaping Use: Never used  Substance and Sexual Activity   Alcohol use: Yes    Comment: every 2-3 months   Drug use: No   Sexual activity: Yes    Birth control/protection: Condom  Other Topics Concern   Not on file  Social History Narrative   Not on file   Social Determinants of Health   Financial Resource Strain: Low Risk  (01/08/2023)   Overall Financial Resource Strain (CARDIA)    Difficulty of Paying Living Expenses: Not hard at all  Food Insecurity: No Food Insecurity (01/08/2023)   Hunger Vital Sign    Worried About Running Out of Food in the Last Year: Never true    Ran Out of Food in the Last Year: Never true  Transportation Needs: No Transportation Needs (01/08/2023)   PRAPARE - Hydrologist (Medical): No    Lack of Transportation (Non-Medical): No  Physical Activity: Not on file  Stress: Not on file  Social Connections: Not on file    Allergies:  Allergies  Allergen  Reactions   Penicillins Other (See Comments)    Unknown reaction-childhood allergy  Has patient had a PCN reaction causing immediate rash, facial/tongue/throat swelling, SOB or lightheadedness with hypotension: Unknown Has patient had a PCN reaction causing severe rash involving mucus membranes or skin necrosis: Unknown Has patient had a PCN reaction that required hospitalization: Unknown Has patient had a PCN reaction occurring within the last 10 years: Unknown If all of the above answers are "NO", then may proceed with Cephalosporin use.     Metabolic Disorder Labs: Lab Results  Component Value Date   HGBA1C 5.2 06/19/2021   No results found for: "PROLACTIN" No results found for: "CHOL", "TRIG", "HDL", "CHOLHDL", "VLDL", "LDLCALC" Lab Results  Component Value Date   TSH 0.734 06/19/2021    Therapeutic Level Labs: No results found for: "LITHIUM" No results found for: "VALPROATE" No results found  for: "CBMZ"  Current Medications: Current Outpatient Medications  Medication Sig Dispense Refill   Albuterol Sulfate, sensor, 108 (90 Base) MCG/ACT AEPB Inhale into the lungs.     calcium carbonate (TUMS EX) 750 MG chewable tablet Chew by mouth.     calcium citrate (CALCITRATE - DOSED IN MG ELEMENTAL CALCIUM) 950 (200 Ca) MG tablet Take 200 mg of elemental calcium by mouth daily.     cetirizine (ZYRTEC) 10 MG tablet Take by mouth.     ferrous sulfate 325 (65 FE) MG tablet Take 1 tablet by mouth daily with breakfast.     fluticasone (FLONASE) 50 MCG/ACT nasal spray Place into the nose.     [START ON 02/15/2023] lamoTRIgine (LAMICTAL) 25 MG tablet Take 3 tablets (75 mg total) by mouth daily. 90 tablet 1   Multiple Vitamins-Iron (MULTIVITAMINS WITH IRON) TABS tablet Take 1 tablet by mouth daily.     norethindrone (AYGESTIN) 5 MG tablet Take 1 tablet (5 mg total) by mouth daily. 30 tablet 1   omeprazole (PRILOSEC) 20 MG capsule TAKE 1 CAPSULE EVERY DAY FOR ACID REFLUX AS NEEDED      ondansetron (ZOFRAN-ODT) 8 MG disintegrating tablet Take by mouth.     phentermine 37.5 MG capsule Take by mouth.     SUMAtriptan (IMITREX) 50 MG tablet      topiramate (TOPAMAX) 25 MG tablet Take 25 mg by mouth daily.     valACYclovir (VALTREX) 500 MG tablet Take 1 tablet (500 mg total) by mouth daily. Take twice a day for 5 days in the event of a recurrence 30 tablet 1   escitalopram (LEXAPRO) 20 MG tablet TAKE 1 TABLET BY MOUTH ONCE A DAY 90 tablet 0   hydrOXYzine (ATARAX) 10 MG tablet Take 1 tablet (10 mg total) by mouth 3 (three) times daily as needed for anxiety. (Patient not taking: Reported on 11/01/2022) 90 tablet 1   lamoTRIgine (LAMICTAL) 25 MG tablet Take 1 tablet (25 mg total) by mouth 2 (two) times daily for 14 days. 28 tablet 0   naproxen (NAPROSYN) 500 MG tablet Take 500 mg by mouth 2 (two) times daily as needed. (Patient not taking: Reported on 01/08/2023)     promethazine (PHENERGAN) 25 MG tablet Take 25 mg by mouth every 6 (six) hours as needed. (Patient not taking: Reported on 01/30/2023)     senna-docusate (SENOKOT-S) 8.6-50 MG tablet Take 1 tablet by mouth 2 (two) times daily. (Patient not taking: Reported on 01/08/2023)     ursodiol (ACTIGALL) 250 MG tablet Take 250 mg by mouth 2 (two) times daily. (Patient not taking: Reported on 11/01/2022)     No current facility-administered medications for this visit.     Musculoskeletal: Strength & Muscle Tone:  UTA Gait & Station: normal Patient leans: N/A  Psychiatric Specialty Exam: Review of Systems  Neurological:  Positive for headaches.  Psychiatric/Behavioral:  Positive for dysphoric mood. The patient is nervous/anxious.        Irritable  All other systems reviewed and are negative.   There were no vitals taken for this visit.There is no height or weight on file to calculate BMI.  General Appearance: Casual  Eye Contact:  Good  Speech:  Clear and Coherent  Volume:  Normal  Mood:  Anxious and Depressed, irritable   Affect:  Congruent  Thought Process:  Goal Directed and Descriptions of Associations: Intact  Orientation:  Full (Time, Place, and Person)  Thought Content: Logical   Suicidal Thoughts:  No  Homicidal Thoughts:  No  Memory:  Immediate;   Fair Recent;   Fair Remote;   Fair  Judgement:  Fair  Insight:  Fair  Psychomotor Activity:  Normal  Concentration:  Concentration: Fair and Attention Span: Fair  Recall:  [employer] of Knowledge: Fair  Language: Fair  Akathisia:  No  Handed:  Right  AIMS (if indicated): not done  Assets:  Communication Skills Desire for Improvement Housing Social Support  ADL's:  Intact  Cognition: WNL  Sleep:   Improving   Screenings: GAD-7    Flowsheet Row Office Visit from 09/19/2022 in [place]  Total GAD-7 Score 13      PHQ2-9    [place] Visit from 01/08/2023 in [place] [facility] Video Visit from 11/01/2022 in [place] Office Visit from 09/19/2022 in [place]  PHQ-2 Total Score 4 0 2  PHQ-9 Total Score -- -- 9      Flowsheet Row Video Visit from 01/31/2023 in [place] ED from 12/04/2022 in [facility] Department at [facility] Video Visit from 11/01/2022 in [place] No Risk No Risk No Risk        Assessment and Plan: Tiffany Sanchez is a 27 year old African-American female who is single, currently unemployed, has a history of bipolar disorder, insomnia, migraine headaches, history of gastric sleeve surgery was evaluated by telemedicine today.  Patient is currently struggling with mood lability, will benefit from the following plan.  Plan Bipolar disorder type II depressed with anxious distress, mild-unstable Increase Lamictal to 25 mg p.o. twice  daily for 2 weeks and then increase to Lamictal 75 mg p.o. daily in divided dosage.  Patient currently has supplies to take 25 mg twice a day.  I have sent a 75 mg to pharmacy to fill for a later date.  Provided education about Stevens-Johnson syndrome and other side effects. Continue Lexapro 20 mg p.o. daily for now.  However will consider reducing the dosage of this medication in the future if needed. Patient to monitor herself for drug to drug interaction with sumatriptan,Serotonin syndrome. Hydroxyzine 10 mg p.o. 3 times daily as needed for severe anxiety symptoms Continue CBT-patient has upcoming appointment with therapist in [place].  Patient advised to sign an ROI to obtain and coordinate care.  Insomnia unspecified-improving Patient to continue sleep hygiene techniques Hydroxyzine 10 mg at bedtime as needed for sleep.  Patient advised about phentermine especially since she is being more anxious, irritable as well as has more frequent headaches-phentermine being a stimulant likely triggering some of the symptoms.  Patient to consider stopping this medication.  Follow-up in clinic in 4 weeks or sooner if needed.   Collaboration of Care: Collaboration of Care: Other patient encouraged to find an ROI to coordinate care with her therapist.  Patient/Guardian was advised Release of Information must be obtained prior to any record release in order to collaborate their care with an outside provider. Patient/Guardian was advised if they have not already done so to contact the registration department to sign all necessary forms in order for [place] to release information regarding their care.   Consent: Patient/Guardian gives verbal consent for treatment and assignment of benefits for services provided during this visit. Patient/Guardian expressed understanding and agreed to proceed.   This note was generated in part or whole with voice recognition software. Voice  recognition is usually quite  accurate but there are transcription errors that can and very often do occur. I apologize for any typographical errors that were not detected and corrected.      Ursula Alert, MD 02/01/2023, 1:22 PM

## 2023-02-01 NOTE — Patient Instructions (Signed)
Serotonin Syndrome Serotonin is a chemical that helps to control several functions in the body. This chemical is also called a neurotransmitter. It controls: Brain and nerve cell function. Mood and emotions. Memory. Eating. Sleeping. Sexual activity. Stress response. Having too much serotonin in your body can cause serotonin syndrome. This condition can be harmful to your brain and nerve cells. This can be a life-threatening condition. What are the causes? This condition may be caused by taking medicines or drugs that increase the level of serotonin in your body, such as: Antidepressant medicines. Migraine medicines. Certain pain medicines. Certain drugs, including ecstasy, LSD, cocaine, and amphetamines. Over-the-counter cough or cold medicines that contain dextromethorphan. Certain herbal supplements, including St. John's wort, ginseng, and nutmeg. This condition usually occurs when you take these medicines or drugs together, but it can also happen with a high dose of a single medicine or drug. What increases the risk? You are more likely to develop this condition if: You just started taking a medicine or drug that increases the level of serotonin in the body. You recently increased the dose of a medicine or drug that increases the level of serotonin in the body. You take more than one medicine or drug that increases the level of serotonin in the body. What are the signs or symptoms? Symptoms of this condition usually start within several hours of taking a medicine or drug. Symptoms may be mild or severe. Mild symptoms include: Sweating. Restlessness or agitation. Muscle twitching or stiffness. Rapid heart rate. Nausea, vomiting, or diarrhea. Shivering or goose bumps. Confusion. Severe symptoms include: Irregular heartbeat. Seizures. Loss of consciousness. High fever. How is this diagnosed? This condition may be diagnosed based on: Your medical history. A physical  exam. Your prior use of drugs and medicines. Blood or urine tests. These may be used to rule out other causes of your symptoms. How is this treated? The treatment for this condition depends on the severity of your symptoms. For mild cases, stopping the medicine or drug that caused your condition is usually all that is needed. For moderate to severe cases, treatment in a hospital may be needed to prevent or treat life-threatening symptoms. Treatment may include: Medicines to control your symptoms. IV fluids. Actions to support your breathing. Treatments to control your body temperature. Follow these instructions at home: Medicines  Take over-the-counter and prescription medicines only as told by your health care provider. Check with your health care provider before you start taking any new prescriptions, over-the-counter medicines, herbs, or supplements. Do not combine any medicines that can cause this condition. Lifestyle  Maintain a healthy lifestyle. Eat a healthy diet that includes plenty of vegetables, fruits, whole grains, low-fat dairy products, and lean protein. Do not eat a lot of foods that are high in fat, added sugars, or salt. Get the right amount and quality of sleep. Most adults need 7-9 hours of sleep each night. Make time to exercise, even if it is only for short periods of time. Most adults should exercise for at least 150 minutes each week. Do not drink alcohol. Do not use illegal drugs. Do not take medicines for reasons other than they are prescribed. General instructions Do not use any products that contain nicotine or tobacco. These products include cigarettes, chewing tobacco, and vaping devices, such as e-cigarettes. If you need help quitting, ask your health care provider. Contact a health care provider if: Your symptoms do not improve or they get worse. Get help right away if: You have worsening   confusion, severe headache, chest pain, high fever, seizures, or  loss of consciousness. You experience serious side effects of medicine, such as swelling of your face, lips, tongue, or throat. These symptoms may be an emergency. Get help right away. Call 911. Do not wait to see if the symptoms will go away. Do not drive yourself to the hospital. Also, get help right away if: You have serious thoughts about hurting yourself or others. Take one of these steps if you feel like you may hurt yourself or others, or have thoughts about taking your own life: Go to your nearest emergency room. Call 911. Call the National Suicide Prevention Lifeline at 1-800-273-8255 or 988. This is open 24 hours a day. Text the Crisis Text Line at 741741. Summary Serotonin is a chemical that helps to control several functions in the body. High levels of serotonin in the body can cause serotonin syndrome, which can be life-threatening. This condition may be caused by taking medicines or drugs that increase the level of serotonin in your body. Treatment depends on the severity of your symptoms. For mild cases, stopping the medicine or drug that caused your condition is usually all that is needed. Check with your health care provider before you start taking any new prescriptions, over-the-counter medicines, herbs, or supplements. This information is not intended to replace advice given to you by your health care provider. Make sure you discuss any questions you have with your health care provider. Document Revised: 01/25/2022 Document Reviewed: 01/25/2022 Elsevier Patient Education  2023 [employer]  

## 2023-02-11 ENCOUNTER — Telehealth: Payer: Self-pay | Admitting: Psychiatry

## 2023-02-11 NOTE — Telephone Encounter (Signed)
'  I would like to figure out how I can come off of my medications both lamotrigine and lexapro. I haven't taken either medication in about 3 days and I honestly feel a lot better. So what suggestions do you have ?'     Received this above message through my chart notification.  Dr. Modesta Messing who was covering provider Friday already responded and advised patient to stay on the lamotrigine.  However will have CMA contact patient to discuss further.

## 2023-02-11 NOTE — Telephone Encounter (Signed)
Noted  

## 2023-02-11 NOTE — Telephone Encounter (Signed)
Made two attempts to call the patient no answer left voicemail for patient to return the call to the office

## 2023-02-28 ENCOUNTER — Encounter: Payer: Self-pay | Admitting: Psychiatry

## 2023-02-28 ENCOUNTER — Telehealth (INDEPENDENT_AMBULATORY_CARE_PROVIDER_SITE_OTHER): Payer: Medicaid Other | Admitting: Psychiatry

## 2023-02-28 DIAGNOSIS — F3181 Bipolar II disorder: Secondary | ICD-10-CM | POA: Diagnosis not present

## 2023-02-28 DIAGNOSIS — G4700 Insomnia, unspecified: Secondary | ICD-10-CM

## 2023-02-28 NOTE — Progress Notes (Signed)
Virtual Visit via Video Note  I connected with Tiffany Sanchez on 02/28/23 at  1:30 PM EDT by a video enabled telemedicine application and verified that I am speaking with the correct person using two identifiers.  Location Provider Location : [facility] Patient Location : Work  Participants: Patient , Provider   I discussed the limitations of evaluation and management by telemedicine and the availability of in person appointments. The patient expressed understanding and agreed to proceed.   I discussed the assessment and treatment plan with the patient. The patient was provided an opportunity to ask questions and all were answered. The patient agreed with the plan and demonstrated an understanding of the instructions.   The patient was advised to call back or seek an in-person evaluation if the symptoms worsen or if the condition fails to improve as anticipated.  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  She reports by Monday she will be able to start working from home.  She reports she does have anxiety since there is a lot going on right now with a new job with the training.  She however otherwise reports mood symptoms as improved on the lamotrigine.  Has been taking only lamotrigine 50 mg and did not increase to 75 mg as discussed.  Reports she would like to stay on the 50 mg for now.  Patient declines trial of BuSpar for her anxiety symptoms,  reports it was prescribed to her by primary care provider previously and she does have supplies at home.  She will let writer know if she is interested.  Patient reports sleep has improved.  She wonders whether she can take the hydroxyzine prescribed as needed at bedtime for anxiety at night.  Patient denies any suicidality, homicidality or perceptual disturbances.  Reports although she was supposed to have a psychotherapy visit with her therapist that got rescheduled.  Patient reports she has not been able to schedule an appointment yet since she is at this new job.  Patient denies any other concerns today.  Visit Diagnosis:    ICD-10-CM   1. Bipolar II disorder, mild, depressed, with anxious distress  F31.81     2. Insomnia, unspecified type  G47.00       Past Psychiatric History: I have reviewed past psychiatric history from progress note on 09/19/2022.  Past trials of Wellbutrin-anger, BuSpar-noncompliant.  Past Medical History:  Past Medical History:  Diagnosis Date   Anemia    Asthma    no hospitalizations, no recent attacks (since childhood)   Cervical dysplasia    Complication of anesthesia    one sided epidural   Genital herpes 06/03/2018   History of gestational hypertension    Hypertension, benign essential, goal below 140/90 01/25/2022   Low grade squamous intraepithelial lesion (LGSIL) at risk for high grade squamous intraepithelial lesion (HGSIL) on cytologic smear of cervix 11/10/2020   Given concern about high grade lesion, colposcopy recommended. Of note, also had  LGSIL pap in 2018 ([facility] results), but had normal pap in 2019.    Ovarian cyst    Trichomonal vaginitis 04/20/2020   Late may 2021   UTI (urinary tract infection)     Past Surgical History:  Procedure Laterality Date   CESAREAN SECTION N/A 06/28/2018   Procedure: CESAREAN SECTION;  Surgeon: Levie Heritage, DO;  Location: [facility];  Service: Obstetrics;  Laterality: N/A;    THERAPEUTIC ABORTION      Family Psychiatric History: Reviewed family psychiatric history from progress note on 09/19/2022.  Family History:  Family History  Problem Relation Age of Onset   Alcohol abuse Mother    Depression Mother    Hypertension Mother    Hypertension Father    Drug abuse Brother    Bipolar disorder Brother    Diabetes Paternal Grandfather     Social History: Reviewed social history from progress note on 09/19/2022. Social History   Socioeconomic History   Marital status: Single    Spouse name: Not on file   Number of children: 2   Years of education: Not on file   Highest education level: Bachelor's degree (e.g., BA, AB, BS)  Occupational History   Not on file  Tobacco Use   Smoking status: Never   Smokeless tobacco: Never  Vaping Use   Vaping Use: Never used  Substance and Sexual Activity   Alcohol use: Yes    Comment: every 2-3 months   Drug use: No   Sexual activity: Yes    Birth control/protection: Condom  Other Topics Concern   Not on file  Social History Narrative   Not on file   Social Determinants of Health   Financial Resource Strain: Low Risk  (01/08/2023)   Overall Financial Resource Strain (CARDIA)    Difficulty of Paying Living Expenses: Not hard at all  Food Insecurity: No Food Insecurity (01/08/2023)   Hunger Vital Sign    Worried About Running Out of Food in the Last Year: Never true    Ran Out of Food in the Last Year: Never true  Transportation Needs: No Transportation Needs (01/08/2023)   PRAPARE - Administrator, Civil Service (Medical): No    Lack of Transportation (Non-Medical): No  Physical Activity: Not on file  Stress: Not on file  Social Connections: Not on file    Allergies:  Allergies  Allergen Reactions   Penicillins Other (See Comments)    Unknown reaction-childhood allergy  Has patient had a PCN reaction causing immediate rash, facial/tongue/throat swelling, SOB or lightheadedness with  hypotension: Unknown Has patient had a PCN reaction causing severe rash involving mucus membranes or skin necrosis: Unknown Has patient had a PCN reaction that required hospitalization: Unknown Has patient had a PCN reaction occurring within the last 10 years: Unknown If all of the above answers are "NO", then may proceed with Cephalosporin use.     Metabolic Disorder Labs: Lab Results  Component Value Date   HGBA1C 5.2 06/19/2021   No results found for: "PROLACTIN" No results found for: "CHOL", "TRIG", "HDL", "CHOLHDL", "VLDL", "LDLCALC" Lab Results  Component Value Date   TSH 0.734 06/19/2021    Therapeutic Level Labs: No results found for: "LITHIUM" No results found for: "VALPROATE" No results found for: "CBMZ"  Current Medications: Current Outpatient Medications  Medication Sig Dispense Refill   calcium carbonate (TUMS EX) 750 MG chewable tablet Chew by mouth.     calcium citrate (CALCITRATE - DOSED IN MG ELEMENTAL CALCIUM)  950 (200 Ca) MG tablet Take 200 mg of elemental calcium by mouth daily.     cetirizine (ZYRTEC) 10 MG tablet Take by mouth.     escitalopram (LEXAPRO) 20 MG tablet TAKE 1 TABLET BY MOUTH ONCE A DAY 90 tablet 0   ferrous sulfate 325 (65 FE) MG tablet Take 1 tablet by mouth daily with breakfast.     fluticasone (FLONASE) 50 MCG/ACT nasal spray Place into the nose.     hydrOXYzine (ATARAX) 10 MG tablet Take 1 tablet (10 mg total) by mouth 3 (three) times daily as needed for anxiety. 90 tablet 1   lamoTRIgine (LAMICTAL) 25 MG tablet Take 1 tablet (25 mg total) by mouth 2 (two) times daily for 14 days. 28 tablet 0   Multiple Vitamins-Iron (MULTIVITAMINS WITH IRON) TABS tablet Take 1 tablet by mouth daily.     norethindrone (AYGESTIN) 5 MG tablet Take 1 tablet (5 mg total) by mouth daily. 30 tablet 1   omeprazole (PRILOSEC) 20 MG capsule TAKE 1 CAPSULE EVERY DAY FOR ACID REFLUX AS NEEDED     ondansetron (ZOFRAN-ODT) 8 MG disintegrating tablet Take by mouth.      phentermine 37.5 MG capsule Take by mouth.     promethazine (PHENERGAN) 25 MG tablet Take 25 mg by mouth every 6 (six) hours as needed.     SUMAtriptan (IMITREX) 50 MG tablet      topiramate (TOPAMAX) 25 MG tablet Take 25 mg by mouth daily.     valACYclovir (VALTREX) 500 MG tablet Take 1 tablet (500 mg total) by mouth daily. Take twice a day for 5 days in the event of a recurrence 30 tablet 1   Albuterol Sulfate, sensor, 108 (90 Base) MCG/ACT AEPB Inhale into the lungs. (Patient not taking: Reported on 02/28/2023)     No current facility-administered medications for this visit.     Musculoskeletal: Strength & Muscle Tone:  UTA Gait & Station:  Seated Patient leans: N/A  Psychiatric Specialty Exam: Review of Systems  Psychiatric/Behavioral:  The patient is nervous/anxious.   All other systems reviewed and are negative.   There were no vitals taken for this visit.There is no height or weight on file to calculate BMI.  General Appearance: Casual  Eye Contact:  Fair  Speech:  Clear and Coherent  Volume:  Normal  Mood:  Anxious  Affect:  Appropriate  Thought Process:  Goal Directed and Descriptions of Associations: Intact  Orientation:  Full (Time, Place, and Person)  Thought Content: Logical   Suicidal Thoughts:  No  Homicidal Thoughts:  No  Memory:  Immediate;   Fair Recent;   Fair Remote;   Fair  Judgement:  Fair  Insight:  Fair  Psychomotor Activity:  Normal  Concentration:  Concentration: Fair and Attention Span: Fair  Recall:  [employer]Fair  Fund of Knowledge: Fair  Language: Fair  Akathisia:  No  Handed:  Right  AIMS (if indicated): not done  Assets:  Communication Skills Desire for Improvement Housing Social Support Talents/Skills Transportation  ADL's:  Intact  Cognition: WNL  Sleep:  Fair   Screenings: GAD-7    Garment/textile technologistlowsheet Row [facility]  Total GAD-7 Score 13      PHQ2-9    Flowsheet  Row [facility]  PHQ-2 Total Score 4 0 2  PHQ-9 Total Score -- -- 9      Flowsheet Row Video Visit from 02/28/2023 in [facility] Video Visit from 01/31/2023 in [facility] ED from 12/04/2022 in [facility] Department at [facility]  C-SSRS RISK CATEGORY No Risk No Risk No Risk        Assessment and Plan: Tiffany Sanchez is a 27 year old African-American female who is single, currently employed, has a history of bipolar disorder, insomnia, migraine headaches, history of gastric sleeve surgery was evaluated by telemedicine today.  Patient is currently at a new job, reports mood symptoms as improving although she continues to have anxiety, noncompliant with medication recommendations as well as psychotherapy, will benefit from the following plan.  Plan Bipolar disorder type II depressed with anxious distress, mild-improving Continue Lamictal 25 mg p.o. twice daily Lexapro 20 mg p.o. daily Hydroxyzine 10 mg p.o. nightly as needed encouraged compliance Discussed with patient to hold phentermine if she is having a lot of anxiety.  Patient to discuss with her provider Discussed addition of BuSpar low dosage however patient is not interested at this time. Patient was referred for CBT-patient has been noncompliant.  Insomnia unspecified-improving Continue sleep hygiene techniques Hydroxyzine 10 mg p.o. nightly as needed  Patient on topiramate, for weight loss, discussed drug to drug interaction with Lamictal including reduced efficacy, increased adverse side effect of Topamax.  Follow-up in clinic in 2 months or sooner if needed.    Consent: Patient/Guardian gives  verbal consent for treatment and assignment of benefits for services provided during this visit. Patient/Guardian expressed understanding and agreed to proceed.   This note was generated in part or whole with voice recognition software. Voice recognition is usually quite accurate but there are transcription errors that can and very often do occur. I apologize for any typographical errors that were not detected and corrected.    Jomarie Longs, MD 03/01/2023, 9:34 AM

## 2023-04-30 ENCOUNTER — Encounter: Payer: Self-pay | Admitting: Psychiatry

## 2023-04-30 ENCOUNTER — Telehealth (INDEPENDENT_AMBULATORY_CARE_PROVIDER_SITE_OTHER): Payer: Medicaid Other | Admitting: Psychiatry

## 2023-04-30 DIAGNOSIS — F99 Mental disorder, not otherwise specified: Secondary | ICD-10-CM

## 2023-04-30 DIAGNOSIS — F5105 Insomnia due to other mental disorder: Secondary | ICD-10-CM | POA: Diagnosis not present

## 2023-04-30 DIAGNOSIS — F3181 Bipolar II disorder: Secondary | ICD-10-CM

## 2023-04-30 MED ORDER — ESCITALOPRAM OXALATE 20 MG PO TABS: ORAL_TABLET | ORAL | 0 refills | Status: DC

## 2023-04-30 MED ORDER — LAMOTRIGINE 25 MG PO TABS
25.0000 mg | ORAL_TABLET | Freq: Two times a day (BID) | ORAL | 0 refills | Status: DC
Start: 2023-04-30 — End: 2023-07-15

## 2023-04-30 NOTE — Progress Notes (Signed)
Virtual Visit via Video Note  I connected with Tiffany Sanchez on 04/30/23 at 11:30 AM EDT by a video enabled telemedicine application and verified that I am speaking with the correct person using two identifiers.  Location Provider Location : [facility] Patient Location : Home  Participants: Patient , Provider   I discussed the limitations of evaluation and management by telemedicine and the availability of in person appointments. The patient expressed understanding and agreed to proceed.    I discussed the assessment and treatment plan with the patient. The patient was provided an opportunity to ask questions and all were answered. The patient agreed with the plan and demonstrated an understanding of the instructions.   The patient was advised to call back or seek an in-person evaluation if the symptoms worsen or if the condition fails to improve as anticipated.   BH MD OP Progress Note  04/30/2023 11:58 AM Tiffany Sanchez  MRN:  [id number]  Chief Complaint:  Chief Complaint  Patient presents with   Follow-up   Anxiety   Depression   Medication Refill   HPI: Tiffany Sanchez is a 27 year old African-American female, currently employed with [employer], single, lives in [place], has a history of bipolar disorder type II, insomnia, migraine headaches, history of hypertension, gastric sleeve surgery (09/03/2022) was evaluated by telemedicine today.  Patient today reports she is currently doing well with regards to her mood.  Denies any mania, hypomanic symptoms or depression symptoms.  Anxiety symptoms are being well-managed on the current medication regimen.  Patient reports sleep is overall good.  Reports appetite is fair.  She denies any suicidality, homicidality or perceptual disturbances.  Patient reports she likes her new job and she is able to work from home.  That does help a lot.  Patient reports she has established With a therapist, [facility], [place], Ms. Monico Blitz.  Reports she sees her therapist every 2 weeks.  Therapy sessions are beneficial.  Patient reports although lamotrigine was advised to be increased to 75 mg daily she is only using 25 mg twice daily.  Would like to stay on this dosage.  Continues to be compliant on the Lexapro.  Denies side effects.  Patient denies any other concerns today.  Visit Diagnosis:    ICD-10-CM   1. Bipolar II disorder, mild, depressed, with anxious distress (HCC)  F31.81 escitalopram (LEXAPRO) 20 MG tablet    lamoTRIgine (LAMICTAL) 25 MG tablet    2. Insomnia due to other mental disorder  F51.05    F99    Mood symptoms      Past Psychiatric History: I have reviewed past psychiatric history from progress note on 09/19/2022.  Past trials of Wellbutrin-anger, BuSpar-noncompliant.  Past Medical History:  Past Medical History:  Diagnosis Date   Anemia    Asthma    no hospitalizations, no recent attacks (since childhood)   Cervical dysplasia    Complication of anesthesia    one sided epidural   Genital herpes 06/03/2018   History of gestational hypertension    Hypertension, benign essential, goal below 140/90 01/25/2022   Low grade squamous intraepithelial lesion (LGSIL) at risk for high grade squamous intraepithelial lesion (HGSIL) on cytologic smear of cervix 11/10/2020   Given concern about high grade lesion, colposcopy recommended. Of note, also had LGSIL pap in 2018 ([facility] results), but had normal pap in 2019.    Ovarian cyst    Trichomonal vaginitis 04/20/2020   Late may 2021   UTI (urinary tract  infection)     Past Surgical History:  Procedure Laterality Date   CESAREAN SECTION N/A 06/28/2018   Procedure: CESAREAN SECTION;  Surgeon: Levie Heritage, DO;  Location: [facility];  Service: Obstetrics;  Laterality: N/A;   THERAPEUTIC ABORTION      Family Psychiatric History: Reviewed family psychiatric history from progress note on 09/19/2022.  Family History:  Family History   Problem Relation Age of Onset   Alcohol abuse Mother    Depression Mother    Hypertension Mother    Hypertension Father    Drug abuse Brother    Bipolar disorder Brother    Diabetes Paternal Grandfather     Social History: Reviewed social history from progress note on 09/19/2022. Social History   Socioeconomic History   Marital status: Single    Spouse name: Not on file   Number of children: 2   Years of education: Not on file   Highest education level: Bachelor's degree (e.g., BA, AB, BS)  Occupational History   Not on file  Tobacco Use   Smoking status: Never   Smokeless tobacco: Never  Vaping Use   Vaping Use: Never used  Substance and Sexual Activity   Alcohol use: Yes    Comment: every 2-3 months   Drug use: No   Sexual activity: Yes    Birth control/protection: Condom  Other Topics Concern   Not on file  Social History Narrative   Not on file   Social Determinants of Health   Financial Resource Strain: Low Risk  (01/08/2023)   Overall Financial Resource Strain (CARDIA)    Difficulty of Paying Living Expenses: Not hard at all  Food Insecurity: No Food Insecurity (01/08/2023)   Hunger Vital Sign    Worried About Running Out of Food in the Last Year: Never true    Ran Out of Food in the Last Year: Never true  Transportation Needs: No Transportation Needs (01/08/2023)   PRAPARE - Administrator, Civil Service (Medical): No    Lack of Transportation (Non-Medical): No  Physical Activity: Not on file  Stress: Not on file  Social Connections: Not on file    Allergies:  Allergies  Allergen Reactions   Penicillins Other (See Comments)    Unknown reaction-childhood allergy  Has patient had a PCN reaction causing immediate rash, facial/tongue/throat swelling, SOB or lightheadedness with hypotension: Unknown Has patient had a PCN reaction causing severe rash involving mucus membranes or skin necrosis: Unknown Has patient had a PCN reaction that required  hospitalization: Unknown Has patient had a PCN reaction occurring within the last 10 years: Unknown If all of the above answers are "NO", then may proceed with Cephalosporin use.     Metabolic Disorder Labs: Lab Results  Component Value Date   HGBA1C 5.2 06/19/2021   No results found for: "PROLACTIN" No results found for: "CHOL", "TRIG", "HDL", "CHOLHDL", "VLDL", "LDLCALC" Lab Results  Component Value Date   TSH 0.734 06/19/2021    Therapeutic Level Labs: No results found for: "LITHIUM" No results found for: "VALPROATE" No results found for: "CBMZ"  Current Medications: Current Outpatient Medications  Medication Sig Dispense Refill   Albuterol Sulfate, sensor, 108 (90 Base) MCG/ACT AEPB Inhale into the lungs. (Patient not taking: Reported on 02/28/2023)     calcium carbonate (TUMS EX) 750 MG chewable tablet Chew by mouth.     calcium citrate (CALCITRATE - DOSED IN MG ELEMENTAL CALCIUM) 950 (200 Ca) MG tablet Take 200 mg of elemental calcium  by mouth daily.     cetirizine (ZYRTEC) 10 MG tablet Take by mouth.     escitalopram (LEXAPRO) 20 MG tablet TAKE 1 TABLET BY MOUTH ONCE A DAY 90 tablet 0   ferrous sulfate 325 (65 FE) MG tablet Take 1 tablet by mouth daily with breakfast.     fluticasone (FLONASE) 50 MCG/ACT nasal spray Place into the nose.     hydrOXYzine (ATARAX) 10 MG tablet Take 1 tablet (10 mg total) by mouth 3 (three) times daily as needed for anxiety. 90 tablet 1   lamoTRIgine (LAMICTAL) 25 MG tablet Take 1 tablet (25 mg total) by mouth 2 (two) times daily. 180 tablet 0   Multiple Vitamins-Iron (MULTIVITAMINS WITH IRON) TABS tablet Take 1 tablet by mouth daily.     norethindrone (AYGESTIN) 5 MG tablet Take 1 tablet (5 mg total) by mouth daily. 30 tablet 1   omeprazole (PRILOSEC) 20 MG capsule TAKE 1 CAPSULE EVERY DAY FOR ACID REFLUX AS NEEDED     ondansetron (ZOFRAN-ODT) 8 MG disintegrating tablet Take by mouth.     promethazine (PHENERGAN) 25 MG tablet Take 25 mg by  mouth every 6 (six) hours as needed.     SUMAtriptan (IMITREX) 50 MG tablet      valACYclovir (VALTREX) 500 MG tablet Take 1 tablet (500 mg total) by mouth daily. Take twice a day for 5 days in the event of a recurrence 30 tablet 1   No current facility-administered medications for this visit.     Musculoskeletal: Strength & Muscle Tone:  UTA Gait & Station:  Seated Patient leans: N/A  Psychiatric Specialty Exam: Review of Systems  Psychiatric/Behavioral: Negative.      There were no vitals taken for this visit.There is no height or weight on file to calculate BMI.  General Appearance: Fairly Groomed  Eye Contact:  Fair  Speech:  Clear and Coherent  Volume:  Normal  Mood:  Euthymic  Affect:  Congruent  Thought Process:  Goal Directed and Descriptions of Associations: Intact  Orientation:  Full (Time, Place, and Person)  Thought Content: Logical   Suicidal Thoughts:  No  Homicidal Thoughts:  No  Memory:  Immediate;   Fair Recent;   Fair Remote;   Fair  Judgement:  Fair  Insight:  Fair  Psychomotor Activity:  Normal  Concentration:  Concentration: Fair and Attention Span: Fair  Recall:  [employer] of Knowledge: Fair  Language: Fair  Akathisia:  No  Handed:  Right  AIMS (if indicated): not done  Assets:  Communication Skills Desire for Improvement Housing Social Support Talents/Skills Transportation  ADL's:  Intact  Cognition: WNL  Sleep:  Fair   Screenings: GAD-7    Garment/textile technologist Visit from 09/19/2022 in [facility]  Total GAD-7 Score 13      PHQ2-9    Flowsheet Row Office Visit [facility]/08/20[facility] in [facility] Video Visit from 11/01/2022 in [facility] Office Visit from 09/19/2022 in [facility]  PHQ-2 Total Score 4 0 2  PHQ-9 Total Score -- -- 9      Flowsheet Row Video Visit from 04/30/2023  in [facility] Video Visit from 02/28/2023 in [facility] Video Visit from 01/31/2023 in [facility]  C-SSRS RISK CATEGORY No Risk No Risk No Risk        Assessment  and Plan: Tiffany Sanchez is a 27 year old African-American female who is single, currently employed, has a history of bipolar disorder, insomnia, migraine headaches, history of gastric sleeve surgery was evaluated by telemedicine today.  Patient is currently stable.  Plan as noted below.  Plan Bipolar disorder type II depressed with anxious distress mild in remission Lamotrigine 25 mg p.o. twice daily Lexapro 20 mg p.o. daily Hydroxyzine 10 mg p.o. nightly as needed. Patient to continue CBT with Ms. Monico Blitz with [facility] in [place].   Insomnia-improving Continue sleep hygiene techniques Hydroxyzine 10 mg as needed also helps with sleep.  Follow-up in clinic in 3 to 4 months or sooner in person.   Collaboration of Care: Collaboration of Care: Referral or follow-up with counselor/therapist AEB patient encouraged to continue CBT.  Patient/Guardian was advised Release of Information must be obtained prior to any record release in order to collaborate their care with an outside provider. Patient/Guardian was advised if they have not already done so to contact the registration department to sign all necessary forms in order for [place] to release information regarding their care.   Consent: Patient/Guardian gives verbal consent for treatment and assignment of benefits for services provided during this visit. Patient/Guardian expressed understanding and agreed to proceed.   This note was generated in part or whole with voice recognition software. Voice recognition is usually quite accurate but there are transcription errors that can and very often do occur. I apologize for any typographical errors that  were not detected and corrected.    Jomarie Longs, MD 04/30/2023, 11:58 AM

## 2023-05-08 DIAGNOSIS — D500 Iron deficiency anemia secondary to blood loss (chronic): Secondary | ICD-10-CM | POA: Insufficient documentation

## 2023-05-08 DIAGNOSIS — Z9884 Bariatric surgery status: Secondary | ICD-10-CM | POA: Insufficient documentation

## 2023-06-21 ENCOUNTER — Ambulatory Visit
Admission: EM | Admit: 2023-06-21 | Discharge: 2023-06-21 | Disposition: A | Discharge status: Home / Self Care | Payer: Medicaid Other

## 2023-06-21 NOTE — ED Provider Notes (Signed)
Patient left due to another appt and schedule an appointment tomorrow   Bing Neighbors, NP 06/21/23 1706

## 2023-06-22 ENCOUNTER — Ambulatory Visit
Admission: EM | Admit: 2023-06-22 | Discharge: 2023-06-22 | Disposition: A | Discharge status: Home / Self Care | Payer: Medicaid Other | Attending: Urgent Care | Admitting: Urgent Care

## 2023-06-22 DIAGNOSIS — L700 Acne vulgaris: Secondary | ICD-10-CM

## 2023-06-22 DIAGNOSIS — B009 Herpesviral infection, unspecified: Secondary | ICD-10-CM | POA: Diagnosis not present

## 2023-06-22 MED ORDER — BENZOYL PEROXIDE-ERYTHROMYCIN 5-3 % EX GEL: Freq: Two times a day (BID) | CUTANEOUS | 0 refills | Status: AC

## 2023-06-22 MED ORDER — VALACYCLOVIR HCL 1 G PO TABS: 1000.0000 mg | ORAL_TABLET | Freq: Two times a day (BID) | ORAL | 3 refills | Status: AC

## 2023-06-22 NOTE — ED Triage Notes (Signed)
Patient presents to UC for medication refill for HSV flare-up. States she has a lesion x 2 days ago. She takes Valtrex and is out.   Denies concern for STD or pregnancy.

## 2023-06-22 NOTE — Discharge Instructions (Signed)
Take 1 tablet of Valtrex twice daily for the next 7 to 10 days.  If the lesion is completely gone on day 7, you may stop.  If it is still there, complete all 10 days.  I have given you 3 refills, and may use on an as-needed basis.  Please use the Benzamycin gel twice daily to your face.  If it becomes abnormally drying, cut back to once daily.  Allow to dry completely, the gel can bleach out clothing and fabrics.

## 2023-06-23 NOTE — ED Provider Notes (Signed)
Tiffany Sanchez    CSN: [id number] Arrival date & time: 06/22/23  1100      History   Chief Complaint Chief Complaint  Patient presents with   Medication Refill    HPI Tiffany Sanchez is a 27 y.o. female.   Pleasant 27yo female presents today primarily to request refill of her valtrex. 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 Gets 2-3 flares a year max. Has been prescribed the 500mg  tab in the past and states she will take one daily for 7 days while on her menstrual cycle, and then take two tabs twice daily during a flare. She states she notes one small bump in her vaginal region, at the same location as all previous. Sx present x 2 days.  Pt also is concerned about bumps on her face. Feels like she is breaking out. Notes two bumps on her upper lip near her philtrum, and several smaller bumps to forehead.    Medication Refill   Past Medical History:  Diagnosis Date   Anemia    Asthma    no hospitalizations, no recent attacks (since childhood)   Cervical dysplasia    Complication of anesthesia    one sided epidural   Genital herpes 06/03/2018   History of gestational hypertension    Hypertension, benign essential, goal below 140/90 01/25/2022   Low grade squamous intraepithelial lesion (LGSIL) at risk for high grade squamous intraepithelial lesion (HGSIL) on cytologic smear of cervix 11/10/2020   Given concern about high grade lesion, colposcopy recommended. Of note, also had LGSIL pap in 2018 ([facility] results), but had normal pap in 2019.    Ovarian cyst    Trichomonal vaginitis 04/20/2020   Late may 2021   UTI (urinary tract infection)     Patient Active Problem List   Diagnosis Date Noted   Asthma 09/19/2022   Bipolar II disorder, mild, depressed, with anxious distress (HCC) 09/19/2022   Insomnia 09/19/2022   Risk factors for obstructive sleep apnea 08/20/2022   Abnormal uterine bleeding 06/06/2022   Hypertension, benign essential, goal  below 140/90 01/25/2022   GERD (gastroesophageal reflux disease) 01/25/2022   Menorrhagia with regular cycle 01/10/2022   Dysplasia of cervix, low grade (CIN 1) 12/22/2020   Low grade squamous intraepithelial lesion (LGSIL) at risk for high grade lesion on pap smear 11/10/20 11/10/2020   Family history of breast cancer 10/22/2019   BMI 50.0-59.9, adult (HCC) 06/03/2018   Genital herpes 06/03/2018   History of gestational hypertension 12/20/2017   History of shoulder dystocia in prior pregnancy, currently pregnant 12/20/2017    Past Surgical History:  Procedure Laterality Date   CESAREAN SECTION N/A 06/28/2018   Procedure: CESAREAN SECTION;  Surgeon: Levie Heritage, DO;  Location: [facility];  Service: Obstetrics;  Laterality: N/A;   THERAPEUTIC ABORTION      OB History     Gravida  4   Para  2   Term  2   Preterm      AB  2   Living  2      SAB      IAB  1   Ectopic      Multiple  0   Live Births  2            Home Medications    Prior to Admission medications   Medication Sig Start Date End Date Taking? Authorizing Provider  benzoyl peroxide-erythromycin (BENZAMYCIN) gel Apply topically 2 (two) times  daily. Can bleach fabric; allow to dry completely. Cut back to once daily if too drying on skin 06/22/23  Yes Daquisha Clermont L, PA  valACYclovir (VALTREX) 1000 MG tablet Take 1 tablet (1,000 mg total) by mouth 2 (two) times daily for 10 days. May discontinue treatment on day 7 if lesion is completely healed 06/22/23 07/02/23 Yes Lavoris Sparling L, PA  Albuterol Sulfate, sensor, 108 (90 Base) MCG/ACT AEPB Inhale into the lungs. Patient not taking: Reported on 02/28/2023    [provider]  calcium carbonate (TUMS EX) 750 MG chewable tablet Chew by mouth.    [provider]  calcium citrate (CALCITRATE - DOSED IN MG ELEMENTAL CALCIUM) 950 (200 Ca) MG tablet Take 200 mg of elemental calcium by mouth daily.    [provider]   cetirizine (ZYRTEC) 10 MG tablet Take by mouth.    [provider]  escitalopram (LEXAPRO) 20 MG tablet TAKE 1 TABLET BY MOUTH ONCE A DAY 04/30/23   Jomarie Longs, MD  ferrous sulfate 325 (65 FE) MG tablet Take 1 tablet by mouth daily with breakfast. 06/12/22   [provider]  fluticasone (FLONASE) 50 MCG/ACT nasal spray Place into the nose.    [provider]  hydrOXYzine (ATARAX) 10 MG tablet Take 1 tablet (10 mg total) by mouth 3 (three) times daily as needed for anxiety. 09/19/22   Jomarie Longs, MD  lamoTRIgine (LAMICTAL) 25 MG tablet Take 1 tablet (25 mg total) by mouth 2 (two) times daily. 04/30/23   Jomarie Longs, MD  Multiple Vitamins-Iron (MULTIVITAMINS WITH IRON) TABS tablet Take 1 tablet by mouth daily.    [provider]  norethindrone (AYGESTIN) 5 MG tablet Take 1 tablet (5 mg total) by mouth daily. 11/09/22   Reva Bores, MD  omeprazole (PRILOSEC) 20 MG capsule TAKE 1 CAPSULE EVERY DAY FOR ACID REFLUX AS NEEDED 11/06/21   [provider]  ondansetron (ZOFRAN-ODT) 8 MG disintegrating tablet Take by mouth. 09/18/22   [provider]  promethazine (PHENERGAN) 25 MG tablet Take 25 mg by mouth every 6 (six) hours as needed. 08/01/22   [provider]  SUMAtriptan (IMITREX) 50 MG tablet  11/06/21   [provider]    Family History Family History  Problem Relation Age of Onset   Alcohol abuse Mother    Depression Mother    Hypertension Mother    Hypertension Father    Drug abuse Brother    Bipolar disorder Brother    Diabetes Paternal Grandfather     Social History Social History   Tobacco Use   Smoking status: Never   Smokeless tobacco: Never  Vaping Use   Vaping status: Never Used  Substance Use Topics   Alcohol use: Yes    Comment: every 2-3 months   Drug use: No     Allergies   Penicillins   Review of Systems Review of Systems As per HPI  Physical Exam Triage Vital Signs ED  Triage Vitals [06/22/23 1108]  Encounter Vitals Group     BP (!) 137/90     Systolic BP Percentile      Diastolic BP Percentile      Pulse Rate 92     Resp 16     Temp 97.7 F (36.5 C)     Temp Source Temporal     SpO2 96 %     Weight      Height      Head Circumference  Peak Flow      Pain Score 0     Pain Loc      Pain Education      Exclude from Growth Chart    No data found.  Updated Vital Signs BP (!) 137/90 (BP Location: Left Arm)   Pulse 92   Temp 97.7 F (36.5 C) (Temporal)   Resp 16   LMP 06/08/2023 (Approximate)   SpO2 96%   Visual Acuity Right Eye Distance:   Left Eye Distance:   Bilateral Distance:    Right Eye Near:   Left Eye Near:    Bilateral Near:     Physical Exam Vitals and nursing note reviewed. Exam conducted with a chaperone present.  Constitutional:      General: She is not in acute distress.    Appearance: Normal appearance. She is not ill-appearing, toxic-appearing or diaphoretic.  HENT:     Head: Normocephalic and atraumatic.     Right Ear: External ear normal.     Left Ear: External ear normal.  Cardiovascular:     Rate and Rhythm: Normal rate.  Pulmonary:     Effort: Pulmonary effort is normal. No respiratory distress.  Genitourinary:    Comments: deferred Musculoskeletal:     Cervical back: Normal range of motion.  Lymphadenopathy:     Cervical: No cervical adenopathy.  Skin:    General: Skin is warm and dry.     Coloration: Skin is not jaundiced.     Findings: No bruising or erythema.     Comments: Numerous comedones across T zone of face, two noted to upper lip  Neurological:     General: No focal deficit present.     Mental Status: She is alert and oriented to person, place, and time.      UC Treatments / Results  Labs (all labs ordered are listed, but only abnormal results are displayed) Labs Reviewed - No data to display  EKG   Radiology No results found.  Procedures Procedures (including critical  care time)  Medications Ordered in UC Medications - No data to display  Initial Impression / Assessment and Plan / UC Course  I have reviewed the triage vital signs and the nursing notes.  Pertinent labs & imaging results that were available during my care of the patient were reviewed by me and considered in my medical decision making (see chart for details).     HSV -patient with history of recurrent HSV.  Will recommend switching to 1 g tablet twice daily for 7 to 10 days.  Take this on an intermittent an as-needed basis, there is no indication to take for 1 week around her menstrual period. Comedonal acne -will do topical benzyl peroxide/erythromycin gel.  Discussed side effects and adverse reactions of the medication.  Follow-up with PCP or dermatology if symptoms persist   Final Clinical Impressions(s) / UC Diagnoses   Final diagnoses:  Herpes simplex  Comedone     Discharge Instructions      Take 1 tablet of Valtrex twice daily for the next 7 to 10 days.  If the lesion is completely gone on day 7, you may stop.  If it is still there, complete all 10 days.  I have given you 3 refills, and may use on an as-needed basis.  Please use the Benzamycin gel twice daily to your face.  If it becomes abnormally drying, cut back to once daily.  Allow to dry completely, the gel can bleach out clothing and  fabrics.   ED Prescriptions     Medication Sig Dispense Auth. Provider   benzoyl peroxide-erythromycin (BENZAMYCIN) gel Apply topically 2 (two) times daily. Can bleach fabric; allow to dry completely. Cut back to once daily if too drying on skin 23.3 g Briannon Boggio L, PA   valACYclovir (VALTREX) 1000 MG tablet Take 1 tablet (1,000 mg total) by mouth 2 (two) times daily for 10 days. May discontinue treatment on day 7 if lesion is completely healed 20 tablet Shatara Stanek L, PA      PDMP not reviewed this encounter.   Maretta Bees, [place] 06/23/23 1658

## 2023-07-15 ENCOUNTER — Other Ambulatory Visit: Payer: Self-pay | Admitting: Psychiatry

## 2023-07-15 DIAGNOSIS — F3181 Bipolar II disorder: Secondary | ICD-10-CM

## 2023-07-15 MED ORDER — LAMOTRIGINE 25 MG PO TABS
25.0000 mg | ORAL_TABLET | Freq: Two times a day (BID) | ORAL | 0 refills | Status: DC
Start: 2023-07-15 — End: 2023-10-31

## 2023-07-15 MED ORDER — ESCITALOPRAM OXALATE 20 MG PO TABS
ORAL_TABLET | ORAL | 0 refills | Status: DC
Start: 2023-07-15 — End: 2023-11-08

## 2023-07-15 NOTE — Telephone Encounter (Signed)
I have sent refills for Lexapro and Lamictal to pharmacy.

## 2023-07-26 DIAGNOSIS — N871 Moderate cervical dysplasia: Secondary | ICD-10-CM

## 2023-07-26 HISTORY — DX: Moderate cervical dysplasia: N87.1

## 2023-08-13 ENCOUNTER — Telehealth (INDEPENDENT_AMBULATORY_CARE_PROVIDER_SITE_OTHER): Payer: Medicaid Other | Admitting: Psychiatry

## 2023-08-13 ENCOUNTER — Encounter: Payer: Self-pay | Admitting: Psychiatry

## 2023-08-13 DIAGNOSIS — G4701 Insomnia due to medical condition: Secondary | ICD-10-CM | POA: Diagnosis not present

## 2023-08-13 DIAGNOSIS — F3181 Bipolar II disorder: Secondary | ICD-10-CM

## 2023-08-13 MED ORDER — HYDROXYZINE HCL 10 MG PO TABS
10.0000 mg | ORAL_TABLET | Freq: Three times a day (TID) | ORAL | 1 refills | Status: AC | PRN
Start: 2023-08-13 — End: ?

## 2023-08-13 NOTE — Progress Notes (Unsigned)
Virtual Visit via Video Note  I connected with Tiffany Sanchez on 08/13/23 at  1:00 PM EDT by a video enabled telemedicine application and verified that I am speaking with the correct person using two identifiers.  Location Provider Location : [facility] Patient Location : Work  Participants: Patient , Provider   I discussed the limitations of evaluation and management by telemedicine and the availability of in person appointments. The patient expressed understanding and agreed to proceed.   I discussed the assessment and treatment plan with the patient. The patient was provided an opportunity to ask questions and all were answered. The patient agreed with the plan and demonstrated an understanding of the instructions.   The patient was advised to call back or seek an in-person evaluation if the symptoms worsen or if the condition fails to improve as anticipated.    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  She has not been doing a lot of remote work anymore.  She reports she likes the fact that she is able to go back into the office since it helps her to socialize and that has been helpful with her mental health.  Patient reports she ran out of her Lexapro 3 days ago.  Patient reports she did not know she had a refill which was already sent out to the pharmacy.  She hence has had increased agitation, outbursts in the past 3 days.  Other than that she denies any significant mood  swings, anxiety, irritability or depression symptoms.  Patient reports sleep is overall good.  She is compliant on her other medications like Lamictal.  Denies side effects.  Patient denies any suicidality, homicidality or perceptual disturbances.  Continues to follow-up with her therapist on a regular basis.  Denies any other concerns today.  Visit Diagnosis:    ICD-10-CM   1. Bipolar II disorder, mild, depressed, with anxious distress (HCC)  F31.81 hydrOXYzine (ATARAX) 10 MG tablet    2. Insomnia due to medical condition  G47.01 hydrOXYzine (ATARAX) 10 MG tablet   Mood disorder      Past Psychiatric History: I have reviewed past psychiatric history from progress note on 09/19/2022.  Past trials of Wellbutrin-anger, BuSpar-noncompliant.  Past Medical History:  Past Medical History:  Diagnosis Date   Anemia    Asthma    no hospitalizations, no recent attacks (since childhood)   Cervical dysplasia    Complication of anesthesia    one sided epidural   Genital herpes 06/03/2018   History of gestational hypertension    Hypertension, benign essential, goal below 140/90 01/25/2022   Low grade squamous intraepithelial lesion (LGSIL) at risk for high grade squamous intraepithelial lesion (HGSIL) on cytologic smear of cervix 11/10/2020   Given concern about high grade lesion, colposcopy recommended. Of note, also had LGSIL pap in 2018 ([facility] results), but had normal pap in 2019.    Ovarian cyst    Trichomonal vaginitis 04/20/2020   Late may 2021   UTI (urinary tract  infection)     Past Surgical History:  Procedure Laterality Date   CESAREAN SECTION N/A 06/28/2018   Procedure: CESAREAN SECTION;  Surgeon: Levie Heritage, DO;  Location: [facility];  Service: Obstetrics;  Laterality: N/A;   THERAPEUTIC ABORTION      Family Psychiatric History: I have reviewed family psychiatric history from progress note on 09/19/2022.  Family History:  Family History  Problem  Relation Age of Onset   Alcohol abuse Mother    Depression Mother    Hypertension Mother    Hypertension Father    Drug abuse Brother    Bipolar disorder Brother    Diabetes Paternal Grandfather     Social History: I have reviewed social history from progress note on 09/19/2022. Social History   Socioeconomic History   Marital status: Single    Spouse name: Not on file   Number of children: 2   Years of education: Not on file   Highest education level: Bachelor's degree (e.g., BA, AB, BS)  Occupational History   Not on file  Tobacco Use   Smoking status: Never   Smokeless tobacco: Never  Vaping Use   Vaping status: Never Used  Substance and Sexual Activity   Alcohol use: Yes    Comment: every 2-3 months   Drug use: No   Sexual activity: Yes    Birth control/protection: Condom  Other Topics Concern   Not on file  Social History Narrative   Not on file   Social Determinants of Health   Financial Resource Strain: Patient Declined (05/08/2023)   Received [facility]   Overall Financial Resource Strain (CARDIA)    Difficulty of Paying Living Expenses: Patient declined  Food Insecurity: Patient Declined (05/08/2023)   Received from [facility]   Hunger Vital Sign    Worried About Running Out of Food in the Last Year: Patient declined    Ran Out of Food in the Last Year: Patient declined  Transportation Needs: Patient Declined (05/08/2023)   Received from [facility]   PRAPARE - Transportation    In the past 12 months, has lack of transportation kept you from medical appointments or from getting medications?: Patient declined    Lack of Transportation (Non-Medical): Patient declined  Physical Activity: Not on file  Stress: Not on file  Social Connections: Not on file    Allergies:  Allergies  Allergen Reactions   Penicillins Other (See Comments)    Unknown reaction-childhood allergy  Has patient had a PCN  reaction causing immediate rash, facial/tongue/throat swelling, SOB or lightheadedness with hypotension: Unknown Has patient had a PCN reaction causing severe rash involving mucus membranes or skin necrosis: Unknown Has patient had a PCN reaction that required hospitalization: Unknown Has patient had a PCN reaction occurring within the last 10 years: Unknown If all of the above answers are "NO", then may proceed with Cephalosporin use.     Metabolic Disorder Labs: Lab Results  Component Value Date   HGBA1C 5.2 06/19/2021   No results found for: "PROLACTIN" No results found for: "CHOL", "TRIG", "HDL", "CHOLHDL", "VLDL", "LDLCALC" Lab Results  Component Value Date   TSH 0.734 06/19/2021    Therapeutic Level Labs: No results found for: "LITHIUM" No results found for: "VALPROATE" No results found for: "CBMZ"  Current Medications: Current Outpatient Medications  Medication Sig Dispense Refill   Albuterol Sulfate, sensor, 108 (90 Base) MCG/ACT AEPB Inhale into the lungs. (Patient not taking: Reported on 02/28/2023)  benzoyl peroxide-erythromycin (BENZAMYCIN) gel Apply topically 2 (two) times daily. Can bleach fabric; allow to dry completely. Cut back to once daily if too drying on skin 23.3 g 0   calcium carbonate (TUMS EX) 750 MG chewable tablet Chew by mouth.     calcium citrate (CALCITRATE - DOSED IN MG ELEMENTAL CALCIUM) 950 (200 Ca) MG tablet Take 200 mg of elemental calcium by mouth daily.     cetirizine (ZYRTEC) 10 MG tablet Take by mouth.     escitalopram (LEXAPRO) 20 MG tablet TAKE 1 TABLET BY MOUTH ONCE A DAY 90 tablet 0   ferrous sulfate 325 (65 FE) MG tablet Take 1 tablet by mouth daily with breakfast.     fluticasone (FLONASE) 50 MCG/ACT nasal spray Place into the nose.     hydrOXYzine (ATARAX) 10 MG tablet Take 1 tablet (10 mg total) by mouth 3 (three) times daily as needed for anxiety. 90 tablet 1   lamoTRIgine (LAMICTAL) 25 MG tablet Take 1 tablet (25 mg total) by  mouth 2 (two) times daily. 180 tablet 0   Multiple Vitamins-Iron (MULTIVITAMINS WITH IRON) TABS tablet Take 1 tablet by mouth daily.     norethindrone (AYGESTIN) 5 MG tablet Take 1 tablet (5 mg total) by mouth daily. 30 tablet 1   omeprazole (PRILOSEC) 20 MG capsule TAKE 1 CAPSULE EVERY DAY FOR ACID REFLUX AS NEEDED     ondansetron (ZOFRAN-ODT) 8 MG disintegrating tablet Take by mouth.     promethazine (PHENERGAN) 25 MG tablet Take 25 mg by mouth every 6 (six) hours as needed.     SUMAtriptan (IMITREX) 50 MG tablet      No current facility-administered medications for this visit.     Musculoskeletal: Strength & Muscle Tone:  UTA Gait & Station:  Seated Patient leans: N/A  Psychiatric Specialty Exam: Review of Systems  Psychiatric/Behavioral:         Anxious, irritable    There were no vitals taken for this visit.There is no height or weight on file to calculate BMI.  General Appearance: Fairly Groomed  Eye Contact:  Fair  Speech:  Clear and Coherent  Volume:  Normal  Mood:   Anxious/irritable due to running out of Lexapro  Affect:  Appropriate  Thought Process:  Goal Directed and Descriptions of Associations: Intact  Orientation:  Full (Time, Place, and Person)  Thought Content: Logical   Suicidal Thoughts:  No  Homicidal Thoughts:  No  Memory:  Immediate;   Fair Recent;   Fair Remote;   Fair  Judgement:  Fair  Insight:  Fair  Psychomotor Activity:  Normal  Concentration:  Concentration: Fair and Attention Span: Fair  Recall:  [employer] of Knowledge: Fair  Language: Fair  Akathisia:  No  Handed:  Right  AIMS (if indicated): not done  Assets:  Communication Skills Desire for Improvement Housing Social Support  ADL's:  Intact  Cognition: WNL  Sleep:  Fair   Screenings: GAD-7    Garment/textile technologist Visit from 09/19/2022 in [facility]  Total GAD-7 Score 13      PHQ2-9    Flowsheet Row Office Visit from 01/08/2023  in [facility] Video Visit from 11/01/2022 in [facility] Office Visit from 09/19/2022 in [facility]  PHQ-2 Total Score 4 0 2  PHQ-9 Total Score -- -- 9      Flowsheet Row Video Visit from 08/13/2023  in [facility] ED from [facility]/22/2023 in [facility]  Video Visit from 04/30/2023 in [facility]  C-SSRS RISK CATEGORY No Risk No Risk No Risk        Assessment and Plan: Tiffany Sanchez is a 27 year old African-American female who is single, currently unemployed, has a history of bipolar disorder, insomnia, migraine headaches, history of gastric sleeve surgery was evaluated by telemedicine today.  Patient currently noncompliant on Lexapro since she reports she ran out which has caused some worsening mood symptoms although otherwise reports mood symptoms are stable when she is compliant on medications, discussed plan as noted below.  Plan Bipolar disorder type II depressed with anxious distress mild in remission Lamotrigine 25 mg p.o. twice daily Lexapro 20 mg p.o. daily Hydroxyzine 10 mg p.o. nightly as needed Patient to continue CBT with Ms. Monico Blitz  with [facility] in [place].  Insomnia-stable Continue sleep hygiene techniques Hydroxyzine 10 mg as needed.  Follow-up in clinic in 3 months in person or sooner if needed.  Collaboration of Care: Collaboration of Care: Referral or follow-up with counselor/therapist AEB patient encouraged to continue CBT  Patient/Guardian was advised Release of Information must be obtained prior to any record release in order to collaborate their care with an outside provider. Patient/Guardian was advised if they have not already done so to contact the registration department to sign all necessary forms in order for [place] to release  information regarding their care.   Consent: Patient/Guardian gives verbal consent for treatment and assignment of benefits for services provided during this visit. Patient/Guardian expressed understanding and agreed to proceed.   This note was generated in part or whole with voice recognition software. Voice recognition is usually quite accurate but there are transcription errors that can and very often do occur. I apologize for any typographical errors that were not detected and corrected.    Jomarie Longs, MD 08/14/2023, 9:47 AM

## 2023-09-16 ENCOUNTER — Encounter (HOSPITAL_COMMUNITY): Payer: Self-pay | Admitting: Obstetrics and Gynecology

## 2023-09-16 ENCOUNTER — Inpatient Hospital Stay (HOSPITAL_COMMUNITY)
Admission: AD | Admit: 2023-09-16 | Discharge: 2023-09-16 | Disposition: A | Discharge status: Home / Self Care | Payer: Medicaid Other | Attending: Obstetrics and Gynecology | Admitting: Obstetrics and Gynecology

## 2023-09-16 ENCOUNTER — Other Ambulatory Visit: Payer: Self-pay

## 2023-09-16 DIAGNOSIS — O3680X0 Pregnancy with inconclusive fetal viability, not applicable or unspecified: Secondary | ICD-10-CM | POA: Insufficient documentation

## 2023-09-16 DIAGNOSIS — Z3A01 Less than 8 weeks gestation of pregnancy: Secondary | ICD-10-CM | POA: Diagnosis not present

## 2023-09-16 DIAGNOSIS — O34219 Maternal care for unspecified type scar from previous cesarean delivery: Secondary | ICD-10-CM | POA: Insufficient documentation

## 2023-09-16 DIAGNOSIS — R109 Unspecified abdominal pain: Secondary | ICD-10-CM | POA: Diagnosis present

## 2023-09-16 LAB — URINALYSIS, ROUTINE W REFLEX MICROSCOPIC
Bilirubin Urine: NEGATIVE
Glucose, UA: NEGATIVE mg/dL
Hgb urine dipstick: NEGATIVE
Ketones, ur: NEGATIVE mg/dL
Leukocytes,Ua: NEGATIVE
Nitrite: NEGATIVE
Protein, ur: NEGATIVE mg/dL
Specific Gravity, Urine: 1.02 (ref 1.005–1.030)
pH: 6 (ref 5.0–8.0)

## 2023-09-16 LAB — WET PREP, GENITAL
Clue Cells Wet Prep HPF POC: NONE SEEN
Sperm: NONE SEEN
Trich, Wet Prep: NONE SEEN
WBC, Wet Prep HPF POC: <10 (ref <10)
Yeast Wet Prep HPF POC: NONE SEEN

## 2023-09-16 LAB — HCG, QUANTITATIVE, PREGNANCY: hCG, Beta Chain, Quant, S: 87 m[IU]/mL — ABNORMAL HIGH (ref <5)

## 2023-09-16 LAB — POCT PREGNANCY, URINE: Preg Test, Ur: POSITIVE — AB

## 2023-09-16 NOTE — MAU Provider Note (Signed)
History     [id number]  Arrival date and time: 09/16/23 1256    Chief Complaint  Patient presents with   Abdominal Pain   Vaginal Bleeding     HPI Tiffany Sanchez is a 27 y.o. at Unknown with PMHx notable for two prior cesareans, bariatric surgery, Bipolar II, who presents for positive home pregnancy test along with vaginal bleeding and cramping.   Patient reports feeling symptoms of pregnancy Also had positive home pregnancy tests Endorses some mild vaginal spotting and pelvic cramping Has irregular periods Not planned but still happy that she is pregnant     OB History     Gravida  5   Para  2   Term  2   Preterm      AB  2   Living  2      SAB      IAB  1   Ectopic      Multiple  0   Live Births  2           Past Medical History:  Diagnosis Date   Anemia    Asthma    no hospitalizations, no recent attacks (since childhood)   Cervical dysplasia    Complication of anesthesia    one sided epidural   Genital herpes 06/03/2018   History of gestational hypertension    Hypertension, benign essential, goal below 140/90 01/25/2022   Low grade squamous intraepithelial lesion (LGSIL) at risk for high grade squamous intraepithelial lesion (HGSIL) on cytologic smear of cervix 11/10/2020   Given concern about high grade lesion, colposcopy recommended. Of note, also had LGSIL pap in 2018 ([facility] results), but had normal pap in 2019.    Ovarian cyst    Trichomonal vaginitis 04/20/2020   Late may 2021   UTI (urinary tract infection)     Past Surgical History:  Procedure Laterality Date   CESAREAN SECTION N/A 06/28/2018   Procedure: CESAREAN SECTION;  Surgeon: Levie Heritage, DO;  Location: [facility];  Service: Obstetrics;  Laterality: N/A;   THERAPEUTIC ABORTION      Family History  Problem Relation Age of Onset   Alcohol abuse Mother    Depression Mother    Hypertension Mother    Hypertension Father    Drug abuse Brother     Bipolar disorder Brother    Diabetes Paternal Grandfather     Social History   Socioeconomic History   Marital status: Single    Spouse name: Not on file   Number of children: 2   Years of education: Not on file   Highest education level: Bachelor's degree (e.g., BA, AB, BS)  Occupational History   Not on file  Tobacco Use   Smoking status: Never   Smokeless tobacco: Never  Vaping Use   Vaping status: Never Used  Substance and Sexual Activity   Alcohol use: Yes    Comment: every 2-3 months   Drug use: No   Sexual activity: Yes    Birth control/protection: Condom  Other Topics Concern   Not on file  Social History Narrative   Not on file   Social Determinants of Health   Financial Resource Strain: Patient Declined (05/08/2023)   Received from [facility]   Overall Financial Resource Strain (CARDIA)    Difficulty of Paying Living Expenses: Patient declined  Food Insecurity: Patient Declined (05/08/2023)   Received from [facility]   Hunger Vital Sign    Worried About  Running Out of Food in the Last Year: Patient declined    Ran Out of Food in the Last Year: Patient declined  Transportation Needs: Patient Declined (05/08/2023)   Received from [facility] - [facility]    In the past 12 months, has lack of transportation kept you from medical appointments or from getting medications?: Patient declined    Lack of Transportation (Non-Medical): Patient declined  Physical Activity: Not on file  Stress: Not on file  Social Connections: Not on file  Intimate Partner Violence: Not At Risk (01/08/2023)   Humiliation, Afraid, Rape, and Kick questionnaire    Fear of Current or Ex-Partner: No    Emotionally Abused: No    Physically Abused: No    Sexually Abused: No    Allergies  Allergen Reactions   Penicillins Other (See Comments)    Unknown reaction-childhood allergy  Has patient had a PCN reaction causing  immediate rash, facial/tongue/throat swelling, SOB or lightheadedness with hypotension: Unknown Has patient had a PCN reaction causing severe rash involving mucus membranes or skin necrosis: Unknown Has patient had a PCN reaction that required hospitalization: Unknown Has patient had a PCN reaction occurring within the last 10 years: Unknown If all of the above answers are "NO", then may proceed with Cephalosporin use.     No current facility-administered medications on file prior to encounter.   Current Outpatient Medications on File Prior to Encounter  Medication Sig Dispense Refill   Albuterol Sulfate, sensor, 108 (90 Base) MCG/ACT AEPB Inhale into the lungs. (Patient not taking: Reported on 02/28/2023)     benzoyl peroxide-erythromycin (BENZAMYCIN) gel Apply topically 2 (two) times daily. Can bleach fabric; allow to dry completely. Cut back to once daily if too drying on skin 23.3 g 0   calcium carbonate (TUMS EX) 750 MG chewable tablet Chew by mouth.     calcium citrate (CALCITRATE - DOSED IN MG ELEMENTAL CALCIUM) 950 (200 Ca) MG tablet Take 200 mg of elemental calcium by mouth daily.     cetirizine (ZYRTEC) 10 MG tablet Take by mouth.     escitalopram (LEXAPRO) 20 MG tablet TAKE 1 TABLET BY MOUTH ONCE A DAY 90 tablet 0   ferrous sulfate 325 (65 FE) MG tablet Take 1 tablet by mouth daily with breakfast.     fluticasone (FLONASE) 50 MCG/ACT nasal spray Place into the nose.     hydrOXYzine (ATARAX) 10 MG tablet Take 1 tablet (10 mg total) by mouth 3 (three) times daily as needed for anxiety. 90 tablet 1   lamoTRIgine (LAMICTAL) 25 MG tablet Take 1 tablet (25 mg total) by mouth 2 (two) times daily. 180 tablet 0   Multiple Vitamins-Iron (MULTIVITAMINS WITH IRON) TABS tablet Take 1 tablet by mouth daily.     norethindrone (AYGESTIN) 5 MG tablet Take 1 tablet (5 mg total) by mouth daily. 30 tablet 1   omeprazole (PRILOSEC) 20 MG capsule TAKE 1 CAPSULE EVERY DAY FOR ACID REFLUX AS NEEDED      ondansetron (ZOFRAN-ODT) 8 MG disintegrating tablet Take by mouth.     promethazine (PHENERGAN) 25 MG tablet Take 25 mg by mouth every 6 (six) hours as needed.     SUMAtriptan (IMITREX) 50 MG tablet        ROS Pertinent positives and negative per HPI, all others reviewed and negative  Physical Exam   BP 135/83 (BP Location: Right Arm)   Pulse 98   Temp 98.3 F (36.8 C) (Oral)  Resp 19   Ht 5\' 7"  (1.702 m)   Wt 102.8 kg   LMP 06/08/2023 (Approximate)   SpO2 100%   BMI 35.51 kg/m   Patient Vitals for the past 24 hrs:  BP Temp Temp src Pulse Resp SpO2 Height Weight  09/16/23 1307 135/83 98.3 F (36.8 C) Oral 98 19 100 % -- --  09/16/23 1302 -- -- -- -- -- -- 5\' 7"  (1.702 m) 102.8 kg    Physical Exam Vitals reviewed.  Constitutional:      General: She is not in acute distress.    Appearance: She is well-developed. She is not diaphoretic.  Eyes:     General: No scleral icterus. Pulmonary:     Effort: Pulmonary effort is normal. No respiratory distress.  Skin:    General: Skin is warm and dry.  Neurological:     Mental Status: She is alert.     Coordination: Coordination normal.      Cervical Exam    Bedside Ultrasound Not performed.  My interpretation: n/a   Labs Results for orders placed or performed during the hospital encounter of 09/16/23 (from the past 24 hour(s))  Pregnancy, urine POC     Status: Abnormal   Collection Time: 09/16/23  1:18 PM  Result Value Ref Range   Preg Test, Ur POSITIVE (A) NEGATIVE  Urinalysis, Routine w reflex microscopic -Urine, Clean Catch     Status: Abnormal   Collection Time: 09/16/23  1:20 PM  Result Value Ref Range   Color, Urine YELLOW YELLOW   APPearance HAZY (A) CLEAR   Specific Gravity, Urine 1.020 1.005 - 1.030   pH 6.0 5.0 - 8.0   Glucose, UA NEGATIVE NEGATIVE mg/dL   Hgb urine dipstick NEGATIVE NEGATIVE   Bilirubin Urine NEGATIVE NEGATIVE   Ketones, ur NEGATIVE NEGATIVE mg/dL   Protein, ur NEGATIVE NEGATIVE  mg/dL   Nitrite NEGATIVE NEGATIVE   Leukocytes,Ua NEGATIVE NEGATIVE  Wet prep, genital     Status: None   Collection Time: 09/16/23  1:30 PM   Specimen: Vaginal  Result Value Ref Range   Yeast Wet Prep HPF POC NONE SEEN NONE SEEN   Trich, Wet Prep NONE SEEN NONE SEEN   Clue Cells Wet Prep HPF POC NONE SEEN NONE SEEN   WBC, Wet Prep HPF POC <10 <10   Sperm NONE SEEN   hCG, quantitative, pregnancy     Status: Abnormal   Collection Time: 09/16/23  1:56 PM  Result Value Ref Range   hCG, Beta Chain, Quant, S 87 (H) <5 mIU/mL    Imaging No results found.  MAU Course  Procedures Lab Orders         Wet prep, genital         hCG, quantitative, pregnancy         Urinalysis, Routine w reflex microscopic -Urine, Clean Catch         Pregnancy, urine POC    No orders of the defined types were placed in this encounter.  Imaging Orders  No imaging studies ordered today    MDM Moderate (Level 3-4)  Assessment and Plan  #Pregnancy of unknown location Pregnancy of unknown location. Discussed with patient that the possibilities include ectopic pregnancy, normal pregnancy, or miscarriage. We discussed need to trend HCG to help determine the outcome of this pregnancy. Scheduled for repeat HCG at  [facility]  in two days. Emphasized that ectopic pregnancy is a life threatening condition if not diagnosed and treated in  a timely manner. Reviewed MAU return precautions of severe and progressively worsening abdominal pain, heavy vaginal bleeding soaking >1 pad/hour, or fever.   Dispo: discharged to home in stable condition    Venora Maples, MD/MPH 09/16/23 3:32 PM  Allergies as of 09/16/2023       Reactions   Penicillins Other (See Comments)   Unknown reaction-childhood allergy  Has patient had a PCN reaction causing immediate rash, facial/tongue/throat swelling, SOB or lightheadedness with hypotension: Unknown Has patient had a PCN reaction causing severe rash involving mucus  membranes or skin necrosis: Unknown Has patient had a PCN reaction that required hospitalization: Unknown Has patient had a PCN reaction occurring within the last 10 years: Unknown If all of the above answers are "NO", then may proceed with Cephalosporin use.        Medication List     STOP taking these medications    norethindrone 5 MG tablet Commonly known as: Aygestin       TAKE these medications    Albuterol Sulfate (sensor) 108 (90 Base) MCG/ACT Aepb Inhale into the lungs.   benzoyl peroxide-erythromycin gel Commonly known as: Benzamycin Apply topically 2 (two) times daily. Can bleach fabric; allow to dry completely. Cut back to once daily if too drying on skin   calcium carbonate 750 MG chewable tablet Commonly known as: TUMS EX Chew by mouth.   calcium citrate 950 (200 Ca) MG tablet Commonly known as: CALCITRATE - dosed in mg elemental calcium Take 200 mg of elemental calcium by mouth daily.   cetirizine 10 MG tablet Commonly known as: ZYRTEC Take by mouth.   escitalopram 20 MG tablet Commonly known as: LEXAPRO TAKE 1 TABLET BY MOUTH ONCE A DAY   ferrous sulfate 325 (65 FE) MG tablet Take 1 tablet by mouth daily with breakfast.   fluticasone 50 MCG/ACT nasal spray Commonly known as: FLONASE Place into the nose.   hydrOXYzine 10 MG tablet Commonly known as: ATARAX Take 1 tablet (10 mg total) by mouth 3 (three) times daily as needed for anxiety.   lamoTRIgine 25 MG tablet Commonly known as: LAMICTAL Take 1 tablet (25 mg total) by mouth 2 (two) times daily.   multivitamins with iron Tabs tablet Take 1 tablet by mouth daily.   omeprazole 20 MG capsule Commonly known as: PRILOSEC TAKE 1 CAPSULE EVERY DAY FOR ACID REFLUX AS NEEDED   ondansetron 8 MG disintegrating tablet Commonly known as: ZOFRAN-ODT Take by mouth.   promethazine 25 MG tablet Commonly known as: PHENERGAN Take 25 mg by mouth every 6 (six) hours as needed.   SUMAtriptan 50 MG  tablet Commonly known as: IMITREX

## 2023-09-16 NOTE — MAU Note (Signed)
Tiffany Sanchez is a 27 y.o. at Unknown here in MAU reporting: she's here for abdominal cramping and VB that began last Thursday.  Reports VB is small today, but was moderate over the weekend.   LMP: Hx of irregular cycles Onset of complaint: Thursday last week Pain score: 6 Vitals:   09/16/23 1307  BP: 135/83  Pulse: 98  Resp: 19  Temp: 98.3 F (36.8 C)  SpO2: 100%     FHT:NA Lab orders placed from triage:   UPT

## 2023-09-16 NOTE — Discharge Instructions (Signed)
You were seen in the MAU for abdominal pain. We did blood tests and an ultrasound. You are pregnant, but at present we are not sure if you have an ectopic pregnancy, a normal pregnancy, or a miscarriage. To determine this you will need to have a repeat test of your pregnancy hormone level done in two days. We will schedule you an appointment to have your blood drawn at [facility] on Wednesday October 30th at 08:15 AM.   If you develop severe and progressively worsening abdominal pain, heavy vaginal bleeding soaking >1 pad/hour, or fever, return to the MAU or the nearest hospital immediately.

## 2023-09-17 LAB — GC/CHLAMYDIA PROBE AMP (~~LOC~~) NOT AT ARMC
Chlamydia: NEGATIVE
Comment: NEGATIVE
Comment: NORMAL
Neisseria Gonorrhea: NEGATIVE

## 2023-09-18 ENCOUNTER — Telehealth: Payer: Self-pay | Admitting: *Deleted

## 2023-09-18 ENCOUNTER — Other Ambulatory Visit: Payer: Medicaid Other

## 2023-09-18 DIAGNOSIS — O3680X0 Pregnancy with inconclusive fetal viability, not applicable or unspecified: Secondary | ICD-10-CM

## 2023-09-18 LAB — BETA HCG QUANT (REF LAB): hCG Quant: 143 m[IU]/mL

## 2023-09-18 NOTE — Telephone Encounter (Signed)
Pt informed of HCG results and they want to repeat the lab again on Friday. Appt made.

## 2023-09-20 ENCOUNTER — Other Ambulatory Visit: Payer: Medicaid Other

## 2023-09-20 DIAGNOSIS — O3680X0 Pregnancy with inconclusive fetal viability, not applicable or unspecified: Secondary | ICD-10-CM

## 2023-09-20 LAB — BETA HCG QUANT (REF LAB): hCG Quant: 253 m[IU]/mL

## 2023-09-23 ENCOUNTER — Other Ambulatory Visit: Payer: Medicaid Other

## 2023-09-23 DIAGNOSIS — O3680X0 Pregnancy with inconclusive fetal viability, not applicable or unspecified: Secondary | ICD-10-CM

## 2023-09-24 LAB — BETA HCG QUANT (REF LAB): hCG Quant: 413 m[IU]/mL

## 2023-09-25 ENCOUNTER — Other Ambulatory Visit: Payer: Medicaid Other

## 2023-09-26 ENCOUNTER — Encounter: Payer: Self-pay | Admitting: *Deleted

## 2023-09-30 ENCOUNTER — Other Ambulatory Visit: Payer: Medicaid Other

## 2023-09-30 DIAGNOSIS — O3680X0 Pregnancy with inconclusive fetal viability, not applicable or unspecified: Secondary | ICD-10-CM

## 2023-10-01 LAB — BETA HCG QUANT (REF LAB): hCG Quant: 1224 m[IU]/mL

## 2023-10-08 IMAGING — US US PELVIS COMPLETE WITH TRANSVAGINAL
1 series · 15 of 25 positions shown · non-contrast
Comparison: Pelvic ultrasound 07/15/2020

CLINICAL DATA: Abnormal uterine bleeding

EXAM:
TRANSABDOMINAL AND TRANSVAGINAL ULTRASOUND OF PELVIS
TECHNIQUE: Both transabdominal and transvaginal ultrasound examinations of the
pelvis were performed. Transabdominal technique was performed for
global imaging of the pelvis including uterus, ovaries, adnexal
regions, and pelvic cul-de-sac. It was necessary to proceed with
endovaginal exam following the transabdominal exam to visualize the
uterus endometrium ovaries.

[Series 1: us pelvis complete with transvaginal · 15 of 45 slices shown]
[im 1/45]
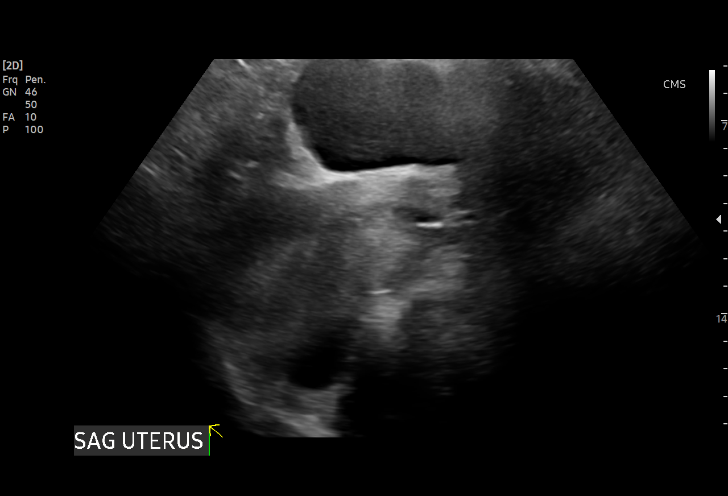
[im 4/45]
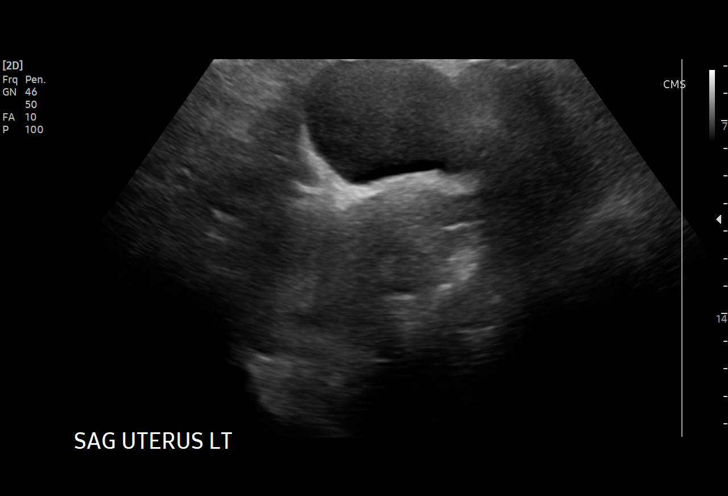
[im 8/45]
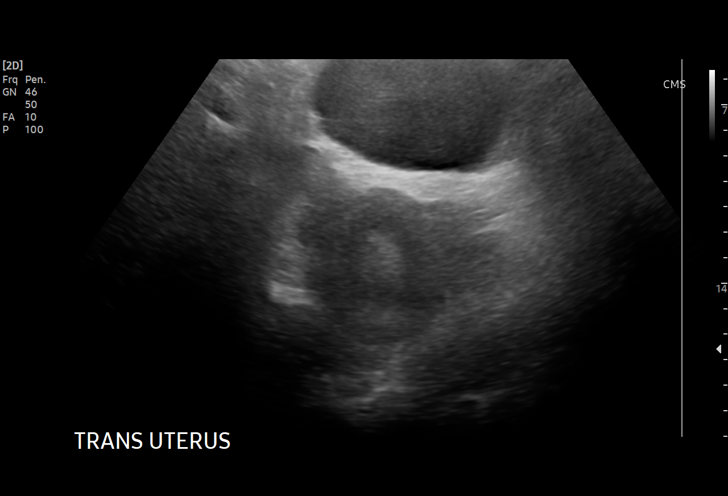
[im 10/45]
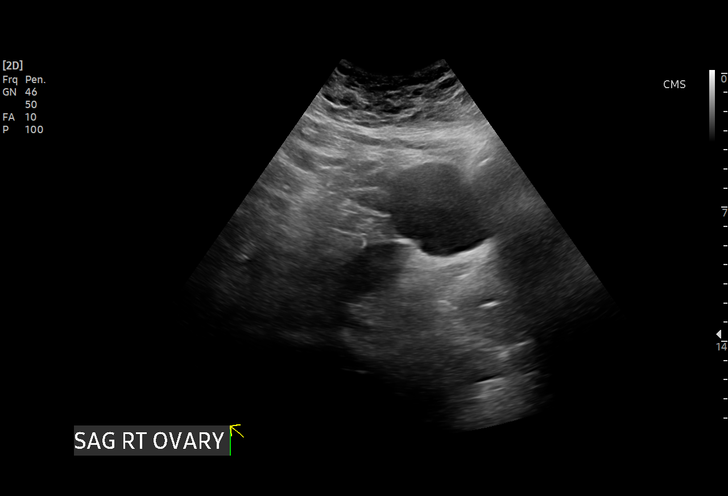
[im 13/45]
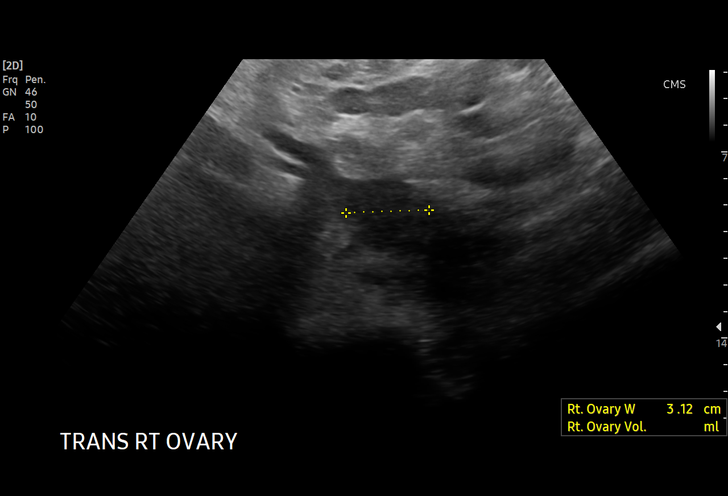
[im 17/45]
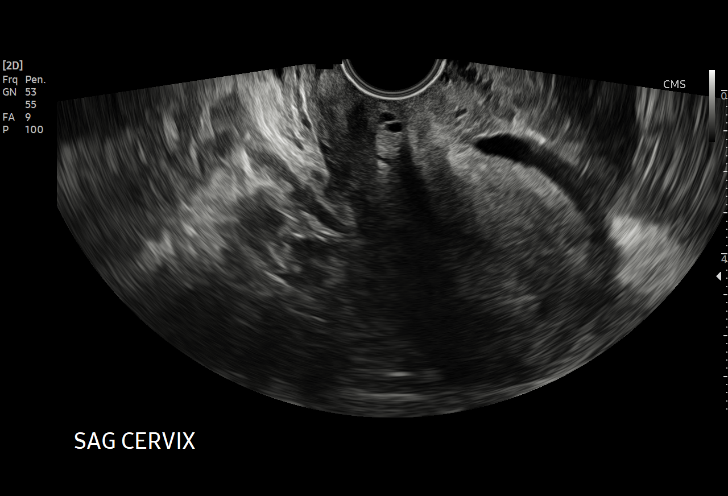
[im 19/45]
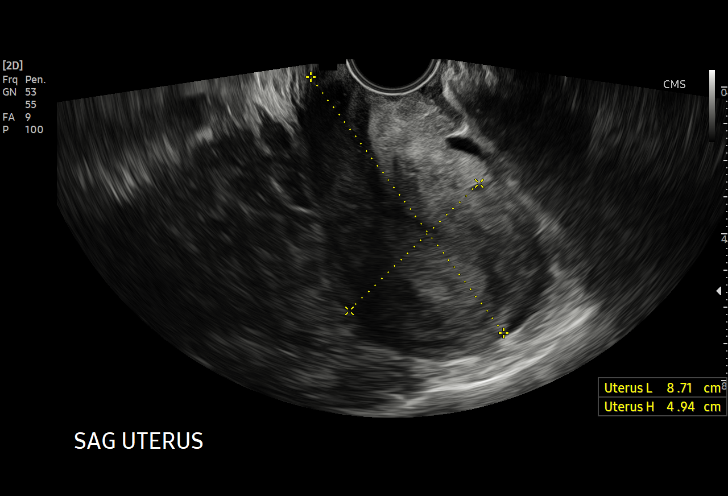
[im 23/45]
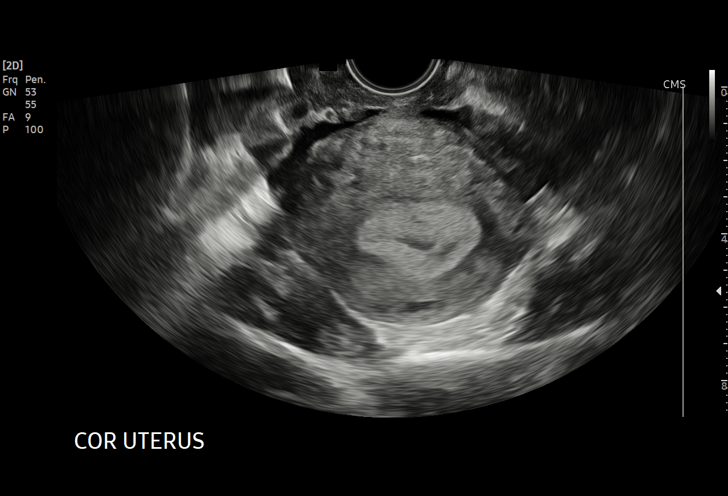
[im 26/45]
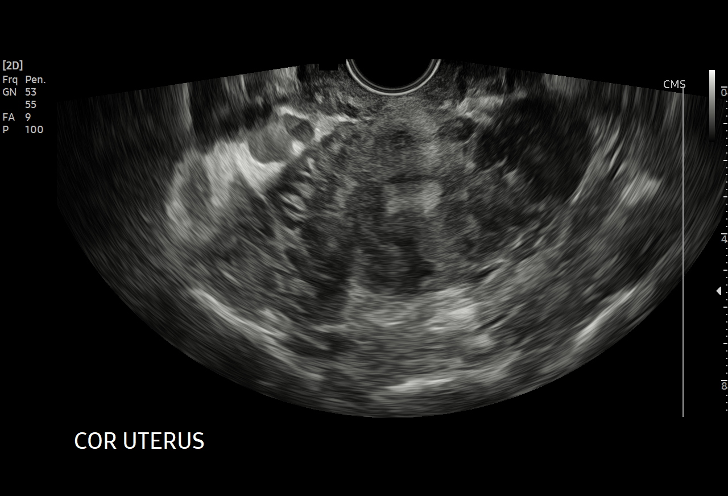
[im 28/45]
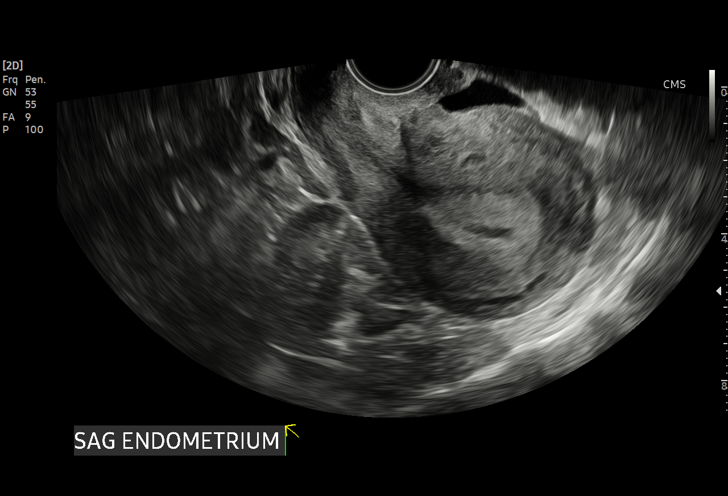
[im 32/45]
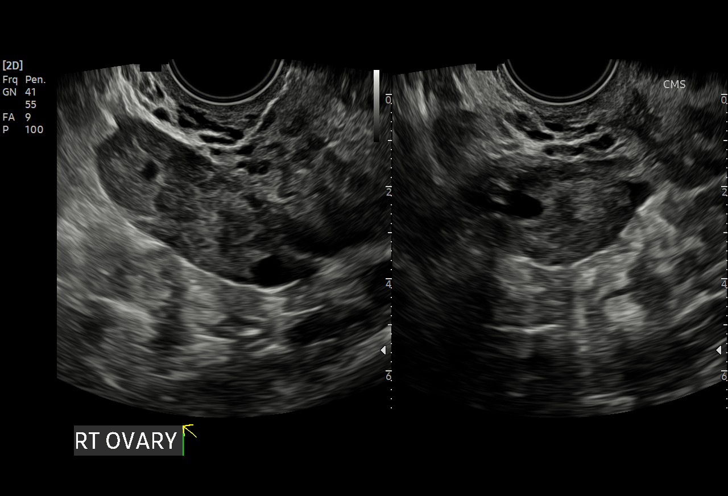
[im 35/45]
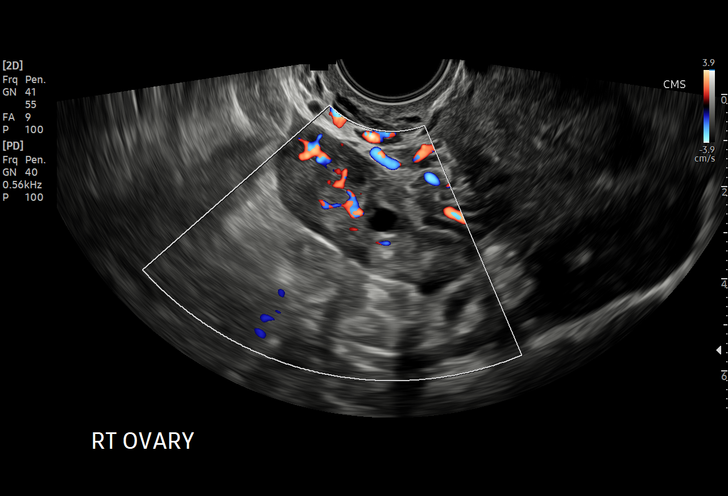
[im 37/45]
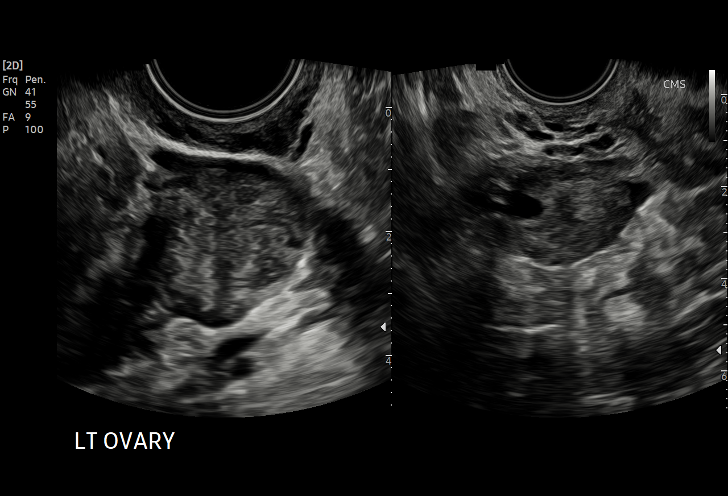
[im 41/45]
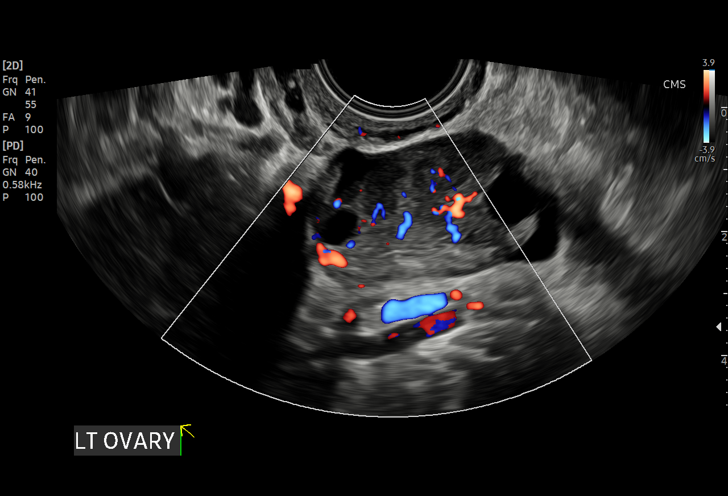
[im 45/45]
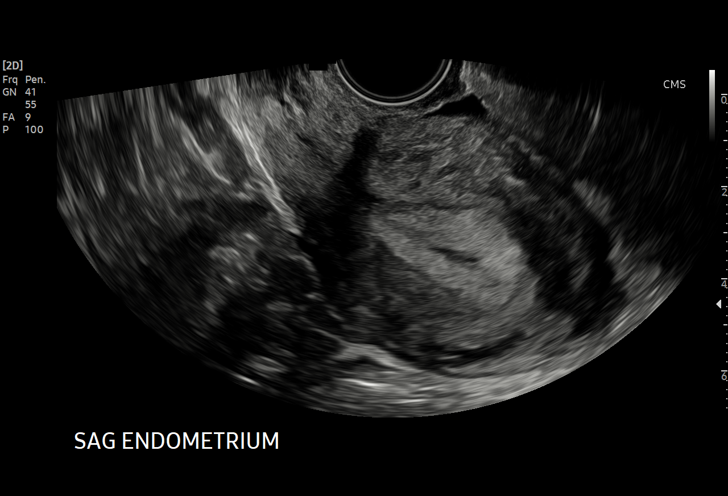

[15 of 25 positions shown; findings below may reference images not displayed]

FINDINGS: Uterus

Measurements: 8.7 x 4.9 x 6.5 cm = volume: 173.8 mL. No fibroids or
other mass visualized.

Endometrium

Thickness: 13 mm.  Trace fluid within the endometrial canal

Right ovary

Measurements: 5.1 x 2.2 x 2.9 cm = volume: 16.9 mL. Normal
appearance/no adnexal mass.

Left ovary

Measurements: 3.8 x 2.2 x 2.6 cm = volume: 11.6 mL. Normal
appearance/no adnexal mass.

Other findings

Small free fluid in the pelvis.
IMPRESSION: Small free fluid in the pelvis. Otherwise negative pelvic ultrasound

## 2023-10-10 ENCOUNTER — Other Ambulatory Visit (INDEPENDENT_AMBULATORY_CARE_PROVIDER_SITE_OTHER): Payer: Self-pay

## 2023-10-10 ENCOUNTER — Ambulatory Visit: Payer: Medicaid Other | Admitting: *Deleted

## 2023-10-10 VITALS — BP 140/87 | HR 92

## 2023-10-10 DIAGNOSIS — O3680X0 Pregnancy with inconclusive fetal viability, not applicable or unspecified: Secondary | ICD-10-CM

## 2023-10-10 DIAGNOSIS — Z3490 Encounter for supervision of normal pregnancy, unspecified, unspecified trimester: Secondary | ICD-10-CM

## 2023-10-10 DIAGNOSIS — Z3A01 Less than 8 weeks gestation of pregnancy: Secondary | ICD-10-CM

## 2023-10-10 NOTE — Progress Notes (Signed)
New OB Intake  I explained I am completing OB intake/Dating scan.Marland Kitchen LMP unknown, thinks maybe the first week of September, has irregular cycles. Last HCG on 09/30/23 was 1,224. Pt is [id number]. I reviewed her allergies, medications and Medical/Surgical/OB history.    Patient Active Problem List   Diagnosis Date Noted   S/P bariatric surgery 05/08/2023   Asthma 09/19/2022   Bipolar II disorder, mild, depressed, with anxious distress (HCC) 09/19/2022   Insomnia 09/19/2022   Risk factors for obstructive sleep apnea 08/20/2022   Abnormal uterine bleeding 06/06/2022   Hypertension, benign essential, goal below 140/90 01/25/2022   GERD (gastroesophageal reflux disease) 01/25/2022   Menorrhagia with regular cycle 01/10/2022   Dysplasia of cervix, low grade (CIN 1) 12/22/2020   Low grade squamous intraepithelial lesion (LGSIL) at risk for high grade lesion on pap smear 11/10/20 11/10/2020   Family history of breast cancer 10/22/2019   BMI 50.0-59.9, adult (HCC) 06/03/2018   Genital herpes 06/03/2018   History of gestational hypertension 12/20/2017   History of shoulder dystocia in prior pregnancy, currently pregnant 12/20/2017    Concerns addressed today  Patient informed that the ultrasound is considered a limited obstetric ultrasound and is not intended to be a complete ultrasound exam.  Patient also informed that the ultrasound is not being completed with the intent of assessing for fetal or placental anomalies or any pelvic abnormalities. Explained that the purpose of today's ultrasound is to assess for viability.  Patient acknowledges the purpose of the exam and the limitations of the study.       On today's scan only a gestation sac was seen, no fetal pole or yolk sac. Pt to follow up in 11-14 days for repeat scan.     Scheryl Marten, RN 10/10/2023  8:52 AM

## 2023-10-10 NOTE — Progress Notes (Signed)
Patient was assessed and managed by nursing staff during this encounter. I have reviewed the chart and agree with the documentation and plan. I have also made any necessary editorial changes.  [place] Bing, MD 10/10/2023 2:08 PM

## 2023-10-20 ENCOUNTER — Other Ambulatory Visit: Payer: Self-pay

## 2023-10-20 ENCOUNTER — Inpatient Hospital Stay (HOSPITAL_COMMUNITY)
Admission: AD | Admit: 2023-10-20 | Discharge: 2023-10-20 | Disposition: A | Discharge status: Home / Self Care | Payer: Medicaid Other | Attending: Obstetrics and Gynecology | Admitting: Obstetrics and Gynecology

## 2023-10-20 ENCOUNTER — Inpatient Hospital Stay (HOSPITAL_COMMUNITY): Payer: Medicaid Other

## 2023-10-20 DIAGNOSIS — O039 Complete or unspecified spontaneous abortion without complication: Secondary | ICD-10-CM | POA: Insufficient documentation

## 2023-10-20 DIAGNOSIS — Z3A11 11 weeks gestation of pregnancy: Secondary | ICD-10-CM

## 2023-10-20 DIAGNOSIS — O26859 Spotting complicating pregnancy, unspecified trimester: Secondary | ICD-10-CM

## 2023-10-20 LAB — URINALYSIS, ROUTINE W REFLEX MICROSCOPIC
Bilirubin Urine: NEGATIVE
Glucose, UA: NEGATIVE mg/dL
Hgb urine dipstick: NEGATIVE
Ketones, ur: NEGATIVE mg/dL
Nitrite: NEGATIVE
Protein, ur: NEGATIVE mg/dL
Specific Gravity, Urine: 1.009 (ref 1.005–1.030)
pH: 7 (ref 5.0–8.0)

## 2023-10-20 LAB — HCG, QUANTITATIVE, PREGNANCY: hCG, Beta Chain, Quant, S: 2957 m[IU]/mL — ABNORMAL HIGH (ref <5)

## 2023-10-20 MED ORDER — MISOPROSTOL 200 MCG PO TABS: 800.0000 ug | ORAL_TABLET | Freq: Once | ORAL | 0 refills | Status: DC

## 2023-10-20 MED ORDER — OXYCODONE-ACETAMINOPHEN 5-325 MG PO TABS: 1.0000 | ORAL_TABLET | ORAL | 0 refills | Status: DC | PRN

## 2023-10-20 NOTE — MAU Provider Note (Signed)
History     CSN: [id number]  Arrival date and time: 10/20/23 1139   None     Chief Complaint  Patient presents with   Cramping   Back Pain   Spotting   HPI  [name] is a 27 y.o. female [phone] @ [redacted]w[redacted]d here in MAU with vaginal spotting. The spotting is light and red. The spotting started on Friday.  OB History     Gravida  5   Para  2   Term  2   Preterm      AB  2   Living  2      SAB      IAB  1   Ectopic      Multiple  0   Live Births  2           Past Medical History:  Diagnosis Date   Anemia    Asthma    no hospitalizations, no recent attacks (since childhood)   Cervical dysplasia    Complication of anesthesia    one sided epidural   Genital herpes 06/03/2018   History of gestational hypertension    Hypertension, benign essential, goal below 140/90 01/25/2022   Low grade squamous intraepithelial lesion (LGSIL) at risk for high grade squamous intraepithelial lesion (HGSIL) on cytologic smear of cervix 11/10/2020   Given concern about high grade lesion, colposcopy recommended. Of note, also had LGSIL pap in 2018 ([facility] results), but had normal pap in 2019.    Ovarian cyst    Trichomonal vaginitis 04/20/2020   Late may 2021   UTI (urinary tract infection)     Past Surgical History:  Procedure Laterality Date   CESAREAN SECTION N/A 06/28/2018   Procedure: CESAREAN SECTION;  Surgeon: Levie Heritage, DO;  Location: [facility];  Service: Obstetrics;  Laterality: N/A;   THERAPEUTIC ABORTION      Family History  Problem Relation Age of Onset   Alcohol abuse Mother    Depression Mother    Hypertension Mother    Hypertension Father    Drug abuse Brother    Bipolar disorder Brother    Diabetes Paternal Grandfather     Social History   Tobacco Use   Smoking status: Never   Smokeless tobacco: Never  Vaping Use   Vaping status: Never Used  Substance Use Topics   Alcohol use: Yes    Comment: every 2-3 months    Drug use: No    Allergies:  Allergies  Allergen Reactions   Penicillins Other (See Comments)    Unknown reaction-childhood allergy  Has patient had a PCN reaction causing immediate rash, facial/tongue/throat swelling, SOB or lightheadedness with hypotension: Unknown Has patient had a PCN reaction causing severe rash involving mucus membranes or skin necrosis: Unknown Has patient had a PCN reaction that required hospitalization: Unknown Has patient had a PCN reaction occurring within the last 10 years: Unknown If all of the above answers are "NO", then may proceed with Cephalosporin use.     Medications Prior to Admission  Medication Sig Dispense Refill Last Dose   Albuterol Sulfate, sensor, 108 (90 Base) MCG/ACT AEPB Inhale into the lungs. (Patient not taking: Reported on 02/28/2023)      benzoyl peroxide-erythromycin (BENZAMYCIN) gel Apply topically 2 (two) times daily. Can bleach fabric; allow to dry completely. Cut back to once daily if too drying on skin 23.3 g 0    calcium carbonate (TUMS EX) 750 MG chewable tablet Chew by mouth.  calcium citrate (CALCITRATE - DOSED IN MG ELEMENTAL CALCIUM) 950 (200 Ca) MG tablet Take 200 mg of elemental calcium by mouth daily.      cetirizine (ZYRTEC) 10 MG tablet Take by mouth.      escitalopram (LEXAPRO) 20 MG tablet TAKE 1 TABLET BY MOUTH ONCE A DAY 90 tablet 0    ferrous sulfate 325 (65 FE) MG tablet Take 1 tablet by mouth daily with breakfast.      fluticasone (FLONASE) 50 MCG/ACT nasal spray Place into the nose.      hydrOXYzine (ATARAX) 10 MG tablet Take 1 tablet (10 mg total) by mouth 3 (three) times daily as needed for anxiety. 90 tablet 1    lamoTRIgine (LAMICTAL) 25 MG tablet Take 1 tablet (25 mg total) by mouth 2 (two) times daily. 180 tablet 0    Multiple Vitamins-Iron (MULTIVITAMINS WITH IRON) TABS tablet Take 1 tablet by mouth daily.      omeprazole (PRILOSEC) 20 MG capsule TAKE 1 CAPSULE EVERY DAY FOR ACID REFLUX AS NEEDED       ondansetron (ZOFRAN-ODT) 8 MG disintegrating tablet Take by mouth.      promethazine (PHENERGAN) 25 MG tablet Take 25 mg by mouth every 6 (six) hours as needed.      SUMAtriptan (IMITREX) 50 MG tablet        Review of Systems Physical Exam   Blood pressure (!) 153/72, pulse (!) 103, temperature 98.5 F (36.9 C), temperature source Oral, resp. rate 18, height 5\' 7"  (1.702 m), weight 108 kg, last menstrual period 08/01/2023, SpO2 100%.  Physical Exam  MAU Course  Procedures  MDM ***  Assessment and Plan  ***  Tiffany Sanchez 10/20/2023, 3:35 PM

## 2023-10-20 NOTE — MAU Note (Signed)
Tiffany Sanchez is a 27 y.o. at Unknown here in MAU reporting: she's having lower abdominal cramping and back pain that began one day ago.  Reports both cramping and back pain are constant.  Reports took one extra strength Tylenol last night, some relief noted.  Also reports scant VB.   LMP: 08/01/2023 Onset of complaint: 1 day Pain score: 6 Vitals:   10/20/23 1216  BP: (!) 153/72  Pulse: (!) 103  Resp: 18  Temp: 98.5 F (36.9 C)  SpO2: 100%     FHT:NA based on ultrasound done 09/2023 Lab orders placed from triage:   UA

## 2023-10-21 LAB — CULTURE, OB URINE

## 2023-10-23 ENCOUNTER — Ambulatory Visit: Payer: Medicaid Other | Admitting: Family Medicine

## 2023-10-23 ENCOUNTER — Encounter: Payer: Self-pay | Admitting: Family Medicine

## 2023-10-23 NOTE — Progress Notes (Signed)
Patient did not keep appointment today. She may call to reschedule.  

## 2023-10-30 ENCOUNTER — Ambulatory Visit: Payer: Medicaid Other | Admitting: Psychiatry

## 2023-10-31 ENCOUNTER — Other Ambulatory Visit: Payer: Self-pay | Admitting: Psychiatry

## 2023-10-31 ENCOUNTER — Inpatient Hospital Stay (HOSPITAL_COMMUNITY)
Admission: AD | Admit: 2023-10-31 | Discharge: 2023-10-31 | Disposition: A | Discharge status: Home / Self Care | Payer: Medicaid Other | Attending: Obstetrics and Gynecology | Admitting: Obstetrics and Gynecology

## 2023-10-31 ENCOUNTER — Inpatient Hospital Stay (HOSPITAL_COMMUNITY): Payer: Medicaid Other

## 2023-10-31 ENCOUNTER — Encounter (HOSPITAL_COMMUNITY): Payer: Self-pay | Admitting: Obstetrics and Gynecology

## 2023-10-31 DIAGNOSIS — F3181 Bipolar II disorder: Secondary | ICD-10-CM

## 2023-10-31 DIAGNOSIS — O034 Incomplete spontaneous abortion without complication: Secondary | ICD-10-CM | POA: Diagnosis present

## 2023-10-31 DIAGNOSIS — N939 Abnormal uterine and vaginal bleeding, unspecified: Secondary | ICD-10-CM

## 2023-10-31 DIAGNOSIS — Z3A13 13 weeks gestation of pregnancy: Secondary | ICD-10-CM | POA: Diagnosis not present

## 2023-10-31 LAB — CBC
HCT: 36 % (ref 36.0–46.0)
Hemoglobin: 11.3 g/dL — ABNORMAL LOW (ref 12.0–15.0)
MCH: 24.6 pg — ABNORMAL LOW (ref 26.0–34.0)
MCHC: 31.4 g/dL (ref 30.0–36.0)
MCV: 78.3 fL — ABNORMAL LOW (ref 80.0–100.0)
Platelets: 393 10*3/uL (ref 150–400)
RBC: 4.6 MIL/uL (ref 3.87–5.11)
RDW: 14.6 % (ref 11.5–15.5)
WBC: 8.5 10*3/uL (ref 4.0–10.5)
nRBC: 0 % (ref 0.0–0.2)

## 2023-10-31 LAB — TYPE AND SCREEN
ABO/RH(D): A POS
Antibody Screen: NEGATIVE

## 2023-10-31 MED ORDER — MISOPROSTOL 200 MCG PO TABS: 800.0000 ug | ORAL_TABLET | Freq: Once | ORAL | 0 refills | Status: DC

## 2023-10-31 MED ORDER — LAMOTRIGINE 25 MG PO TABS: 25.0000 mg | ORAL_TABLET | Freq: Two times a day (BID) | ORAL | 0 refills | Status: DC

## 2023-10-31 NOTE — Telephone Encounter (Signed)
Lamictal renewed and sent to pharmacy at [facility].

## 2023-10-31 NOTE — Discharge Instructions (Signed)
Return to MAU: If you have heavier bleeding that soaks through more that 2 pads per hour for an hour or more If you bleed so much that you feel like you might pass out or you do pass out If you have significant abdominal pain that is not improved with Tylenol 1000 mg every 8 hours as needed for pain If you develop a fever > 100.5  

## 2023-10-31 NOTE — MAU Provider Note (Addendum)
History     CSN: [id number]  Arrival date and time: 10/31/23 1743   Event Date/Time   First Provider Initiated Contact with Patient 10/31/23 2157      Chief Complaint  Patient presents with   Vaginal Bleeding   HPI Tiffany Sanchez is a 27 y.o. year old [id number] female at [redacted]w[redacted]d weeks gestation who presents to MAU reporting she was seen in MAU on 12-24 and diagnosed with a nonviable pregnancy.  She was prescribed Cytotec to take on 12-24 and 10/22/2023.  She reports she started having vaginal bleeding like a.  And passing clots on 10/23/2023 this week she reports the vaginal bleeding became heavier and passing larger size clots with cramping and changing her pad every hour.  She reports that her pain is 5-6/10.  She reports that Tylenol does help with the pain.  She plans to follow-up post miscarriage at [facility].   OB History     Gravida  5   Para  2   Term  2   Preterm      AB  2   Living  2      SAB      IAB  1   Ectopic      Multiple  0   Live Births  2           Past Medical History:  Diagnosis Date   Anemia    Asthma    no hospitalizations, no recent attacks (since childhood)   Cervical dysplasia    Complication of anesthesia    one sided epidural   Genital herpes 06/03/2018   History of gestational hypertension    Hypertension, benign essential, goal below 140/90 01/25/2022   Low grade squamous intraepithelial lesion (LGSIL) at risk for high grade squamous intraepithelial lesion (HGSIL) on cytologic smear of cervix 11/10/2020   Given concern about high grade lesion, colposcopy recommended. Of note, also had LGSIL pap in 2018 ([facility] results), but had normal pap in 2019.    Ovarian cyst    Trichomonal vaginitis 04/20/2020   Late may 2021   UTI (urinary tract infection)     Past Surgical History:  Procedure Laterality Date   CESAREAN SECTION N/A 06/28/2018   Procedure: CESAREAN SECTION;  Surgeon:  Levie Heritage, DO;  [facility]: [facility];  Service: Obstetrics;  Laterality: N/A;   THERAPEUTIC ABORTION      Family History  Problem Relation Age of Onset   Alcohol abuse Mother    Depression Mother    Hypertension Mother    Hypertension Father    Drug abuse Brother    Bipolar disorder Brother    Diabetes Paternal Grandfather     Social History   Tobacco Use   Smoking status: Never   Smokeless tobacco: Never  Vaping Use   Vaping status: Never Used  Substance Use Topics   Alcohol use: Yes    Comment: every 2-3 months   Drug use: No    Allergies:  Allergies  Allergen Reactions   Penicillins Other (See Comments)    Unknown reaction-childhood allergy  Has patient had a PCN reaction causing immediate rash, facial/tongue/throat swelling, SOB or lightheadedness with hypotension: Unknown Has patient had a PCN reaction causing severe rash involving mucus membranes or skin necrosis: Unknown Has patient had a PCN reaction that required hospitalization: Unknown Has patient had a PCN reaction occurring within the last 10 years: Unknown If all of the above  answers are "NO", then may proceed with Cephalosporin use.     No medications prior to admission.    Review of Systems  Constitutional: Negative.   HENT: Negative.    Eyes: Negative.   Respiratory: Negative.    Cardiovascular: Negative.   Gastrointestinal: Negative.   Endocrine: Negative.   Genitourinary:  Positive for vaginal bleeding.  Musculoskeletal: Negative.   Skin: Negative.   Allergic/Immunologic: Negative.   Neurological: Negative.   Hematological: Negative.   Psychiatric/Behavioral: Negative.     Physical Exam   Blood pressure 119/84, pulse 97, temperature 98.3 F (36.8 C), resp. rate 19, height 5\' 7"  (1.702 m), weight 103.9 kg, last menstrual period 08/01/2023.  Physical Exam Vitals and nursing note reviewed.  Constitutional:      Appearance: Normal appearance. She is obese.   Cardiovascular:     Rate and Rhythm: Normal rate.  Pulmonary:     Effort: Pulmonary effort is normal.  Musculoskeletal:        General: Normal range of motion.  Neurological:     Mental Status: She is alert and oriented to person, place, and time.  Psychiatric:        Mood and Affect: Mood normal.        Behavior: Behavior normal.        Thought Content: Thought content normal.        Judgment: Judgment normal.    MAU Course  Procedures  MDM CBC ABO/Rh  Results for orders placed or performed during the hospital encounter of 10/31/23 (from the past 24 hours)  Type and screen     Status: None   Collection Time: 10/31/23  7:42 PM  Result Value Ref Range   ABO/RH(D) A POS    Antibody Screen NEG    Sample Expiration      11/03/2023,2359 Performed at [facility], [address], [place], [place] [address]   CBC     Status: Abnormal   Collection Time: 10/31/23  7:44 PM  Result Value Ref Range   WBC 8.5 4.0 - 10.5 K/uL   RBC 4.60 3.87 - 5.11 MIL/uL   Hemoglobin 11.3 (L) 12.0 - 15.0 g/dL   HCT 51.8 84.1 - 66.0 %   MCV 78.3 (L) 80.0 - 100.0 fL   MCH 24.6 (L) 26.0 - 34.0 pg   MCHC 31.4 30.0 - 36.0 g/dL   RDW 63.0 16.0 - 10.9 %   Platelets 393 150 - 400 K/uL   nRBC 0.0 0.0 - 0.2 %    US PELVIS TRANSVAGINAL NON-OB (TV ONLY) Result Date: 10/31/2023 CLINICAL DATA:  Vaginal bleeding, medical abortion 10/22/2023 EXAM: ULTRASOUND PELVIS TRANSVAGINAL TECHNIQUE: Transvaginal ultrasound examination of the pelvis was performed including evaluation of the uterus, ovaries, adnexal regions, and pelvic cul-de-sac. COMPARISON:  10/20/2023 FINDINGS: Uterus Measurements: 8.7 x 6.1 x 7.5 cm = volume: 280.4 mL. Probable intramural fibroid along the right lateral margin of the uterus measuring 1.6 x 0.8 x 1.3 cm. No other uterine findings. Endometrium Thickness: 9 mm. Endometrium is mildly heterogeneous, with small amount of fluid in the endometrial cavity and increased vascularity on color  Doppler imaging. Findings could reflect retained products of conception. No endometrial masses. Right ovary Measurements: 3.6 x 2.1 x 1.5 cm = volume: 5.9 mL. Normal appearance/no adnexal mass. Left ovary Measurements: 2.7 x 2.6 x 2.6 cm = volume: 9.5 mL. Normal appearance/no adnexal mass. Other findings:  No free fluid or pelvic mass. IMPRESSION: 1. Heterogeneous appearance of the endometrium with a  small amount of fluid and increased vascularity which could reflect retained products of conception after medical abortion. 2. Small uterine fibroid. Electronically Signed   By: Sharlet Salina M.D.   On: 10/31/2023 20:42    Assessment and Plan  1. Retained products of conception after miscarriage (Primary) - Information provided on miscarriage  - Discussed additional dose of Cytotec can help uterus expel rest of PC and lessen amount VB - Prescription for: Cytotec 800 mcg po x 1 dose  2. Vagina bleeding - Return to MAU: If you have heavier bleeding that soaks through more that 2 pads per hour for an hour or more If you bleed so much that you feel like you might pass out or you do pass out If you have significant abdominal pain that is not improved with Tylenol 1000 mg every 8 hours as needed for pain If you develop a fever > 100.5    - Discharge patient - F/U with [facility] provider in 2 wks. Msg sent to [facility]. - Patient verbalized an understanding of the plan of care and agrees.  Raelyn Mora, CNM 10/31/2023, 9:57 PM

## 2023-10-31 NOTE — Progress Notes (Signed)
Not in lobby

## 2023-10-31 NOTE — MAU Note (Signed)
.  Tiffany Sanchez is a 27 y.o. at [redacted]w[redacted]d here in MAU reporting: was in MAU on 10/21/2023 and told she had  a nonviable pregnancy . Given a prescription for Cytotec given at home . Took it on 12/2 and 12/3 as prescribed . Started bleeding on 12/4 was like a period  and some clots coming out. This week bleeding is much heavier passing large clots and cramping.changing pad hourly.  Has taken tylenol for apin and it has helped some.  LMP:  Onset of complaint: Monday Pain score: 5-6 Vitals:   10/31/23 1901  BP: 138/81  Pulse: 97  Resp: (!) 97  Temp: 98.3 F (36.8 C)     FHT:n/a Lab orders placed from triage:

## 2023-11-01 ENCOUNTER — Encounter: Payer: Self-pay | Admitting: Family Medicine

## 2023-11-01 ENCOUNTER — Telehealth: Payer: Self-pay

## 2023-11-01 NOTE — Telephone Encounter (Signed)
Left message for pt to call office back regarding follow up appointment.

## 2023-11-05 ENCOUNTER — Telehealth: Payer: Self-pay

## 2023-11-05 DIAGNOSIS — Z201 Contact with and (suspected) exposure to tuberculosis: Secondary | ICD-10-CM

## 2023-11-05 NOTE — Telephone Encounter (Signed)
Referred for TB test by [facility] clinic due to contact investigation.   Phone call to patient to report possible exposure to TB disease in October or November.   Patient states she is feeling well, asymptomatic. Her last day of employment with [employer] was 10/17/2023.and she had a negative Quantiferon test in November, prior to new employment with [employer].   Patient advised to repeat Quantiferon test 8 weeks after last day of employment with [place] clinic.  Patient is a [facility] resident. Referred to county of residence health department to repeat test and follow-up.  Patient states understanding and agrees with plan. Lavinia Sharps, RN

## 2023-11-08 ENCOUNTER — Other Ambulatory Visit: Payer: Self-pay | Admitting: Psychiatry

## 2023-11-08 DIAGNOSIS — F3181 Bipolar II disorder: Secondary | ICD-10-CM

## 2023-11-26 ENCOUNTER — Encounter: Payer: Self-pay | Admitting: Obstetrics & Gynecology

## 2023-11-26 ENCOUNTER — Ambulatory Visit: Payer: Medicaid Other | Admitting: Obstetrics & Gynecology

## 2023-11-26 ENCOUNTER — Other Ambulatory Visit (HOSPITAL_COMMUNITY)
Admission: RE | Admit: 2023-11-26 | Discharge: 2023-11-26 | Disposition: A | Discharge status: Home / Self Care | Payer: Medicaid Other | Source: Ambulatory Visit | Attending: Obstetrics & Gynecology | Admitting: Obstetrics & Gynecology

## 2023-11-26 ENCOUNTER — Encounter: Payer: Self-pay | Admitting: *Deleted

## 2023-11-26 VITALS — BP 130/85 | HR 81 | Wt 232.0 lb

## 2023-11-26 DIAGNOSIS — O039 Complete or unspecified spontaneous abortion without complication: Secondary | ICD-10-CM

## 2023-11-26 DIAGNOSIS — Z5189 Encounter for other specified aftercare: Secondary | ICD-10-CM | POA: Diagnosis not present

## 2023-11-26 DIAGNOSIS — N898 Other specified noninflammatory disorders of vagina: Secondary | ICD-10-CM

## 2023-11-26 DIAGNOSIS — R399 Unspecified symptoms and signs involving the genitourinary system: Secondary | ICD-10-CM | POA: Diagnosis not present

## 2023-11-26 DIAGNOSIS — N939 Abnormal uterine and vaginal bleeding, unspecified: Secondary | ICD-10-CM | POA: Diagnosis not present

## 2023-11-26 DIAGNOSIS — Z3202 Encounter for pregnancy test, result negative: Secondary | ICD-10-CM | POA: Diagnosis not present

## 2023-11-26 LAB — POCT URINALYSIS DIPSTICK
Blood, UA: NEGATIVE
Glucose, UA: NEGATIVE
Leukocytes, UA: NEGATIVE
Nitrite, UA: NEGATIVE
Protein, UA: NEGATIVE

## 2023-11-26 LAB — POCT URINE PREGNANCY: Preg Test, Ur: NEGATIVE

## 2023-11-26 MED ORDER — NORETHINDRONE ACETATE 5 MG PO TABS: 5.0000 mg | ORAL_TABLET | Freq: Every day | ORAL | 3 refills | Status: DC

## 2023-11-26 NOTE — Progress Notes (Signed)
 GYNECOLOGY OFFICE VISIT NOTE  History:   Tiffany Sanchez is a 28 y.o. [phone] here today for follow up visit after treatment of misoprostol  MAB in 10/2023. She has been sexually active, wants pregnancy test. Also reports UTI symptoms.  Desires refill of Aygestin , says her OB/GYN at [facility] prescribed this for her for her history of AUB. She denies any other concerns.  Of note, when asked why she followed by up here instead of her OB/GYN at [facility], she said this was where MAU staff sent her; she did not tell them she already had a different OB/GYN provider.  She is planning to follow up with Dr. Leonce at [place].    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 Of note, also had LGSIL pap in 2018 ([facility]Everywhere results), but had normal pap in 2019.    Ovarian cyst    Trichomonal vaginitis 04/20/2020   Late may 2021   UTI (urinary tract infection)     Past Surgical History:  Procedure Laterality Date   CESAREAN SECTION N/A 06/28/2018   Procedure: CESAREAN SECTION;  Surgeon: Barbra Lang PARAS, DO;  Location: [facility];  Service: Obstetrics;  Laterality: N/A;   THERAPEUTIC ABORTION      The following portions of the patient's history were reviewed and updated as appropriate: allergies, current medications, past family history, past medical history, past social history, past surgical history  and problem list.   Health Maintenance:  ASCUS pap and positive HRHPV on 06/02/2023, followed by colposcopy that showed CIN 2 on 07/26/2023, patient declined LEEP.  Review of Systems:  Pertinent items noted in HPI and remainder of comprehensive ROS otherwise negative.  Physical Exam:  BP 130/85   Pulse 81   Wt 232 lb (105.2 kg)   LMP 08/01/2023 (Approximate)   BMI 36.34 kg/m  CONSTITUTIONAL: Well-developed, well-nourished female in no acute distress.  SKIN: No rash noted. Not diaphoretic. No erythema. No pallor. MUSCULOSKELETAL: Normal range of motion. No edema noted. NEUROLOGIC: Alert and oriented to person, place, and time. Normal muscle tone coordination. No cranial nerve deficit noted. PSYCHIATRIC: Normal mood and affect. Normal behavior. Normal judgment and thought content. CARDIOVASCULAR: Normal heart rate noted RESPIRATORY: Effort and breath sounds normal, no problems with respiration noted ABDOMEN: No masses noted. No other overt distention noted.   PELVIC: Deferred  Labs and Imaging Results for orders placed or performed in visit on 11/26/23 (from the past week)  POCT urine pregnancy   Collection Time: 11/26/23  1:40 PM  Result Value Ref Range   Preg Test, Ur Negative Negative  POCT Urinalysis Dipstick   Collection Time: 11/26/23  1:40 PM  Result Value Ref Range   Color, UA     Clarity, UA     Glucose, UA Negative Negative   Bilirubin, UA     Ketones, UA     Spec Grav, UA  Blood, UA negative    pH, UA     Protein, UA Negative Negative   Urobilinogen, UA     Nitrite, UA negative    Leukocytes, UA Negative Negative   Appearance     Odor     US  PELVIS TRANSVAGINAL NON-OB (TV ONLY) Result Date: 10/31/2023 CLINICAL DATA:  Vaginal bleeding, medical abortion 10/22/2023 EXAM: ULTRASOUND PELVIS TRANSVAGINAL TECHNIQUE: Transvaginal ultrasound examination of the pelvis was performed including evaluation of the uterus, ovaries, adnexal regions, and pelvic cul-de-sac.  COMPARISON:  10/20/2023 FINDINGS: Uterus Measurements: 8.7 x 6.1 x 7.5 cm = volume: 280.4 mL. Probable intramural fibroid along the right lateral margin of the uterus measuring 1.6 x 0.8 x 1.3 cm. No other uterine findings. Endometrium Thickness: 9 mm. Endometrium is mildly heterogeneous, with small amount of fluid in the endometrial cavity and increased vascularity on color Doppler imaging. Findings could reflect retained products of conception. No endometrial masses. Right ovary Measurements: 3.6 x 2.1 x 1.5 cm = volume: 5.9 mL. Normal appearance/no adnexal mass. Left ovary Measurements: 2.7 x 2.6 x 2.6 cm = volume: 9.5 mL. Normal appearance/no adnexal mass. Other findings:  No free fluid or pelvic mass. IMPRESSION: 1. Heterogeneous appearance of the endometrium with a small amount of fluid and increased vascularity which could reflect retained products of conception after medical abortion. 2. Small uterine fibroid. Electronically Signed   By: Ozell Daring M.D.   On: 10/31/2023 20:42      Assessment and Plan:     1. Vaginal discharge Self-swab done, will follow up results and manage accordingly. - Cervicovaginal ancillary only  2. Urinary tract infection symptoms - POCT Urinalysis Dipstick negative.  3. Abnormal uterine bleeding (AUB) Aygestin  was prescribed as requested. Emphasized that this was not technically contraception.   She will follow up with her OB/GYN concerns. - norethindrone  (AYGESTIN ) 5 MG tablet; Take 1 tablet (5 mg total) by mouth daily.  Dispense: 30 tablet; Refill: 3  4. Follow-up visit after miscarriage (Primary) Patient was adamant about wanting a serum pregnancy test even though she had a negative UPT.  She was told this was usually not done, and she wants upset and demanded this test I know my body; I have elevated HCG even with negative urine tests. This was ordered, will follow up results and manage accordingly. - POCT urine pregnancy negative - Beta hCG quant (ref  lab)  Patient was very verbal about her displeasure with my care and came out to the  nurses desk and loudly said she did not want to seen by me any more.  I did let her know she can follow up with her OB/GYN provider at [facility].  Of note, during our encounter, she was combative when I asked about her reasons for not wanting a LEEP for her CIN 2, she said she was worried about infertility and I was trying to convince her of the risk of CIN 2 progression.  She also wanted me to delete the trichomonal vaginitis entry from her medical history, I refused to do so and this made her more upset.  I did discuss this encounter and the patient's desires with the clinic manager, who was on site today at this [facility] location.   Return for with her OB/GYN provider at [facility] for any OB/GYN concerns.    I spent 30 minutes dedicated to the care of this patient including pre-visit review of records, face to face time with the patient discussing her conditions and treatments and post  visit orders.    GLORIS HUGGER, MD, FACOG Obstetrician & Gynecologist, [facility] for [employer], [facility]

## 2023-11-27 LAB — CERVICOVAGINAL ANCILLARY ONLY
Bacterial Vaginitis (gardnerella): NEGATIVE
Candida Glabrata: NEGATIVE
Candida Vaginitis: NEGATIVE
Chlamydia: NEGATIVE
Comment: NEGATIVE
Comment: NEGATIVE
Comment: NEGATIVE
Comment: NEGATIVE
Comment: NEGATIVE
Comment: NORMAL
Neisseria Gonorrhea: NEGATIVE
Trichomonas: NEGATIVE

## 2023-11-27 LAB — BETA HCG QUANT (REF LAB): hCG Quant: <1 m[IU]/mL

## 2023-12-24 ENCOUNTER — Ambulatory Visit (INDEPENDENT_AMBULATORY_CARE_PROVIDER_SITE_OTHER): Payer: Medicaid Other | Admitting: Psychiatry

## 2023-12-24 ENCOUNTER — Encounter: Payer: Self-pay | Admitting: Psychiatry

## 2023-12-24 VITALS — BP 123/80 | HR 98 | Temp 97.7°F | Ht 67.0 in | Wt 233.4 lb

## 2023-12-24 DIAGNOSIS — Z9189 Other specified personal risk factors, not elsewhere classified: Secondary | ICD-10-CM

## 2023-12-24 DIAGNOSIS — F3181 Bipolar II disorder: Secondary | ICD-10-CM | POA: Diagnosis not present

## 2023-12-24 DIAGNOSIS — G4701 Insomnia due to medical condition: Secondary | ICD-10-CM

## 2023-12-24 MED ORDER — QUETIAPINE FUMARATE 25 MG PO TABS
25.0000 mg | ORAL_TABLET | Freq: Every day | ORAL | 1 refills | Status: DC
Start: 2023-12-24 — End: 2024-01-16

## 2023-12-24 NOTE — Progress Notes (Signed)
 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  The patient with bipolar disorder type 2  presents with worsening depression and focus issues.  She has experienced worsening depression and focus issues since a miscarriage in December. She has difficulty staying focused, especially while working from home, and her attention span is significantly affected. Her mood symptoms have worsened, feeling 'always on edge' and 'snappy'. She feels irritable and has not been in a good mood for the past month. She attributes some of these feelings to the miscarriage and the stress of a recent breakup with her boyfriend, which occurred three weeks before she found out she was pregnant.  The miscarriage was medically managed with Cytotec  after it was determined that the pregnancy was not viable. She required a second round of Cytotec  due to incomplete passage. She feels 'okay' with the outcome, acknowledging that she is not in the best mental space to be pregnant. This event has significantly impacted her mental health, contributing to her current depressive symptoms.  She reports sleep disturbances, including trouble falling asleep and staying asleep. She has been using hydroxyzine  to aid sleep but still wakes up during the night. She also mentions increased nighttime eating, which she attributes to residual effects from the pregnancy.  She is currently taking Lamictal  and Lexapro  for her bipolar disorder and depression. She recently ran out of Lexapro , which may have contributed to  her current symptoms. She experiences 'brain zaps' when off Lexapro , which she associates with withdrawal symptoms.  She recently started [facility] for weight management following a gastric sleeve surgery in October 2023. She is concerned about potential side effects of Wegovy, including depression and anxiety, which she has experienced with other weight management medications in the past.  She has a history of iron deficiency anemia, previously attributed to frequent periods, but she no longer requires a separate iron supplement. She is currently on norethindrone  for birth control and has a UTI for which she was recently prescribed antibiotic ciprofloxacin  which she has not started yet..  She currently denies any suicidality, homicidality or perceptual disturbances.  Patient reports she does have good social support system from her brother as well as couple of friends.  Visit Diagnosis:    ICD-10-CM   1. Bipolar II disorder, moderate, depressed, with anxious distress (HCC)  F31.81 QUEtiapine  (SEROQUEL ) 25 MG tablet    2. Insomnia due to medical condition  G47.01 QUEtiapine  (SEROQUEL ) 25 MG tablet   Bipolar disorder, UTI, situational anxiety    3. At risk for prolonged QT interval syndrome  Z91.89 EKG 12-Lead      Past Psychiatric History: I have reviewed past psychiatric history from progress note on 09/19/2022.  Past trials of Wellbutrin-anger, BuSpar-noncompliant  Past Medical History:  Past Medical History:  Diagnosis Date   Anemia    Asthma    no hospitalizations, no recent attacks (since childhood)   Cervical dysplasia    Complication of anesthesia    one sided epidural   Dysplasia of cervix, high grade CIN 2 07/26/2023   Seen on colposcopy after  ASCUS +HRHPV pap   Genital herpes 06/03/2018   History of gestational hypertension    Hypertension, benign essential, goal below 140/90 01/25/2022   Low grade squamous intraepithelial lesion (LGSIL) at risk for high grade squamous  intraepithelial lesion (HGSIL) on cytologic smear of cervix 11/10/2020   Given concern about high grade lesion, colposcopy recommended. Of note, also had LGSIL pap in 2018 ([facility] results), but had normal pap in 2019.    Ovarian cyst    Trichomonal vaginitis 04/20/2020   Late may 2021   UTI (urinary tract infection)     Past Surgical History:  Procedure Laterality Date   CESAREAN SECTION N/A 06/28/2018   Procedure: CESAREAN SECTION;  Surgeon: Barbra Lang PARAS, DO;  Location: [facility];  Service: Obstetrics;  Laterality: N/A;   THERAPEUTIC ABORTION      Family Psychiatric History: I have reviewed family psychiatric history from progress note on 09/19/2022.  Family History:  Family History  Problem Relation Age of Onset   Alcohol abuse Mother    Depression Mother    Hypertension Mother    Hypertension Father    Drug abuse Brother    Bipolar disorder Brother    Diabetes Paternal Grandfather     Social History: I have reviewed social history from progress note on 09/19/2022. Social History   Socioeconomic History   Marital status: Single    Spouse name: Not on file   Number of children: 2   Years of education: Not on file   Highest education level: Bachelor's degree (e.g., BA, AB, BS)  Occupational History   Not on file  Tobacco Use   Smoking status: Never   Smokeless tobacco: Never  Vaping Use   Vaping status: Never Used  Substance and Sexual Activity   Alcohol use: Yes    Comment: every 2-3 months   Drug use: No   Sexual activity: Yes    Birth control/protection: Condom  Other Topics Concern   Not on file  Social History Narrative   Not on file   Social Drivers of Health   Financial Resource Strain: Patient Declined (05/08/2023)   Received from [facility]   Overall Financial Resource Strain (CARDIA)    Difficulty of Paying Living Expenses: Patient declined  Food Insecurity: Patient Declined (05/08/2023)   Received from [facility]   Hunger Vital Sign    Worried About Running Out of Food in the Last Year: Patient declined    Ran Out of Food in the Last Year: Patient declined  Transportation Needs: Patient Declined (05/08/2023)   Received from [facility]   PRAPARE - Transportation    In the past 12 months, has lack of transportation kept you from medical appointments or from getting medications?: Patient declined    Lack of Transportation (Non-Medical): Patient declined  Physical Activity: Not on file  Stress: Not on file  Social Connections: Not on file    Allergies:  Allergies  Allergen Reactions   Penicillins Other (See Comments)    Unknown reaction-childhood allergy  Has patient had a PCN reaction causing immediate rash, facial/tongue/throat swelling, SOB or lightheadedness with hypotension: Unknown Has patient had a PCN reaction causing severe rash involving mucus membranes or skin necrosis: Unknown Has patient had a PCN reaction that required hospitalization: Unknown Has patient had a PCN reaction occurring within the last 10 years: Unknown If all of the above answers are NO, then may proceed with Cephalosporin use.  Metabolic Disorder Labs: Lab Results  Component Value Date   HGBA1C 5.2 06/19/2021   No results found for: PROLACTIN No results found for: CHOL, TRIG, HDL, CHOLHDL, VLDL, LDLCALC Lab Results  Component Value Date   TSH 0.734 06/19/2021    Therapeutic Level Labs: No results found for: LITHIUM No results found for: VALPROATE No results found for: CBMZ  Current Medications: Current Outpatient Medications  Medication Sig Dispense Refill   Albuterol  Sulfate, sensor, 108 (90 Base) MCG/ACT AEPB Inhale into the lungs.     benzoyl peroxide -erythromycin  (BENZAMYCIN) gel Apply topically 2 (two) times daily. Can bleach fabric; allow to dry completely. Cut back to once daily if too drying on skin 23.3 g 0   calcium  carbonate (TUMS EX) 750 MG chewable tablet Chew by mouth.     calcium citrate (CALCITRATE - DOSED IN MG ELEMENTAL CALCIUM) 950 (200 Ca) MG tablet Take 200 mg of elemental calcium by mouth daily.     cetirizine (ZYRTEC) 10 MG tablet Take by mouth.     ciprofloxacin  (CIPRO ) 250 MG tablet Take by mouth 2 (two) times daily at 10 AM and 5 PM.     escitalopram  (LEXAPRO ) 20 MG tablet TAKE 1 TABLET BY MOUTH EVERY DAY 90 tablet 0   ferrous sulfate  325 (65 FE) MG tablet Take 1 tablet by mouth daily with breakfast.     fluticasone (FLONASE) 50 MCG/ACT nasal spray Place into the nose.     hydrOXYzine  (ATARAX ) 10 MG tablet Take 1 tablet (10 mg total) by mouth 3 (three) times daily as needed for anxiety. 90 tablet 1   lamoTRIgine  (LAMICTAL ) 25 MG tablet Take 1 tablet (25 mg total) by mouth 2 (two) times daily. 180 tablet 0   Multiple Vitamins-Iron (MULTIVITAMINS WITH IRON) TABS tablet Take 1 tablet by mouth daily.     norethindrone  (AYGESTIN ) 5 MG tablet Take 1 tablet (5 mg total) by mouth daily. 30 tablet 3   omeprazole (PRILOSEC) 20 MG capsule TAKE 1 CAPSULE EVERY DAY FOR ACID REFLUX AS NEEDED     ondansetron  (ZOFRAN -ODT) 8 MG disintegrating tablet Take by mouth.     QUEtiapine  (SEROQUEL ) 25 MG tablet Take 1 tablet (25 mg total) by mouth at bedtime. 30 tablet 1   Semaglutide-Weight Management (WEGOVY) 0.25 MG/0.5ML SOAJ Inject into the skin once a week.     SUMAtriptan (IMITREX) 50 MG tablet      No current facility-administered medications for this visit.     Musculoskeletal: Strength & Muscle Tone: within normal limits Gait & Station: normal Patient leans: N/A  Psychiatric Specialty Exam: Review of Systems  Psychiatric/Behavioral:  Positive for decreased concentration, dysphoric mood and sleep disturbance. The patient is nervous/anxious.     Blood pressure 123/80, pulse 98, temperature 97.7 F (36.5 C), temperature source Skin, height 5' 7 (1.702 m), weight 233 lb 6.4 oz (105.9 kg), last  menstrual period 11/26/2023, not currently breastfeeding.Body mass index is 36.56 kg/m.  General Appearance: Casual  Eye Contact:  Fair  Speech:  Clear and Coherent  Volume:  Normal  Mood:  Anxious and Depressed  Affect:  Congruent  Thought Process:  Goal Directed and Descriptions of Associations: Intact  Orientation:  Full (Time, Place, and Person)  Thought Content: Logical   Suicidal Thoughts:  No  Homicidal Thoughts:  No  Memory:  Immediate;   Fair Recent;   Fair Remote;   Fair  Judgement:  Fair  Insight:  Fair  Psychomotor Activity:  Normal  Concentration:  Concentration:  Fair and Attention Span: Fair  Recall:  [employer] of Knowledge: Fair  Language: Fair  Akathisia:  No  Handed:  Right  AIMS (if indicated): not done  Assets:  Desire for Improvement Housing Social Support  ADL's:  Intact  Cognition: WNL  Sleep:  Poor   Screenings: GAD-7    Loss Adjuster, Chartered Office Visit from 12/24/2023 in [facility] Office Visit from 09/19/2022 in [facility]  Total GAD-7 Score 19 13      PHQ2-9    Flowsheet Row Office Visit from 12/24/2023 in [facility] Office Visit from 01/08/2023 in [facility] [place] Video Visit from 11/01/2022 in [facility] Office Visit from 09/19/2022 in [facility]  PHQ-2 Total Score 4 4 0 2  PHQ-9 Total Score 16 -- -- 9      Flowsheet Row Office Visit from 12/24/2023 in [facility] Video Visit from 08/13/2023 in [facility] ED from 06/22/2023 in [facility]   C-SSRS RISK CATEGORY No Risk No Risk No Risk        Assessment and Plan: BREYAH AKHTER is a 28 year old African-American female who is single,  currently employed, has a history of bipolar disorder, insomnia, migraine headaches, currently struggling with worsening anxiety, depression sleep problems, discussed assessment and plan as noted below.   Bipolar Disorder Type II depressed with anxious distress moderate-unstable Worsening mood symptoms, including irritability, anxiety, and difficulty focusing, particularly after a miscarriage in December. Current medications (Lamictal  and Lexapro ) are ineffective. Significant distress affecting personal and professional life. Discussed starting Seroquel  to address mood symptoms and improve sleep. Explained potential side effects, including weight gain and cardiac issues, and the need for an EKG. Consider increasing Lamictal  dosage if Seroquel  is not effective. - Start Seroquel  25 mg at bedtime. - Order EKG to monitor for potential cardiac side effects. - Continue Lamictal  25 mg twice daily - Continue Lexapro  20 mg daily.  Encouraged compliance. - Schedule follow-up virtual visit in two weeks to assess response to Seroquel  and consider increasing Lamictal  dosage.   Insomnia-unstable Difficulty falling and staying asleep, requiring hydroxyzine . Insomnia contributing to mood instability and difficulty focusing. Discussed starting Seroquel  to improve sleep quality. - Start Seroquel  25 mg at bedtime at bedtime. - Start Hydroxyzine  10 mg at bedtime as needed - Patient encouraged to establish care with therapist reports she has upcoming appointment with [facility].   At risk for prolonged QT syndrome-order EKG, patient to call [phone].  Provided medication education including pregnancy implications/implications during reproductive age group, tardive dyskinesia, cardiac effect, metabolic syndrome, weight gain.   Follow-up - Schedule virtual follow-up visit on February 17th at 3:20 PM. - Complete EKG and notify provider once done. - Ensure completion of intake forms for [facility]  and begin therapy.  Collaboration of Care: Collaboration of Care: Referral or follow-up with counselor/therapist AEB patient encouraged to establish care with therapist.  Patient/Guardian was advised Release of Information must be obtained prior to any record release in order to collaborate their care with an outside provider. Patient/Guardian was advised if they have not already done so to contact the registration department to sign all necessary forms in order for us  to release information regarding their care.   Consent: Patient/Guardian gives verbal consent  for treatment and assignment of benefits for services provided during this visit. Patient/Guardian expressed understanding and agreed to proceed.   This note was generated in part or whole with voice recognition software. Voice recognition is usually quite accurate but there are transcription errors that can and very often do occur. I apologize for any typographical errors that were not detected and corrected.    Kayti Poss, MD 12/26/2023, 2:54 PM

## 2023-12-24 NOTE — Patient Instructions (Addendum)
Please call for EKG - [phone]  Quetiapine Tablets What is this medication? QUETIAPINE (kwe TYE a peen) treats schizophrenia and bipolar disorder. It works by balancing the levels of dopamine and serotonin in your brain, hormones that help regulate mood, behaviors, and thoughts. It belongs to a group of medications called antipsychotics. Antipsychotic medications can be used to treat several kinds of mental health conditions. This medicine may be used for other purposes; ask your health care provider or pharmacist if you have questions. COMMON BRAND NAME(S): Seroquel What should I tell my care team before I take this medication? They need to know if you have any of these conditions: Blockage in your bowels Cataracts Constipation Dementia Diabetes Difficulty swallowing Glaucoma Heart disease High levels of prolactin History of breast cancer History of irregular heartbeat Liver disease Low blood cell levels (white cells, red cells, and platelets) Low blood pressure Parkinson disease Prostate disease Seizures Suicidal thoughts, plans, or attempt by you or a family member Thyroid disease Trouble passing urine An unusual or allergic reaction to quetiapine, other medications, foods, dyes, or preservatives Pregnant or trying to get pregnant Breastfeeding How should I use this medication? Take this medication by mouth with water. Take it as directed on the prescription label at the same time every day. You can take it with or without food. If it upsets your stomach, take it with food. Keep taking it unless your care team tells you to stop. A special MedGuide will be given to you by the pharmacist with each prescription and refill. Be sure to read this information carefully each time. Talk to your care team about the use of this medication in children. While this medication may be prescribed for children as young as 10 years for selected conditions, precautions do apply. People over  77 years of age may have a stronger reaction to this medication and need smaller doses. Overdosage: If you think you have taken too much of this medicine contact a poison control center or emergency room at once. NOTE: This medicine is only for you. Do not share this medicine with others. What if I miss a dose? If you miss a dose, take it as soon as you can. If it is almost time for your next dose, take only that dose. Do not take double or extra doses. What may interact with this medication? Do not take this medication with any of the following: Cisapride Dronedarone Metoclopramide Pimozide Thioridazine This medication may also interact with the following: Alcohol Antihistamines for allergy, cough, and cold Atropine Avasimibe Certain antivirals for HIV or hepatitis Certain medications for anxiety or sleep Certain medications for bladder problems, such as oxybutynin, tolterodine Certain medications for depression, such as amitriptyline, fluoxetine, nefazodone, sertraline Certain medications for fungal infections, such as fluconazole, ketoconazole, itraconazole, posaconazole Certain medications for stomach problems, such as dicyclomine, hyoscyamine Certain medications for travel sickness, such as scopolamine Cimetidine General anesthetics, such as halothane, isoflurane, methoxyflurane, propofol Ipratropium Levodopa or other medications for Parkinson disease Medications for blood pressure Medications for seizures Medications that relax muscles for surgery Opioid medications for pain Other medications that cause heart rhythm changes Phenothiazines, such as chlorpromazine, prochlorperazine Rifampin St. John's wort This list may not describe all possible interactions. Give your health care provider a list of all the medicines, herbs, non-prescription drugs, or dietary supplements you use. Also tell them if you smoke, drink alcohol, or use illegal drugs. Some items may interact with  your medicine. What should I watch for while using  this medication? Visit your care team for regular checks on your progress. Tell your care team if your symptoms do not start to get better or if they get worse. Do not suddenly stop taking This medication. You may develop a severe reaction. Your care team will tell you how much medication to take. If your care team wants you to stop the medication, the dose may be slowly lowered over time to avoid any side effects. You may need to have an eye exam before and during use of this medication. This medication may increase blood sugar. Ask your care team if changes in diet or medications are needed if you have diabetes. This medication may cause thoughts of suicide or depression. This includes sudden changes in mood, behaviors, or thoughts. These changes can happen at any time but are more common in the beginning of treatment or after a change in dose. Call your care team right away if you experience these thoughts or worsening depression. This medication may affect your coordination, reaction time, or judgment. Do not drive or operate machinery until you know how this medication affects you. Sit up or stand slowly to reduce the risk of dizzy or fainting spells. Drinking alcohol with this medication can increase the risk of these side effects. This medication can cause problems with controlling your body temperature. It can lower the response of your body to cold temperatures. If possible, stay indoors during cold weather. If you must go outdoors, wear warm clothes. It can also lower the response of your body to heat. Do not overheat. Do not over-exercise. Stay out of the sun when possible. If you must be in the sun, wear cool clothing. Drink plenty of water. If you have trouble controlling your body temperature, call your care team right away. What side effects may I notice from receiving this medication? Side effects that you should report to your care team as  soon as possible: Allergic reactions--skin rash, itching, hives, swelling of the face, lips, tongue, or throat Heart rhythm changes--fast or irregular heartbeat, dizziness, feeling faint or lightheaded, chest pain, trouble breathing High blood sugar (hyperglycemia)--increased thirst or amount of urine, unusual weakness or fatigue, blurry vision High fever, stiff muscles, increased sweating, fast or irregular heartbeat, and confusion, which may be signs of neuroleptic malignant syndrome High prolactin level--unexpected breast tissue growth, discharge from the nipple, change in sex drive or performance, irregular menstrual cycle Increase in blood pressure in children Infection--fever, chills, cough, or sore throat Low blood pressure--dizziness, feeling faint or lightheaded, blurry vision Low thyroid levels (hypothyroidism)--unusual weakness or fatigue, increased sensitivity to cold, constipation, hair loss, dry skin, weight gain, feelings of depression Pain or trouble swallowing Seizures Stroke--sudden numbness or weakness of the face, arm, or leg, trouble speaking, confusion, trouble walking, loss of balance or coordination, dizziness, severe headache, change in vision Sudden eye pain or change in vision such as blurry vision, seeing halos around lights, vision loss Thoughts of suicide or self-harm, worsening mood, feelings of depression Trouble passing urine Uncontrolled and repetitive body movements, muscle stiffness or spasms, tremors or shaking, loss of balance or coordination, restlessness, shuffling walk, which may be signs of extrapyramidal symptoms (EPS) Side effects that usually do not require medical attention (report to your care team if they continue or are bothersome): Constipation Dizziness Drowsiness Dry mouth Weight gain This list may not describe all possible side effects. Call your doctor for medical advice about side effects. You may report side effects to FDA at  1-800-FDA-1088.  Where should I keep my medication? Keep out of the reach of children. Store at room temperature between 15 and 30 degrees C (59 and 86 degrees F). Throw away any unused medication after the expiration date. NOTE: This sheet is a summary. It may not cover all possible information. If you have questions about this medicine, talk to your doctor, pharmacist, or health care provider.  2024 Elsevier/Gold Standard (2022-05-21 00:00:00)

## 2023-12-26 ENCOUNTER — Ambulatory Visit
Admission: RE | Admit: 2023-12-26 | Discharge: 2023-12-26 | Disposition: A | Discharge status: Home / Self Care | Payer: Medicaid Other | Source: Ambulatory Visit | Attending: Psychiatry | Admitting: Psychiatry

## 2023-12-26 DIAGNOSIS — Z9189 Other specified personal risk factors, not elsewhere classified: Secondary | ICD-10-CM | POA: Insufficient documentation

## 2024-01-06 ENCOUNTER — Encounter: Payer: Self-pay | Admitting: Psychiatry

## 2024-01-06 ENCOUNTER — Telehealth (INDEPENDENT_AMBULATORY_CARE_PROVIDER_SITE_OTHER): Payer: Self-pay | Admitting: Psychiatry

## 2024-01-06 DIAGNOSIS — G4701 Insomnia due to medical condition: Secondary | ICD-10-CM

## 2024-01-06 DIAGNOSIS — R41840 Attention and concentration deficit: Secondary | ICD-10-CM | POA: Diagnosis not present

## 2024-01-06 DIAGNOSIS — F3181 Bipolar II disorder: Secondary | ICD-10-CM

## 2024-01-06 MED ORDER — LAMOTRIGINE 25 MG PO TABS: 75.0000 mg | ORAL_TABLET | ORAL | 0 refills | Status: DC

## 2024-01-06 NOTE — Progress Notes (Unsigned)
 Virtual Visit via Video Note  I connected with Tiffany Sanchez on 01/06/24 at  3:20 PM EST by a video enabled telemedicine application and verified that I am speaking with the correct person using two identifiers.  Location Provider Location : [facility] Patient Location : Home  Participants: Patient , Provider    I discussed the limitations of evaluation and management by telemedicine and the availability of in person appointments. The patient expressed understanding and agreed to proceed.  I discussed the assessment and treatment plan with the patient. The patient was provided an opportunity to ask questions and all were answered. The patient agreed with the plan and demonstrated an understanding of the instructions.   The patient was advised to call back or seek an in-person evaluation if the symptoms worsen or if the condition fails to improve as anticipated.  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  Patient recently started on Seroquel for bipolar disorder, currently reports she is compliant on the medication.She is experiencing increased appetite since starting Seroquel at night, although no weight gain has been noted yet. She describes feeling 'always hungry' while on this medication. Her mood and depression have shown some improvement, and she is experiencing better sleep since starting the medication.  She is concerned about her focus and memory, stating she is 'forgetting a lot of stuff all the time.' There was a previous  discussion about the possibility of an ADHD assessment.  She recently started therapy at [facility] and has attended her first session, with plans to continue weekly sessions. She mentions missing work due to her son's illness but plans to return to work the following Monday. She works from home and is preparing for an upcoming snowstorm, which may affect her work schedule.  She continues to be compliant on the Lamictal.  She is interested in increasing the dosage to target her mood symptoms better.  Denies any side effects.  Patient did not express any suicidality, homicidality or perceptual disturbances.   Visit Diagnosis:    ICD-10-CM   1. Bipolar II disorder, mild, depressed, with anxious distress (HCC)  F31.81 lamoTRIgine (LAMICTAL) 25 MG tablet    2. Insomnia due to medical condition  G47.01    Bipolar disorder    3. Attention and concentration deficit  R41.840       Past Psychiatric History: I have reviewed past psychiatric history from progress note on 09/19/2022.  Past trials of Wellbutrin-anger, BuSpar-noncompliant.  Past Medical History:  Past Medical History:  Diagnosis Date   Anemia    Asthma    no hospitalizations, no recent attacks (since childhood)   Cervical dysplasia    Complication of anesthesia    one sided epidural   Dysplasia of cervix, high grade CIN 2 07/26/2023   Seen on colposcopy after ASCUS +HRHPV pap   Genital herpes 06/03/2018   History of gestational hypertension    Hypertension, benign essential, goal below 140/90 01/25/2022   Low grade squamous intraepithelial lesion (LGSIL) at risk for high grade squamous intraepithelial lesion (HGSIL) on cytologic smear  of cervix 11/10/2020   Given concern about high grade lesion, colposcopy recommended. Of note, also had LGSIL pap in 2018 ([facility] results), but had normal pap in 2019.    Ovarian cyst    Trichomonal vaginitis 04/20/2020   Late may 2021   UTI (urinary tract infection)     Past Surgical  History:  Procedure Laterality Date   CESAREAN SECTION N/A 06/28/2018   Procedure: CESAREAN SECTION;  Surgeon: Levie Heritage, DO;  [facility];  Service: Obstetrics;  Laterality: N/A;   THERAPEUTIC ABORTION      Family Psychiatric History: I have reviewed family psychiatric history from progress note on 09/19/2022.  Family History:  Family History  Problem Relation Age of Onset   Alcohol abuse Mother    Depression Mother    Hypertension Mother    Hypertension Father    Drug abuse Brother    Bipolar disorder Brother    Diabetes Paternal Grandfather     Social History: I have reviewed social history from progress note on 09/19/2022. Social History   Socioeconomic History   Marital status: Single    Spouse name: Not on file   Number of children: 2   Years of education: Not on file   Highest education level: Bachelor's degree (e.g., BA, AB, BS)  Occupational History   Not on file  Tobacco Use   Smoking status: Never   Smokeless tobacco: Never  Vaping Use   Vaping status: Never Used  Substance and Sexual Activity   Alcohol use: Yes    Comment: every 2-3 months   Drug use: No   Sexual activity: Yes    Birth control/protection: Condom  Other Topics Concern   Not on file  Social History Narrative   Not on file   Social Drivers of Health   Financial Resource Strain: Patient Declined (05/08/2023)   [facility]   Overall Financial Resource Strain (CARDIA)    Difficulty of Paying Living Expenses: Patient declined  Food Insecurity: Patient Declined (05/08/2023)   Received from [facility]   Hunger Vital Sign    Worried About Running Out of Food in the Last Year: Patient declined    Ran Out of Food in the Last Year: Patient declined  Transportation Needs: Patient Declined (05/08/2023)   Received from [facility]   PRAPARE - Transportation    In the past 12 months, has lack of  transportation kept you from medical appointments or from getting medications?: Patient declined    Lack of Transportation (Non-Medical): Patient declined  Physical Activity: Not on file  Stress: Not on file  Social Connections: Not on file    Allergies:  Allergies  Allergen Reactions   Penicillins Other (See Comments)    Unknown reaction-childhood allergy  Has patient had a PCN reaction causing immediate rash, facial/tongue/throat swelling, SOB or lightheadedness with hypotension: Unknown Has patient had a PCN reaction causing severe rash involving mucus membranes or skin necrosis: Unknown Has patient had a PCN reaction that required hospitalization: Unknown Has patient had a PCN reaction occurring within the last 10 years: Unknown If all of the above answers are "NO", then may proceed with Cephalosporin use.     Metabolic Disorder Labs: Lab Results  Component Value Date   HGBA1C 5.2 06/19/2021   No results found for: "PROLACTIN" No results found for: "CHOL", "TRIG", "HDL", "CHOLHDL", "VLDL", "LDLCALC" Lab Results  Component Value Date   TSH 0.734 06/19/2021  Therapeutic Level Labs: No results found for: "LITHIUM" No results found for: "VALPROATE" No results found for: "CBMZ"  Current Medications: Current Outpatient Medications  Medication Sig Dispense Refill   Albuterol Sulfate, sensor, 108 (90 Base) MCG/ACT AEPB Inhale into the lungs.     benzoyl peroxide-erythromycin (BENZAMYCIN) gel Apply topically 2 (two) times daily. Can bleach fabric; allow to dry completely. Cut back to once daily if too drying on skin 23.3 g 0   calcium carbonate (TUMS EX) 750 MG chewable tablet Chew by mouth.     calcium citrate (CALCITRATE - DOSED IN MG ELEMENTAL CALCIUM) 950 (200 Ca) MG tablet Take 200 mg of elemental calcium by mouth daily.     cetirizine (ZYRTEC) 10 MG tablet Take by mouth.     escitalopram (LEXAPRO) 20 MG tablet TAKE 1 TABLET BY MOUTH EVERY DAY 90 tablet 0   ferrous  sulfate 325 (65 FE) MG tablet Take 1 tablet by mouth daily with breakfast.     fluticasone (FLONASE) 50 MCG/ACT nasal spray Place into the nose.     hydrOXYzine (ATARAX) 10 MG tablet Take 1 tablet (10 mg total) by mouth 3 (three) times daily as needed for anxiety. 90 tablet 1   lamoTRIgine (LAMICTAL) 25 MG tablet Take 3 tablets (75 mg total) by mouth as directed. Take 1 tablet daily morning and 2 tablets daily in the evening 90 tablet 0   Multiple Vitamins-Iron (MULTIVITAMINS WITH IRON) TABS tablet Take 1 tablet by mouth daily.     norethindrone (AYGESTIN) 5 MG tablet Take 1 tablet (5 mg total) by mouth daily. 30 tablet 3   omeprazole (PRILOSEC) 20 MG capsule TAKE 1 CAPSULE EVERY DAY FOR ACID REFLUX AS NEEDED     ondansetron (ZOFRAN-ODT) 8 MG disintegrating tablet Take by mouth.     QUEtiapine (SEROQUEL) 25 MG tablet Take 1 tablet (25 mg total) by mouth at bedtime. 30 tablet 1   Semaglutide-Weight Management (WEGOVY) 0.25 MG/0.5ML SOAJ Inject into the skin once a week.     SUMAtriptan (IMITREX) 50 MG tablet      No current facility-administered medications for this visit.     Musculoskeletal: Strength & Muscle Tone:  UTA Gait & Station:  Seated Patient leans: N/A  Psychiatric Specialty Exam: Review of Systems  Psychiatric/Behavioral:  The patient is nervous/anxious.        Mood swings    Last menstrual period 11/26/2023.There is no height or weight on file to calculate BMI.  General Appearance: Casual  Eye Contact:  Good  Speech:  Clear and Coherent  Volume:  Normal  Mood:  Anxious and mood swings -improving  Affect:  Appropriate  Thought Process:  Goal Directed and Descriptions of Associations: Intact  Orientation:  Full (Time, Place, and Person)  Thought Content: Logical   Suicidal Thoughts:  No  Homicidal Thoughts:  No  Memory:  Immediate;   Fair Recent;   Fair Remote;   Fair  Judgement:  Fair  [facility]:  Good  Psychomotor Activity:  Normal  Concentration:   Concentration: Fair and Attention Span: Fair  Recall:  [employer] of Knowledge: Fair  Language: Fair  Akathisia:  No  Handed:  Right  AIMS (if indicated): not done  Assets:  Communication Skills Desire for Improvement Housing Social Support Talents/Skills  ADL's:  Intact  Cognition: WNL  Sleep:   Improving   Screenings: GAD-7    Flowsheet Row Office Visit from 12/24/2023 in [facility] Office Visit from 09/19/2022  in [facility]  Total GAD-7 Score 19 13      PHQ2-9    Flowsheet Row Office Visit from 12/24/2023 in [facility] Office Visit from 01/08/2023 in [facility] [place] Video Visit from 11/01/2022 in [facility] Office Visit from 09/19/2022 in [facility]  PHQ-2 Total Score 4 4 0 2  PHQ-9 Total Score 16 -- -- 9      Flowsheet Row Video Visit from 01/06/2024 in [facility] Office Visit from 12/24/2023 in [facility] Video Visit from 08/13/2023 in [facility]  C-SSRS RISK CATEGORY No Risk No Risk No Risk        Assessment and Plan: Tiffany Sanchez is a 28 year old African-American female who is single, currently employed, recently initiated on Seroquel for her mood symptoms and sleep, discussed assessment and plan as noted below.  Bipolar Disorder type II depressed with anxious distress-improving Reports improvement in mood and sleep with Seroquel. Experiences increased appetite, a known side effect, and is concerned about potential weight gain. Willing to increase Lamictal to 75 mg daily to further stabilize mood. Informed about mindful eating habits and low-calorie snacks to mitigate weight gain. Discussed  benefits of Seroquel for mood stabilization and sleep improvement, and potential side effects including weight gain, which is not dose-dependent. - Increase Lamictal to 75 mg daily (one 25 mg tablet in the morning and two 25 mg tablets in the evening) - Continue Seroquel 25 mg at bedtime - Continue Lexapro 20 mg daily - Encourage mindful eating habits and low-calorie snacks - Continue CBT with [facility].  Insomnia-improving Currently reports sleep is overall good. - Continue Seroquel 25 mg at bedtime. - Continue Hydroxyzine 10 mg 3 times a day as needed  Suspected ADHD Reports ongoing issues with focus and memory, suggesting possible ADHD. Interested in pursuing an ADHD assessment. Explained that mood and sleep should be managed before testing and potential high out-of-pocket costs despite insurance coverage. - Refer to [facility] for ADHD assessment - Advise to discuss potential out-of-pocket costs with the provider - Provide contact information for [facility]  Follow-up - Schedule virtual follow-up appointment on February 06, 2024, at 4:20 PM.  Collaboration of Care: Collaboration of Care: Referral or follow-up with counselor/therapist AEB patient encouraged to continue CBT  Patient/Guardian was advised Release of Information must be obtained prior to any record release in order to collaborate their care with an outside provider. Patient/Guardian was advised if they have not already done so to contact the registration department to sign all necessary forms in order for [place] to release information regarding their care.   Consent: Patient/Guardian gives verbal consent for treatment and assignment of benefits for services provided during this visit. Patient/Guardian expressed understanding and agreed to proceed.   This note was generated in part or whole with voice recognition software. Voice recognition is usually quite accurate but there are transcription errors that  can and very often do occur. I apologize for any typographical errors that were not detected and corrected.    Jomarie Longs, MD 01/07/2024, 9:27 AM

## 2024-01-15 ENCOUNTER — Other Ambulatory Visit: Payer: Self-pay | Admitting: Psychiatry

## 2024-01-15 DIAGNOSIS — F3181 Bipolar II disorder: Secondary | ICD-10-CM

## 2024-01-15 DIAGNOSIS — G4701 Insomnia due to medical condition: Secondary | ICD-10-CM

## 2024-01-28 ENCOUNTER — Other Ambulatory Visit: Payer: Self-pay | Admitting: Psychiatry

## 2024-01-28 DIAGNOSIS — F3181 Bipolar II disorder: Secondary | ICD-10-CM

## 2024-02-02 ENCOUNTER — Ambulatory Visit
Admission: EM | Admit: 2024-02-02 | Discharge: 2024-02-02 | Disposition: A | Discharge status: Home / Self Care | Attending: Emergency Medicine | Admitting: Emergency Medicine

## 2024-02-02 DIAGNOSIS — N898 Other specified noninflammatory disorders of vagina: Secondary | ICD-10-CM | POA: Insufficient documentation

## 2024-02-02 LAB — POCT URINALYSIS DIP (MANUAL ENTRY)
Bilirubin, UA: NEGATIVE
Blood, UA: NEGATIVE
Glucose, UA: NEGATIVE mg/dL
Ketones, POC UA: NEGATIVE mg/dL
Nitrite, UA: NEGATIVE
Spec Grav, UA: 1.02
Urobilinogen, UA: 2 U/dL — AB
pH, UA: 7

## 2024-02-02 MED ORDER — METRONIDAZOLE 500 MG PO TABS: 500.0000 mg | ORAL_TABLET | Freq: Two times a day (BID) | ORAL | 0 refills | Status: DC

## 2024-02-02 MED ORDER — FLUCONAZOLE 150 MG PO TABS: 150.0000 mg | ORAL_TABLET | ORAL | 0 refills | Status: AC

## 2024-02-02 NOTE — ED Provider Notes (Signed)
 Tiffany Sanchez    CSN: [id number] Arrival date & time: 02/02/24  [id number]      History   Chief Complaint Chief Complaint  Patient presents with   Vaginal Discharge    HPI Tiffany Sanchez is a 28 y.o. female.   Patient presents for evaluation of gray vaginal discharge, mild vaginal itching and vaginal odor present for 2 days.  Has begun to experience lower abdominal discomfort as well as increased fatigue.  Has not attempted treatment.  Sexually active, no known exposure.  Recently treated for BV, reoccurring history.  Denies urinary symptoms, flank pain or fever.  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 Of note, also had LGSIL pap in 2018 ([facility] results), but had normal pap in 2019.    Ovarian cyst    Trichomonal vaginitis 04/20/2020   Late may 2021   UTI (urinary tract infection)     Patient Active Problem List   Diagnosis Date Noted   Attention and concentration deficit 01/06/2024   S/P bariatric surgery 05/08/2023   Iron deficiency anemia due to chronic blood loss 05/08/2023   Asthma 09/19/2022   Bipolar II disorder, mild, depressed, with anxious distress (HCC) 09/19/2022   Insomnia 09/19/2022   At risk for prolonged QT interval syndrome 08/20/2022   Abnormal uterine bleeding 06/06/2022   Hypertension, benign essential, goal below 140/90 01/25/2022   GERD  (gastroesophageal reflux disease) 01/25/2022   Menorrhagia with regular cycle 01/10/2022   Dysplasia of cervix, low grade (CIN 1) 12/22/2020   Low grade squamous intraepithelial lesion (LGSIL) at risk for high grade lesion on pap smear 11/10/20 11/10/2020   Family history of breast cancer 10/22/2019   BMI 50.0-59.9, adult (HCC) 06/03/2018   Genital herpes 06/03/2018   History of gestational hypertension 12/20/2017   History of shoulder dystocia in prior pregnancy, currently pregnant 12/20/2017    Past Surgical History:  Procedure Laterality Date   CESAREAN SECTION N/A 06/28/2018   Procedure: CESAREAN SECTION;  Surgeon: Levie Heritage, DO;  Location: [facility];  Service: Obstetrics;  Laterality: N/A;   THERAPEUTIC ABORTION      OB History     Gravida  5   Para  2   Term  2   Preterm      AB  2   Living  2      SAB      IAB  1   Ectopic      Multiple  0   Live Births  2            Home Medications    Prior to Admission medications   Medication Sig Start Date End Date Taking? Authorizing Provider  escitalopram (LEXAPRO) 20 MG tablet TAKE 1 TABLET BY MOUTH EVERY DAY 11/08/23  Yes Eappen, Levin Bacon, MD  fluconazole (DIFLUCAN) 150 MG tablet Take 1 tablet (150 mg total) by mouth once  a week for 2 doses. 02/02/24 02/10/24 Yes Rosaleigh Brazzel, Elita Boone, NP  lamoTRIgine (LAMICTAL) 25 MG tablet Take 3 tablets (75 mg total) by mouth as directed. Take 1 tablet daily morning and 2 tablets daily in the evening 01/06/24 02/05/24 Yes Eappen, Levin Bacon, MD  metroNIDAZOLE (FLAGYL) 500 MG tablet Take 1 tablet (500 mg total) by mouth 2 (two) times daily. 02/02/24  Yes Klayton Monie, Elita Boone, NP  Semaglutide-Weight Management (WEGOVY) 0.25 MG/0.5ML SOAJ Inject into the skin once a week. 12/16/23  Yes [provider]  Albuterol Sulfate, sensor, 108 (90 Base) MCG/ACT AEPB Inhale into the lungs.    [provider]  benzoyl peroxide-erythromycin (BENZAMYCIN) gel Apply topically  2 (two) times daily. Can bleach fabric; allow to dry completely. Cut back to once daily if too drying on skin 06/22/23   Crain, Whitney L, PA  calcium carbonate (TUMS EX) 750 MG chewable tablet Chew by mouth.    [provider]  calcium citrate (CALCITRATE - DOSED IN MG ELEMENTAL CALCIUM) 950 (200 Ca) MG tablet Take 200 mg of elemental calcium by mouth daily.    [provider]  cetirizine (ZYRTEC) 10 MG tablet Take by mouth.    [provider]  ferrous sulfate 325 (65 FE) MG tablet Take 1 tablet by mouth daily with breakfast. 06/12/22   [provider]  fluticasone (FLONASE) 50 MCG/ACT nasal spray Place into the nose.    [provider]  hydrOXYzine (ATARAX) 10 MG tablet Take 1 tablet (10 mg total) by mouth 3 (three) times daily as needed for anxiety. 08/13/23   Jomarie Longs, MD  Multiple Vitamins-Iron (MULTIVITAMINS WITH IRON) TABS tablet Take 1 tablet by mouth daily.    [provider]  norethindrone (AYGESTIN) 5 MG tablet Take 1 tablet (5 mg total) by mouth daily. 11/26/23   Anyanwu, Jethro Bastos, MD  omeprazole (PRILOSEC) 20 MG capsule TAKE 1 CAPSULE EVERY DAY FOR ACID REFLUX AS NEEDED 11/06/21   [provider]  ondansetron (ZOFRAN-ODT) 8 MG disintegrating tablet Take by mouth. 09/18/22   [provider]  QUEtiapine (SEROQUEL) 25 MG tablet TAKE 1 TABLET BY MOUTH EVERYDAY AT BEDTIME 01/16/24   Jomarie Longs, MD  SUMAtriptan (IMITREX) 50 MG tablet  11/06/21   [provider]    Family History Family History  Problem Relation Age of Onset   Alcohol abuse Mother    Depression Mother    Hypertension Mother    Hypertension Father    Drug abuse Brother    Bipolar disorder Brother    Diabetes Paternal Grandfather     Social History Social History   Tobacco Use   Smoking status: Never   Smokeless tobacco: Never  Vaping Use   Vaping status: Never Used  Substance Use Topics   Alcohol use: Yes    Comment: every  2-3 months   Drug use: No     Allergies   Penicillins   Review of Systems Review of Systems  Genitourinary:  Positive for vaginal discharge.     Physical Exam Triage Vital Signs ED Triage Vitals [02/02/24 1000]  Encounter Vitals Group     BP 129/85     Systolic BP Percentile      Diastolic BP Percentile      Pulse Rate 98     Resp 15     Temp 98.7 F (37.1 C)     Temp Source Oral     SpO2 99 %     Weight  Height      Head Circumference      Peak Flow      Pain Score 0     Pain Loc      Pain Education      Exclude from Growth Chart    No data found.  Updated Vital Signs BP 129/85 (BP Location: Left Arm)   Pulse 98   Temp 98.7 F (37.1 C) (Oral)   Resp 15   LMP 08/01/2023 (Approximate)   SpO2 99%   Visual Acuity Right Eye Distance:   Left Eye Distance:   Bilateral Distance:    Right Eye Near:   Left Eye Near:    Bilateral Near:     Physical Exam Constitutional:      Appearance: Normal appearance.  Eyes:     Extraocular Movements: Extraocular movements intact.  Pulmonary:     Effort: Pulmonary effort is normal.  Abdominal:     Tenderness: There is abdominal tenderness in the suprapubic area. There is no right CVA tenderness or left CVA tenderness.  Genitourinary:    Comments: deferred Neurological:     Mental Status: She is alert and oriented to person, place, and time.      UC Treatments / Results  Labs (all labs ordered are listed, but only abnormal results are displayed) Labs Reviewed  POCT URINALYSIS DIP (MANUAL ENTRY) - Abnormal; Notable for the following components:      Result Value   Protein Ur, POC trace (*)    Urobilinogen, UA 2.0 (*)    Leukocytes, UA Small (1+) (*)    All other components within normal limits  CERVICOVAGINAL ANCILLARY ONLY    EKG   Radiology No results found.  Procedures Procedures (including critical care time)  Medications Ordered in UC Medications - No data to display  Initial  Impression / Assessment and Plan / UC Course  I have reviewed the triage vital signs and the nursing notes.  Pertinent labs & imaging results that were available during my care of the patient were reviewed by me and considered in my medical decision making (see chart for details).  Vaginal discharge, vaginal odor  Treating prophylactically for BV and yeast, prescribed metronidazole and Diflucan and discussed administration, advised abstaining from alcohol during treatment,  STI labs pending will treat per protocol, advised abstinence until lab results, and/or treatment is complete, advised condom use during all sexual encounters moving, may follow-up with urgent care as needed  Final Clinical Impressions(s) / UC Diagnoses   Final diagnoses:  Vaginal discharge  Vaginal odor     Discharge Instructions      Today you are being treated prophylactically for  Bacterial vaginosis yeast  Take Metronidazole 500 mg twice a day for 7 days, do not drink alcohol while using medication, this will make you feel sick   Take 1 Diflucan tablet when you receive your medicine then take second tablet after completing all antibiotic  Labs pending 2-3 days, you will be contacted if positive for any sti and treatment will be sent to the pharmacy, you will have to return to the clinic if positive for gonorrhea to receive treatment   Please refrain from having sex until labs results, if positive please refrain from having sex until treatment complete and symptoms resolve   If positive for Chlamydia  gonorrhea or trichomoniasis please notify partner or partners so they may tested as well  Moving forward, it is recommended you use some form of protection against the transmission of  sti infections  such as condoms or dental dams with each sexual encounter     In addition: Avoid baths, hot tubs and whirlpool spas.  Don't use scented or harsh soaps Avoid irritants. These include scented tampons and  pads. Wipe from front to back after using the toilet. Don't douche. Your vagina doesn't require cleansing other than normal bathing.  Use a condom.  Wear cotton underwear, this fabric absorbs some moisture.        ED Prescriptions     Medication Sig Dispense Auth. Provider   metroNIDAZOLE (FLAGYL) 500 MG tablet Take 1 tablet (500 mg total) by mouth 2 (two) times daily. 14 tablet Philander Ake R, NP   fluconazole (DIFLUCAN) 150 MG tablet Take 1 tablet (150 mg total) by mouth once a week for 2 doses. 2 tablet Valinda Hoar, NP      PDMP not reviewed this encounter.   Valinda Hoar, NP 02/02/24 1040

## 2024-02-02 NOTE — ED Triage Notes (Signed)
 Patient states that she was treated for BV 2 weeks ago, patient states that she changed soaps and is having a fishy smell and some irritation. She wanst treatment for BV.

## 2024-02-02 NOTE — Discharge Instructions (Addendum)
 Today you are being treated prophylactically for  Bacterial vaginosis yeast  Take Metronidazole 500 mg twice a day for 7 days, do not drink alcohol while using medication, this will make you feel sick   Take 1 Diflucan tablet when you receive your medicine then take second tablet after completing all antibiotic  Labs pending 2-3 days, you will be contacted if positive for any sti and treatment will be sent to the pharmacy, you will have to return to the clinic if positive for gonorrhea to receive treatment   Please refrain from having sex until labs results, if positive please refrain from having sex until treatment complete and symptoms resolve   If positive for Chlamydia  gonorrhea or trichomoniasis please notify partner or partners so they may tested as well  Moving forward, it is recommended you use some form of protection against the transmission of sti infections  such as condoms or dental dams with each sexual encounter     In addition: Avoid baths, hot tubs and whirlpool spas.  Don't use scented or harsh soaps Avoid irritants. These include scented tampons and pads. Wipe from front to back after using the toilet. Don't douche. Your vagina doesn't require cleansing other than normal bathing.  Use a condom.  Wear cotton underwear, this fabric absorbs some moisture.

## 2024-02-03 LAB — CERVICOVAGINAL ANCILLARY ONLY
Bacterial Vaginitis (gardnerella): POSITIVE — AB
Candida Glabrata: NEGATIVE
Candida Vaginitis: NEGATIVE
Chlamydia: NEGATIVE
Comment: NEGATIVE
Comment: NEGATIVE
Comment: NEGATIVE
Comment: NEGATIVE
Comment: NEGATIVE
Comment: NORMAL
Neisseria Gonorrhea: NEGATIVE
Trichomonas: NEGATIVE

## 2024-02-06 ENCOUNTER — Telehealth (INDEPENDENT_AMBULATORY_CARE_PROVIDER_SITE_OTHER): Payer: Medicaid Other | Admitting: Psychiatry

## 2024-02-06 ENCOUNTER — Encounter: Payer: Self-pay | Admitting: Psychiatry

## 2024-02-06 DIAGNOSIS — F3175 Bipolar disorder, in partial remission, most recent episode depressed: Secondary | ICD-10-CM | POA: Diagnosis not present

## 2024-02-06 DIAGNOSIS — G4701 Insomnia due to medical condition: Secondary | ICD-10-CM | POA: Diagnosis not present

## 2024-02-06 DIAGNOSIS — R41840 Attention and concentration deficit: Secondary | ICD-10-CM | POA: Diagnosis not present

## 2024-02-06 DIAGNOSIS — F3181 Bipolar II disorder: Secondary | ICD-10-CM

## 2024-02-06 MED ORDER — LAMOTRIGINE 25 MG PO TABS: 75.0000 mg | ORAL_TABLET | ORAL | 0 refills | Status: DC

## 2024-02-06 MED ORDER — TRAZODONE HCL 50 MG PO TABS: 25.0000 mg | ORAL_TABLET | Freq: Every evening | ORAL | 1 refills | Status: DC | PRN

## 2024-02-06 MED ORDER — ESCITALOPRAM OXALATE 20 MG PO TABS: 20.0000 mg | ORAL_TABLET | Freq: Every day | ORAL | 0 refills | Status: DC

## 2024-02-06 NOTE — Progress Notes (Unsigned)
 Virtual Visit via Video Note  I connected with Tiffany Sanchez on 02/06/24 at  4:20 PM EDT by a video enabled telemedicine application and verified that I am speaking with the correct person using two identifiers.  Location Provider Location : [facility] Patient Location : Car  Participants: Patient , Provider   I discussed the limitations of evaluation and management by telemedicine and the availability of in person appointments. The patient expressed understanding and agreed to proceed.   I discussed the assessment and treatment plan with the patient. The patient was provided an opportunity to ask questions and all were answered. The patient agreed with the plan and demonstrated an understanding of the instructions.   The patient was advised to call back or seek an in-person evaluation if the symptoms worsen or if the condition fails to improve as anticipated.   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  She experiences sleep disturbances, characterized by difficulty falling asleep and waking up feeling tired, averaging five to six hours of sleep per night. Previously, quetiapine was effective for sleep, but she discontinued it due to increased appetite and weight gain, noting a gain of about three pounds. Currently, she uses hydroxyzine for sleep, but it has not been significantly effective.  She is on Wegovy, started approximately four weeks ago, shortly after her last visit. She continues to take Lamictal and Lexapro, which are effective for her mood and anxiety.  She is also taking metronidazole for bacterial vaginosis.  Denies suicidal thoughts, hallucinations, or substance use other than prescribed medications.  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 Previously, she worked from home, which was stressful and not conducive to her needs, especially with her children's situation. She is preparing to start a master's program in health service administration on February 24, 2024, to further her career opportunities.  She engages in therapy sessions every two weeks, which she finds beneficial.  She denies any other concerns today.  Visit Diagnosis:    ICD-10-CM   1. Bipolar disorder, in partial remission, most recent episode depressed (HCC)  F31.75 escitalopram (LEXAPRO) 20 MG tablet    lamoTRIgine (LAMICTAL) 25 MG tablet   Type 2    2. Insomnia due to medical condition  G47.01 traZODone (DESYREL) 50 MG tablet   Mood disorder    3. Attention and concentration deficit  R41.840       Past Psychiatric History: I have reviewed past psychiatric history from progress note on 09/19/2022.  Past trials of Wellbutrin-anger, BuSpar-noncompliant, Seroquel-weight gain, zolpidem.  Past Medical History:  Past Medical History:  Diagnosis Date   Anemia    Asthma    no hospitalizations, no recent attacks (since childhood)   Cervical dysplasia    Complication of anesthesia    one sided epidural   Dysplasia of cervix, high grade CIN 2 07/26/2023   Seen on colposcopy after ASCUS +HRHPV pap   Genital herpes 06/03/2018   History of gestational hypertension    Hypertension, benign essential, goal below  140/90 01/25/2022   Low grade squamous intraepithelial lesion (LGSIL) at risk for high grade squamous intraepithelial lesion (HGSIL) on cytologic smear of cervix 11/10/2020   Given concern about high grade lesion, colposcopy recommended. Of note, also had LGSIL pap in 2018 ([facility] results),  but had normal pap in 2019.    Ovarian cyst    Trichomonal vaginitis 04/20/2020   Late may 2021   UTI (urinary tract infection)     Past Surgical History:  Procedure Laterality Date   CESAREAN SECTION N/A 06/28/2018   Procedure: CESAREAN SECTION;  Surgeon: Levie Heritage, DO;  Location: [facility];  Service: Obstetrics;  Laterality: N/A;   THERAPEUTIC ABORTION      Family Psychiatric History: I have reviewed family psychiatric history from progress note on 09/19/2022.  Family History:  Family History  Problem Relation Age of Onset   Alcohol abuse Mother    Depression Mother    Hypertension Mother    Hypertension Father    Drug abuse Brother    Bipolar disorder Brother    Diabetes Paternal Grandfather     Social History: I have reviewed social history from progress note on 09/19/2022. Social History   Socioeconomic History   Marital status: Single    Spouse name: Not on file   Number of children: 2   Years of education: Not on file   Highest education level: Bachelor's degree (e.g., BA, AB, BS)  Occupational History   Not on file  Tobacco Use   Smoking status: Never   Smokeless tobacco: Never  Vaping Use   Vaping status: Never Used  Substance and Sexual Activity   Alcohol use: Yes    Comment: every 2-3 months   Drug use: No   Sexual activity: Yes    Birth control/protection: Condom  Other Topics Concern   Not on file  Social History Narrative   Not on file   Social Drivers of Health   Financial Resource Strain: Patient Declined (05/08/2023)   Received from [facility]   Overall Financial Resource Strain (CARDIA)    Difficulty of Paying Living Expenses: Patient declined  Food Insecurity: Patient Declined (05/08/2023)   Received from [facility]   Hunger Vital Sign    Worried About Running Out of Food in the Last Year: Patient declined    Ran Out of Food in the Last Year: Patient declined  Transportation Needs:  Patient Declined (05/08/2023)   Received from [facility]   PRAPARE - Transportation    In the past 12 months, has lack of transportation kept you from medical appointments or from getting medications?: Patient declined    Lack of Transportation (Non-Medical): Patient declined  Physical Activity: Not on file  Stress: Not on file  Social Connections: Not on file    Allergies:  Allergies  Allergen Reactions   Penicillins Other (See Comments)    Unknown reaction-childhood allergy  Has patient had a PCN reaction causing immediate rash, facial/tongue/throat swelling, SOB or lightheadedness with hypotension: Unknown Has patient had a PCN reaction causing severe rash involving mucus membranes or skin necrosis: Unknown Has patient had a PCN reaction that required hospitalization: Unknown Has patient had a PCN reaction occurring within the last 10 years: Unknown If all of the above answers are "NO", then may proceed with Cephalosporin use.     Metabolic Disorder Labs: Lab Results  Component Value Date   HGBA1C 5.2 06/19/2021   No results found for: "PROLACTIN" No  results found for: "CHOL", "TRIG", "HDL", "CHOLHDL", "VLDL", "LDLCALC" Lab Results  Component Value Date   TSH 0.734 06/19/2021    Therapeutic Level Labs: No results found for: "LITHIUM" No results found for: "VALPROATE" No results found for: "CBMZ"  Current Medications: Current Outpatient Medications  Medication Sig Dispense Refill   traZODone (DESYREL) 50 MG tablet Take 0.5-1 tablets (25-50 mg total) by mouth at bedtime as needed for sleep. 30 tablet 1   Albuterol Sulfate, sensor, 108 (90 Base) MCG/ACT AEPB Inhale into the lungs.     benzoyl peroxide-erythromycin (BENZAMYCIN) gel Apply topically 2 (two) times daily. Can bleach fabric; allow to dry completely. Cut back to once daily if too drying on skin 23.3 g 0   calcium carbonate (TUMS EX) 750 MG chewable tablet Chew by mouth.     calcium citrate  (CALCITRATE - DOSED IN MG ELEMENTAL CALCIUM) 950 (200 Ca) MG tablet Take 200 mg of elemental calcium by mouth daily.     cetirizine (ZYRTEC) 10 MG tablet Take by mouth.     escitalopram (LEXAPRO) 20 MG tablet Take 1 tablet (20 mg total) by mouth daily. 90 tablet 0   ferrous sulfate 325 (65 FE) MG tablet Take 1 tablet by mouth daily with breakfast.     fluconazole (DIFLUCAN) 150 MG tablet Take 1 tablet (150 mg total) by mouth once a week for 2 doses. 2 tablet 0   fluticasone (FLONASE) 50 MCG/ACT nasal spray Place into the nose.     hydrOXYzine (ATARAX) 10 MG tablet Take 1 tablet (10 mg total) by mouth 3 (three) times daily as needed for anxiety. 90 tablet 1   lamoTRIgine (LAMICTAL) 25 MG tablet Take 3 tablets (75 mg total) by mouth as directed. Take 1 tablet daily morning and 2 tablets daily in the evening 270 tablet 0   metroNIDAZOLE (FLAGYL) 500 MG tablet Take 1 tablet (500 mg total) by mouth 2 (two) times daily. 14 tablet 0   Multiple Vitamins-Iron (MULTIVITAMINS WITH IRON) TABS tablet Take 1 tablet by mouth daily.     norethindrone (AYGESTIN) 5 MG tablet Take 1 tablet (5 mg total) by mouth daily. 30 tablet 3   omeprazole (PRILOSEC) 20 MG capsule TAKE 1 CAPSULE EVERY DAY FOR ACID REFLUX AS NEEDED     ondansetron (ZOFRAN-ODT) 8 MG disintegrating tablet Take by mouth.     Semaglutide-Weight Management (WEGOVY) 0.25 MG/0.5ML SOAJ Inject into the skin once a week.     SUMAtriptan (IMITREX) 50 MG tablet      No current facility-administered medications for this visit.     Musculoskeletal: Strength & Muscle Tone:  UTA Gait & Station:  Seated Patient leans: N/A  Psychiatric Specialty Exam: Review of Systems  Psychiatric/Behavioral:  Positive for sleep disturbance.     Last menstrual period 08/01/2023.There is no height or weight on file to calculate BMI.  General Appearance: Casual  Eye Contact:  Fair  Speech:  Clear and Coherent  Volume:  Normal  Mood:   mood swings - improving   Affect:  Congruent  Thought Process:  Goal Directed and Descriptions of Associations: Intact  Orientation:  Full (Time, Place, and Person)  Thought Content: Logical   Suicidal Thoughts:  No  Homicidal Thoughts:  No  Memory:  Immediate;   Fair Recent;   Fair Remote;   Fair  Judgement:  Fair  Insight:  Fair  Psychomotor Activity:  Normal  Concentration:  Concentration: Fair and Attention Span: Fair  Recall:  [employer] of  Knowledge: Fair  Language: Fair  Akathisia:  No  Handed:  Right  AIMS (if indicated): not done  Assets:  Desire for Improvement Housing Social Support  ADL's:  Intact  Cognition: WNL  Sleep:  Poor   Screenings: GAD-7    Loss adjuster, chartered Office Visit from 12/24/2023 in [facility] Office Visit from 09/19/2022 in [facility]  Total GAD-7 Score 19 13      PHQ2-9    Flowsheet Row Office Visit from 12/24/2023 in [facility] Office Visit from 01/08/2023 in [facility] Video Visit from 11/01/2022 in [facility] Office Visit from 09/19/2022 in [facility]  PHQ-2 Total Score 4 4 0 2  PHQ-9 Total Score 16 -- -- 9      Flowsheet Row Video Visit from 02/06/2024 in [facility] ED from 02/02/2024 in [facility]  Video Visit from 01/06/2024 in [facility]  C-SSRS RISK CATEGORY No Risk Error: Question 6 not populated No Risk        Assessment and Plan: Tiffany Sanchez is a 28 year old African-American female, who is single, currently employed, with bipolar disorder, insomnia, attention and concentration problems was evaluated by telemedicine today.  Discussed assessment and plan as noted below.  Bipolar Disorder  in remission Currently on Lamotrigine and Lexapro for mood stabilization with effective results. - Continue Lamotrigine at 75 mg daily. - Continue Lexapro 20 mg daily - Continue Hydroxyzine 10 mg 3 times a day as needed for anxiety and sleep  Insomnia-unstable Experiencing difficulty falling asleep, averaging 5-6 hours per night. Previously discontinued quetiapine due to increased appetite and weight gain. Currently using hydroxyzine without significant improvement. Trazodone suggested for no significant weight gain. Advised on sleep hygiene and medication timing to avoid grogginess. Discussed potential side effects such as nausea, mitigated by taking with a snack. - Prescribe Trazodone starting at 50 mg, with the option to halve the tablet initially to assess tolerance. - Advise on sleep hygiene, including winding down before bedtime and avoiding electronic devices. - Instruct to take Trazodone with a snack if nausea occurs.  Rule out ADHD (Attention-Deficit/Hyperactivity Disorder) Referred to [facility] for ADHD testing, rescheduled to June due to new job. Emphasized importance of completing testing. - Encourage completion of ADHD testing at [facility] in June.  Follow-up - Follow-up in clinic in 2 to 3 months or sooner if needed by video on May 12 at 4:30 PM  Collaboration of Care: Collaboration of Care: Referral or follow-up with counselor/therapist AEB patient encouraged to continue psychotherapy.  Patient/Guardian was advised Release of Information must be obtained prior to any record release in order to collaborate their care with an outside provider. Patient/Guardian was advised if they have not already done so to contact the registration department to sign all necessary forms in order for [place] to release information regarding their care.   Consent: Patient/Guardian gives verbal consent for treatment and assignment of benefits for services provided during this visit.  Patient/Guardian expressed understanding and agreed to proceed.  Discussed the use of a AI scribe software for clinical note transcription with the patient, who gave verbal consent to proceed.  This note was generated in part or whole with voice recognition software. Voice recognition is usually quite accurate but there  are transcription errors that can and very often do occur. I apologize for any typographical errors that were not detected and corrected.     Jomarie Longs, MD 02/07/2024, 7:01 AM

## 2024-02-25 ENCOUNTER — Ambulatory Visit
Admission: EM | Admit: 2024-02-25 | Discharge: 2024-02-25 | Disposition: A | Discharge status: Home / Self Care | Attending: Emergency Medicine | Admitting: Emergency Medicine

## 2024-02-25 ENCOUNTER — Other Ambulatory Visit: Payer: Self-pay

## 2024-02-25 ENCOUNTER — Encounter: Payer: Self-pay | Admitting: Emergency Medicine

## 2024-02-25 DIAGNOSIS — N898 Other specified noninflammatory disorders of vagina: Secondary | ICD-10-CM | POA: Diagnosis not present

## 2024-02-25 MED ORDER — FLUCONAZOLE 150 MG PO TABS: 150.0000 mg | ORAL_TABLET | ORAL | 0 refills | Status: AC

## 2024-02-25 MED ORDER — METRONIDAZOLE 500 MG PO TABS: 500.0000 mg | ORAL_TABLET | Freq: Two times a day (BID) | ORAL | 0 refills | Status: DC

## 2024-02-25 NOTE — Discharge Instructions (Addendum)
 Today you are being treated prophylactically for  Bacterial vaginosis and yeast  Take Metronidazole 500 mg twice a day for 7 days, do not drink alcohol while using medication, this will make you feel sick   Take 1 tablet of Diflucan when you receive your medicine then take second dose in 1 week after you have completed all antibiotic  Bacterial vaginosis which results from an overgrowth of one on several organisms that are normally present in your vagina. Vaginosis is an inflammation of the vagina that can result in discharge, itching and pain.  Labs pending 2-3 days, you will be contacted if positive for any sti and treatment will be sent to the pharmacy, you will have to return to the clinic if positive for gonorrhea to receive treatment   Please refrain from having sex until labs results, if positive please refrain from having sex until treatment complete and symptoms resolve   If positive for HIV, Syphilis, Chlamydia  gonorrhea or trichomoniasis please notify partner or partners so they may tested as well  Moving forward, it is recommended you use some form of protection against the transmission of sti infections  such as condoms or dental dams with each sexual encounter     In addition: Avoid baths, hot tubs and whirlpool spas.  Don't use scented or harsh soaps Avoid irritants. These include scented tampons and pads. Wipe from front to back after using the toilet. Don't douche. Your vagina doesn't require cleansing other than normal bathing.  Use a condom.  Wear cotton underwear, this fabric absorbs some moisture.

## 2024-02-25 NOTE — ED Triage Notes (Signed)
 Patient presents to [facility] for evaluation of vaginal irritation and lower abdominal cramping after a new sexual partner.  Hx of BV

## 2024-02-25 NOTE — ED Provider Notes (Signed)
 Tiffany Sanchez    CSN: [id number] Arrival date & time: 02/25/24  1819      History   Chief Complaint Chief Complaint  Patient presents with   Vaginitis    HPI Tiffany Sanchez is a 28 y.o. female.   Patient presents for evaluation of Lyliana Dicenso to gray vaginal discharge, vaginal itching, a foul vaginal odor, lower abdominal and back pain present for 2 days.  Recent treatment with bacterial vaginosis, endorses all symptoms at that time have resolved.  New sexual partner, requesting full STI panel but declined HIV and syphilis.  Denies urinary symptoms.  Last menstrual period 02/19/2024.  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 Of note, also had LGSIL pap in 2018 ([facility] results), but had normal pap in 2019.    Ovarian cyst    Trichomonal vaginitis 04/20/2020   Late may 2021   UTI (urinary tract infection)     Patient Active Problem List   Diagnosis Date Noted   Attention and concentration deficit 01/06/2024   S/P bariatric surgery 05/08/2023   Iron deficiency anemia due to chronic blood loss 05/08/2023   Asthma 09/19/2022   Bipolar II disorder, mild, depressed, with anxious distress (HCC) 09/19/2022   Insomnia 09/19/2022   At risk for prolonged QT interval syndrome 08/20/2022   Abnormal uterine bleeding 06/06/2022   Hypertension, benign essential, goal below 140/90 01/25/2022    GERD (gastroesophageal reflux disease) 01/25/2022   Menorrhagia with regular cycle 01/10/2022   Dysplasia of cervix, low grade (CIN 1) 12/22/2020   Low grade squamous intraepithelial lesion (LGSIL) at risk for high grade lesion on pap smear 11/10/20 11/10/2020   Family history of breast cancer 10/22/2019   BMI 50.0-59.9, adult (HCC) 06/03/2018   Genital herpes 06/03/2018   History of gestational hypertension 12/20/2017   History of shoulder dystocia in prior pregnancy, currently pregnant 12/20/2017    Past Surgical History:  Procedure Laterality Date   CESAREAN SECTION N/A 06/28/2018   Procedure: CESAREAN SECTION;  Surgeon: Levie Heritage, DO;  [facility]: [facility];  Service: Obstetrics;  Laterality: N/A;   THERAPEUTIC ABORTION      OB History     Gravida  5   Para  2   Term  2   Preterm      AB  2   Living  2      SAB      IAB  1   Ectopic      Multiple  0   Live Births  2            Home Medications    Prior to Admission medications   Medication Sig Start Date End Date Taking? Authorizing Provider  fluconazole (DIFLUCAN) 150 MG tablet Take 1 tablet (150 mg total) by mouth once a week for 2 doses. 02/25/24 03/04/24 Yes Demetri Kerman, Elita Boone, NP  metroNIDAZOLE (FLAGYL) 500  MG tablet Take 1 tablet (500 mg total) by mouth 2 (two) times daily. 02/25/24  Yes Levaughn Puccinelli R, NP  Albuterol Sulfate, sensor, 108 (90 Base) MCG/ACT AEPB Inhale into the lungs.    [provider]  benzoyl peroxide-erythromycin (BENZAMYCIN) gel Apply topically 2 (two) times daily. Can bleach fabric; allow to dry completely. Cut back to once daily if too drying on skin 06/22/23   Crain, Whitney L, PA  calcium carbonate (TUMS EX) 750 MG chewable tablet Chew by mouth.    [provider]  calcium citrate (CALCITRATE - DOSED IN MG ELEMENTAL CALCIUM) 950 (200 Ca) MG tablet Take 200 mg of elemental calcium by mouth daily.    [provider]  cetirizine (ZYRTEC)  10 MG tablet Take by mouth.    [provider]  escitalopram (LEXAPRO) 20 MG tablet Take 1 tablet (20 mg total) by mouth daily. 02/06/24   Jomarie Longs, MD  ferrous sulfate 325 (65 FE) MG tablet Take 1 tablet by mouth daily with breakfast. 06/12/22   [provider]  fluticasone (FLONASE) 50 MCG/ACT nasal spray Place into the nose.    [provider]  hydrOXYzine (ATARAX) 10 MG tablet Take 1 tablet (10 mg total) by mouth 3 (three) times daily as needed for anxiety. 08/13/23   Jomarie Longs, MD  lamoTRIgine (LAMICTAL) 25 MG tablet Take 3 tablets (75 mg total) by mouth as directed. Take 1 tablet daily morning and 2 tablets daily in the evening 02/06/24   Jomarie Longs, MD  Multiple Vitamins-Iron (MULTIVITAMINS WITH IRON) TABS tablet Take 1 tablet by mouth daily.    [provider]  norethindrone (AYGESTIN) 5 MG tablet Take 1 tablet (5 mg total) by mouth daily. 11/26/23   Anyanwu, Jethro Bastos, MD  omeprazole (PRILOSEC) 20 MG capsule TAKE 1 CAPSULE EVERY DAY FOR ACID REFLUX AS NEEDED 11/06/21   [provider]  ondansetron (ZOFRAN-ODT) 8 MG disintegrating tablet Take by mouth. 09/18/22   [provider]  Semaglutide-Weight Management (WEGOVY) 0.25 MG/0.5ML SOAJ Inject into the skin once a week. 12/16/23   [provider]  SUMAtriptan (IMITREX) 50 MG tablet  11/06/21   [provider]  traZODone (DESYREL) 50 MG tablet Take 0.5-1 tablets (25-50 mg total) by mouth at bedtime as needed for sleep. 02/06/24   Jomarie Longs, MD    Family History Family History  Problem Relation Age of Onset   Alcohol abuse Mother    Depression Mother    Hypertension Mother    Hypertension Father    Drug abuse Brother    Bipolar disorder Brother    Diabetes Paternal Grandfather     Social History Social History   Tobacco Use   Smoking status: Never   Smokeless tobacco: Never  Vaping Use   Vaping status: Never Used  Substance Use Topics    Alcohol use: Yes    Comment: every 2-3 months   Drug use: No     Allergies   Penicillins   Review of Systems Review of Systems   Physical Exam Triage Vital Signs ED Triage Vitals [02/25/24 1842]  Encounter Vitals Group     BP 122/75     Systolic BP Percentile      Diastolic BP Percentile      Pulse Rate 96     Resp 16     Temp 98 F (36.7 C)     Temp Source Oral     SpO2 98 %     Weight  Height      Head Circumference      Peak Flow      Pain Score 4     Pain Loc      Pain Education      Exclude from Growth Chart    No data found.  Updated Vital Signs BP 122/75 (BP Location: Left Arm)   Pulse 96   Temp 98 F (36.7 C) (Oral)   Resp 16   LMP 02/19/2024 (Approximate)   SpO2 98%   Visual Acuity Right Eye Distance:   Left Eye Distance:   Bilateral Distance:    Right Eye Near:   Left Eye Near:    Bilateral Near:     Physical Exam Constitutional:      Appearance: Normal appearance.  Eyes:     Extraocular Movements: Extraocular movements intact.  Pulmonary:     Effort: Pulmonary effort is normal.  Abdominal:     Tenderness: There is no abdominal tenderness. There is no right CVA tenderness, left CVA tenderness or guarding.  Neurological:     Mental Status: She is alert and oriented to person, place, and time.      UC Treatments / Results  Labs (all labs ordered are listed, but only abnormal results are displayed) Labs Reviewed  CERVICOVAGINAL ANCILLARY ONLY    EKG   Radiology No results found.  Procedures Procedures (including critical care time)  Medications Ordered in UC Medications - No data to display  Initial Impression / Assessment and Plan / UC Course  I have reviewed the triage vital signs and the nursing notes.  Pertinent labs & imaging results that were available during my care of the patient were reviewed by me and considered in my medical decision making (see chart for details).  Vaginal discharge  Treating  prophylactically for yeast and BV, history of reoccurrence, prescribed metronidazole and Diflucan and discussed administration advised against alcohol use during treatment as well as abstinence until symptoms resolve,STI labs pending will treat per protocol, advised abstinence until lab results, and/or treatment is complete, advised condom use during all sexual encounters moving, may follow-up with urgent care as needed  Final Clinical Impressions(s) / UC Diagnoses   Final diagnoses:  Vaginal discharge     Discharge Instructions      Today you are being treated prophylactically for  Bacterial vaginosis and yeast  Take Metronidazole 500 mg twice a day for 7 days, do not drink alcohol while using medication, this will make you feel sick   Take 1 tablet of Diflucan when you receive your medicine then take second dose in 1 week after you have completed all antibiotic  Bacterial vaginosis which results from an overgrowth of one on several organisms that are normally present in your vagina. Vaginosis is an inflammation of the vagina that can result in discharge, itching and pain.  Labs pending 2-3 days, you will be contacted if positive for any sti and treatment will be sent to the pharmacy, you will have to return to the clinic if positive for gonorrhea to receive treatment   Please refrain from having sex until labs results, if positive please refrain from having sex until treatment complete and symptoms resolve   If positive for HIV, Syphilis, Chlamydia  gonorrhea or trichomoniasis please notify partner or partners so they may tested as well  Moving forward, it is recommended you use some form of protection against the transmission of sti infections  such as condoms or dental dams with each sexual  encounter     In addition: Avoid baths, hot tubs and whirlpool spas.  Don't use scented or harsh soaps Avoid irritants. These include scented tampons and pads. Wipe from front to back after  using the toilet. Don't douche. Your vagina doesn't require cleansing other than normal bathing.  Use a condom.  Wear cotton underwear, this fabric absorbs some moisture.        ED Prescriptions     Medication Sig Dispense Auth. Provider   metroNIDAZOLE (FLAGYL) 500 MG tablet Take 1 tablet (500 mg total) by mouth 2 (two) times daily. 14 tablet Janylah Belgrave R, NP   fluconazole (DIFLUCAN) 150 MG tablet Take 1 tablet (150 mg total) by mouth once a week for 2 doses. 2 tablet Valinda Hoar, NP      PDMP not reviewed this encounter.   Valinda Hoar, [place] 02/25/24 [phone]

## 2024-02-26 LAB — CERVICOVAGINAL ANCILLARY ONLY
Bacterial Vaginitis (gardnerella): NEGATIVE
Candida Glabrata: NEGATIVE
Candida Vaginitis: NEGATIVE
Chlamydia: NEGATIVE
Comment: NEGATIVE
Comment: NEGATIVE
Comment: NEGATIVE
Comment: NEGATIVE
Comment: NEGATIVE
Comment: NORMAL
Neisseria Gonorrhea: NEGATIVE
Trichomonas: NEGATIVE

## 2024-03-03 ENCOUNTER — Other Ambulatory Visit: Payer: Self-pay | Admitting: Psychiatry

## 2024-03-03 DIAGNOSIS — G4701 Insomnia due to medical condition: Secondary | ICD-10-CM

## 2024-03-13 ENCOUNTER — Other Ambulatory Visit: Payer: Self-pay | Admitting: Obstetrics & Gynecology

## 2024-03-13 DIAGNOSIS — N939 Abnormal uterine and vaginal bleeding, unspecified: Secondary | ICD-10-CM

## 2024-03-30 ENCOUNTER — Telehealth: Admitting: Psychiatry

## 2024-03-30 ENCOUNTER — Other Ambulatory Visit: Payer: Self-pay | Admitting: Psychiatry

## 2024-03-30 ENCOUNTER — Encounter: Payer: Self-pay | Admitting: Psychiatry

## 2024-03-30 DIAGNOSIS — R41840 Attention and concentration deficit: Secondary | ICD-10-CM

## 2024-03-30 DIAGNOSIS — F3176 Bipolar disorder, in full remission, most recent episode depressed: Secondary | ICD-10-CM | POA: Diagnosis not present

## 2024-03-30 DIAGNOSIS — G4701 Insomnia due to medical condition: Secondary | ICD-10-CM

## 2024-03-30 DIAGNOSIS — F3175 Bipolar disorder, in partial remission, most recent episode depressed: Secondary | ICD-10-CM

## 2024-03-30 DIAGNOSIS — T50905A Adverse effect of unspecified drugs, medicaments and biological substances, initial encounter: Secondary | ICD-10-CM | POA: Diagnosis not present

## 2024-03-30 MED ORDER — ESCITALOPRAM OXALATE 20 MG PO TABS: 10.0000 mg | ORAL_TABLET | Freq: Every day | ORAL | Status: DC

## 2024-03-30 MED ORDER — LAMOTRIGINE 100 MG PO TABS: 50.0000 mg | ORAL_TABLET | Freq: Two times a day (BID) | ORAL | 0 refills | Status: DC

## 2024-03-30 NOTE — Progress Notes (Signed)
 Virtual Visit via Video Note  I connected with Tiffany Sanchez on 03/30/24 at  4:30 PM EDT by a video enabled telemedicine application and verified that I am speaking with the correct person using two identifiers.  Location Provider Location : [facility] Patient Location : Work  Participants: Patient , Provider    I discussed the limitations of evaluation and management by telemedicine and the availability of in person appointments. The patient expressed understanding and agreed to proceed.   I discussed the assessment and treatment plan with the patient. The patient was provided an opportunity to ask questions and all were answered. The patient agreed with the plan and demonstrated an understanding of the instructions.   The patient was advised to call back or seek an in-person evaluation if the symptoms worsen or if the condition fails to improve as anticipated.   BH MD OP Progress Note  03/30/2024 4:54 PM REIKA PROSE  MRN:  [id number]  Chief Complaint:  Chief Complaint  Patient presents with   Follow-up   Depression   Anxiety   Medication Refill   Discussed the use of AI scribe software for clinical note transcription with the patient, who gave verbal consent to proceed.  History of Present Illness Tiffany Sanchez is a 28 year old African-American female, currently employed, single, lives in [place], has a history of bipolar disorder type II, insomnia, migraine headaches, history of hypertension, gastric sleeve surgery (09/03/2022) was evaluated by telemedicine today.  She has been experiencing low libido, which she attributes to her current medication, Lexapro . She has been on Lexapro  for nearly two years and believes the onset of low libido correlates with the start of this medication. She is taking Lexapro  20 mg daily.  She has a history of bipolar disorder type 2 and is currently on Lamictal  75 mg. She has previously tried Wellbutrin, which did not work for her and caused  anger issues. She was also noncompliant with Buspar in the past. She does not wish to add any new medications at this time.  Her mood symptoms are well-controlled, especially since returning to a more social work environment and attending church regularly. She is not currently in therapy due to scheduling conflicts but plans to resume once she finds a suitable therapist.  No significant depression symptoms, lack of motivation, low energy, or irritability in the past couple of weeks.  Denies thoughts of self-harm or harm to others. She reports sleeping well at night.  She is also taking Trazodone  and Hydroxyzine  as needed, with Hydroxyzine  being used more frequently due to Trazodone  causing grogginess the next day.  She denies any other concerns today.    Visit Diagnosis:    ICD-10-CM   1. Bipolar disorder, in full remission, most recent episode depressed (HCC)  F31.76 escitalopram  (LEXAPRO ) 20 MG tablet    lamoTRIgine  (LAMICTAL ) 100 MG tablet   Type 2    2. Insomnia due to medical condition  G47.01    Mood disorder    3. Adverse effect of drug, initial encounter  T50.905A    SSRI    4. Attention and concentration deficit  R41.840       Past Psychiatric History: I have reviewed past psychiatric history from progress note on 09/19/2022.  Past trials of Wellbutrin-anger, BuSpar-noncompliant, Seroquel -weight gain, zolpidem .  Past Medical History:  Past Medical History:  Diagnosis Date   Anemia    Asthma    no hospitalizations, no recent attacks (since childhood)   Cervical dysplasia  Complication of anesthesia    one sided epidural   Dysplasia of cervix, high grade CIN 2 07/26/2023   Seen on colposcopy after ASCUS +HRHPV pap   Genital herpes 06/03/2018   History of gestational hypertension    Hypertension, benign essential, goal below 140/90 01/25/2022   Low grade squamous intraepithelial lesion (LGSIL) at risk for high grade squamous intraepithelial lesion (HGSIL) on  cytologic smear of cervix 11/10/2020   Given concern about high grade lesion, colposcopy recommended. Of note, also had LGSIL pap in 2018 ([facility] results), but had normal pap in 2019.    Ovarian cyst    Trichomonal vaginitis 04/20/2020   Late may 2021   UTI (urinary tract infection)     Past Surgical History:  Procedure Laterality Date   CESAREAN SECTION N/A 06/28/2018   Procedure: CESAREAN SECTION;  Surgeon: Malka Sea, DO;  Location: [facility];  Service: Obstetrics;  Laterality: N/A;   THERAPEUTIC ABORTION      Family Psychiatric History: I have reviewed family psychiatric history from progress note on 09/19/2022.  Family History:  Family History  Problem Relation Age of Onset   Alcohol abuse Mother    Depression Mother    Hypertension Mother    Hypertension Father    Drug abuse Brother    Bipolar disorder Brother    Diabetes Paternal Grandfather     Social History: I have reviewed social history from progress note on 09/19/2022. Social History   Socioeconomic History   Marital status: Single    Spouse name: Not on file   Number of children: 2   Years of education: Not on file   Highest education level: Bachelor's degree (e.g., BA, AB, BS)  Occupational History   Occupation: CMA    Comment: [employer]  Tobacco Use   Smoking status: Never   Smokeless tobacco: Never  Vaping Use   Vaping status: Never Used  Substance and Sexual Activity   Alcohol use: Yes    Comment: every 2-3 months   Drug use: No   Sexual activity: Yes    Birth control/protection: Condom  Other Topics Concern   Not on file  Social History Narrative   Not on file   Social Drivers of Health   Financial Resource Strain: Patient Declined (05/08/2023)   Received [facility]   Overall Financial Resource Strain (CARDIA)    Difficulty of Paying Living Expenses: Patient declined  Food Insecurity: Patient Declined (05/08/2023)   Received from [facility]   Hunger Vital Sign    Worried About Running Out of Food in the Last Year: Patient declined    Ran Out of Food in the Last Year: Patient declined  Transportation Needs: Patient Declined (05/08/2023)   Received from [facility]   PRAPARE - Transportation    In the past 12 months, has lack of transportation kept you from medical appointments or from getting medications?: Patient declined    Lack of Transportation (Non-Medical): Patient declined  Physical Activity: Not on file  Stress: Not on file  Social Connections: Not on file    Allergies:  Allergies  Allergen Reactions   Penicillins Other (See Comments)    Unknown reaction-childhood allergy  Has patient had a PCN reaction causing immediate rash, facial/tongue/throat swelling, SOB or lightheadedness with hypotension: Unknown Has patient had a PCN reaction causing severe rash involving mucus membranes or skin necrosis: Unknown Has patient had a PCN reaction that required hospitalization: Unknown Has  patient had a PCN reaction occurring within the last 10 years: Unknown If all of the above answers are "NO", then may proceed with Cephalosporin use.     Metabolic Disorder Labs: Lab Results  Component Value Date   HGBA1C 5.2 06/19/2021   No results found for: "PROLACTIN" No results found for: "CHOL", "TRIG", "HDL", "CHOLHDL", "VLDL", "LDLCALC" Lab Results  Component Value Date   TSH 0.734 06/19/2021    Therapeutic Level Labs: No results found for: "LITHIUM" No results found for: "VALPROATE" No results found for: "CBMZ"  Current Medications: Current Outpatient Medications  Medication Sig Dispense Refill   lamoTRIgine  (LAMICTAL ) 100 MG tablet Take 0.5 tablets (50 mg total) by mouth 2 (two) times daily. 90 tablet 0   Albuterol  Sulfate, sensor, 108 (90 Base) MCG/ACT AEPB Inhale into the lungs.     benzoyl peroxide -erythromycin  (BENZAMYCIN) gel Apply topically 2 (two) times daily. Can  bleach fabric; allow to dry completely. Cut back to once daily if too drying on skin 23.3 g 0   calcium carbonate (TUMS EX) 750 MG chewable tablet Chew by mouth.     calcium citrate (CALCITRATE - DOSED IN MG ELEMENTAL CALCIUM) 950 (200 Ca) MG tablet Take 200 mg of elemental calcium by mouth daily.     cetirizine (ZYRTEC) 10 MG tablet Take by mouth.     escitalopram  (LEXAPRO ) 20 MG tablet Take 0.5 tablets (10 mg total) by mouth daily.     ferrous sulfate  325 (65 FE) MG tablet Take 1 tablet by mouth daily with breakfast.     fluticasone (FLONASE) 50 MCG/ACT nasal spray Place into the nose.     hydrOXYzine  (ATARAX ) 10 MG tablet Take 1 tablet (10 mg total) by mouth 3 (three) times daily as needed for anxiety. 90 tablet 1   metroNIDAZOLE  (FLAGYL ) 500 MG tablet Take 1 tablet (500 mg total) by mouth 2 (two) times daily. 14 tablet 0   Multiple Vitamins-Iron (MULTIVITAMINS WITH IRON) TABS tablet Take 1 tablet by mouth daily.     norethindrone  (AYGESTIN ) 5 MG tablet Take 1 tablet (5 mg total) by mouth daily. 30 tablet 3   omeprazole (PRILOSEC) 20 MG capsule TAKE 1 CAPSULE EVERY DAY FOR ACID REFLUX AS NEEDED     ondansetron  (ZOFRAN -ODT) 8 MG disintegrating tablet Take by mouth.     Semaglutide-Weight Management (WEGOVY) 0.25 MG/0.5ML SOAJ Inject into the skin once a week.     SUMAtriptan (IMITREX) 50 MG tablet      traZODone  (DESYREL ) 50 MG tablet Take 0.5-1 tablets (25-50 mg total) by mouth at bedtime as needed for sleep. 90 tablet 0   No current facility-administered medications for this visit.     Musculoskeletal: Strength & Muscle Tone: UTA Gait & Station: Seated Patient leans: N/A  Psychiatric Specialty Exam: Review of Systems  Psychiatric/Behavioral: Negative.      Last menstrual period 08/01/2023.There is no height or weight on file to calculate BMI.  General Appearance: Casual  Eye Contact:  Fair  Speech:  Normal Rate  Volume:  Normal  Mood:  Euthymic  Affect:  Appropriate  Thought  Process:  Goal Directed and Descriptions of Associations: Intact  Orientation:  Full (Time, Place, and Person)  Thought Content: Logical   Suicidal Thoughts:  No  Homicidal Thoughts:  No  Memory:  Immediate;   Fair Recent;   Fair Remote;   Fair  Judgement:  Fair  Insight:  Fair  Psychomotor Activity:  Normal  Concentration:  Concentration: Fair and Attention Span: Fair  Recall:  Iona Manis of Knowledge: Fair  Language: Fair  Akathisia:  No  Handed:  Right  AIMS (if indicated): not done  Assets:  Desire for Improvement Housing Social Support Transportation  ADL's:  Intact  Cognition: WNL  Sleep:  Fair   Screenings: GAD-7    Garment/textile technologist Visit from 12/24/2023 in [facility] Office Visit from 09/19/2022 in [facility]  Total GAD-7 Score 19 13      PHQ2-9    Flowsheet Row Office Visit from 12/24/2023 in [facility] Office Visit from 01/08/2023 in [facility] [place] Video Visit from 11/01/2022 in [facility] Office Visit from 09/19/2022 in [facility]  PHQ-2 Total Score 4 4 0 2  PHQ-9 Total Score 16 -- -- 9      Flowsheet Row Video Visit from 03/30/2024 in [facility] Video Visit from 02/06/2024 in [facility] UC from 02/02/2024 in [facility]   C-SSRS RISK CATEGORY No Risk No Risk Error: Question 6 not populated        Assessment and Plan: BRITTINE FEARNOW is a 28 year old African-American female, who has a history of bipolar disorder, insomnia was evaluated by telemedicine today.  Discussed assessment and plan as noted below.  Bipolar disorder in remission Currently on Lamotrigine  and Lexapro  which has been  beneficial for mood stabilization.  However does report side effects of Lexapro , sexual side effects. - We will change Lamictal  to 100 mg daily in divided dosage since Lexapro  is being tapered off. - Reduce Lexapro  to 10 mg daily for now. - Continue Hydroxyzine  10 mg 3 times a day as needed for anxiety and sleep - Continue psychotherapy sessions.  Insomnia-improving Currently reports sleep is improving on the trazodone .  Trazodone  does make her groggy the next day and she prefers hydroxyzine  as needed.  That has been beneficial as well. - Continue Trazodone  50 mg at bedtime. - Could use Hydroxyzine  as needed for sleep as well. - Continue sleep hygiene techniques.  Sexual side effects of SSRI-unstable Currently struggling with sexual side effects to Lexapro .  Will start reducing the dosage of Lexapro .  Discussed adding Wellbutrin for SSRI induced sexual side effects however she has tried Wellbutrin previously and did not tolerate it. - Reduce Lexapro  to 10 mg daily. - Patient to reach out to this provider in 3 to 4 weeks for further instructions regarding tapering.  Rule out ADHD-referred to neuropsychiatry-pending testing.  Follow-up Follow-up in clinic in 2 to 3 months or sooner if needed.     Collaboration of Care: Collaboration of Care: Referral or follow-up with counselor/therapist AEB encouraged to follow up with therapist.  Patient/Guardian was advised Release of Information must be obtained prior to any record release in order to collaborate their care with an outside provider. Patient/Guardian was advised if they have not already done so to contact the registration department to sign all necessary forms in order for us  to release information regarding their care.   Consent: Patient/Guardian gives verbal consent for treatment and assignment of benefits for services provided during this visit. Patient/Guardian expressed understanding and agreed to proceed.  This note was generated  in part or whole with voice recognition software. Voice recognition is usually quite accurate but there are transcription  errors that can and very often do occur. I apologize for any typographical errors that were not detected and corrected.     Trenee Igoe, MD 03/30/2024, 4:54 PM

## 2024-04-06 ENCOUNTER — Telehealth: Payer: Self-pay

## 2024-04-06 NOTE — Telephone Encounter (Signed)
 Asked pt if she had made contact with [facility] and received f/u QFT test. She said no to both but appreciated the reminder and does want to get the test taken care of. She has an upcoming PCP appointment and prefers to get QFT there. I said she can also reach out to the health department to get the test done.   Shellia Dial, RN

## 2024-04-20 DIAGNOSIS — R41840 Attention and concentration deficit: Secondary | ICD-10-CM

## 2024-04-22 NOTE — Progress Notes (Signed)
 Contact to TB disease. Patient previously notified of need to repeat TB screening test. No testing results or additional notification from patient. Closure letter mailed. Michele Ahle, RN

## 2024-04-24 ENCOUNTER — Telehealth: Admitting: Psychiatry

## 2024-04-24 ENCOUNTER — Encounter: Payer: Self-pay | Admitting: Psychiatry

## 2024-04-24 DIAGNOSIS — F3176 Bipolar disorder, in full remission, most recent episode depressed: Secondary | ICD-10-CM | POA: Insufficient documentation

## 2024-04-24 DIAGNOSIS — R41840 Attention and concentration deficit: Secondary | ICD-10-CM

## 2024-04-24 DIAGNOSIS — G4701 Insomnia due to medical condition: Secondary | ICD-10-CM | POA: Diagnosis not present

## 2024-04-24 DIAGNOSIS — T50905A Adverse effect of unspecified drugs, medicaments and biological substances, initial encounter: Secondary | ICD-10-CM

## 2024-04-24 NOTE — Progress Notes (Signed)
 Virtual Visit via Video Note  I connected with Tiffany Sanchez on 04/24/24 at 10:30 AM EDT by a video enabled telemedicine application and verified that I am speaking with the correct person using two identifiers.  Location Provider Location : [facility] Patient Location : Work  Participants: Patient , Provider   I discussed the limitations of evaluation and management by telemedicine and the availability of in person appointments. The patient expressed understanding and agreed to proceed.   I discussed the assessment and treatment plan with the patient. The patient was provided an opportunity to ask questions and all were answered. The patient agreed with the plan and demonstrated an understanding of the instructions.   The patient was advised to call back or seek an in-person evaluation if the symptoms worsen or if the condition fails to improve as anticipated.  [facility] MD OP Progress Note  04/24/2024 12:06 PM Tiffany Sanchez  MRN:  [id number]  Chief Complaint:  Chief Complaint  Patient presents with   Follow-up   Anxiety   Depression   Medication Refill   Discussed the use of AI scribe software for clinical note transcription with the patient, who gave verbal consent to proceed.  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  She has a history of bipolar disorder type two and insomnia. Her current medication regimen includes Lexapro  10 mg, reduced from 20 mg.  Lexapro  was reduced for sexual side effects.  She has not noticed significant changes in mood since the reduction, but reports increased fatigue and decreased motivation. She recalls having more energy on the higher dose. Over the past week, she feels slightly better.  She is also  taking Lamictal  100 mg daily in divided doses. She reports sleeping well and maintains a work schedule from 8 AM to 5 PM, with varying levels of busyness. She prefers to stay busy and balances her workload throughout the week.  For physical activity, she walks during her lunch break for about 45 minutes, covering at least a mile and a half. She has a history of vitamin D deficiency but has not followed up on it recently. She plans to have her vitamin levels checked at an upcoming physical appointment.  She is currently on Wegovy at a dose of 2.7 mg, which she acknowledges can cause fatigue and depression.    Denies thoughts of self-harm or harm to others.  She is currently awaiting ADHD testing, was referred to Dr. Cheryll Corti recently.   Visit Diagnosis:    ICD-10-CM   1. Bipolar disorder, in full remission, most recent episode depressed (HCC)  F31.76    Type 2    2. Insomnia due to medical condition  G47.01    Bipolar disorder    3. Adverse effect of drug, initial encounter  T50.905A     4. Attention and concentration deficit  R41.840       Past Psychiatric History: I have reviewed past psychiatric history from progress note on 09/19/2022.  Past trials of Wellbutrin-anger, BuSpar-noncompliant.  Past Medical History:  Past Medical History:  Diagnosis Date   Anemia    Asthma    no hospitalizations, no recent attacks (since childhood)   Cervical dysplasia    Complication of anesthesia    one sided epidural   Dysplasia of cervix, high  grade CIN 2 07/26/2023   Seen on colposcopy after ASCUS +HRHPV pap   Genital herpes 06/03/2018   History of gestational hypertension    Hypertension, benign essential, goal below 140/90 01/25/2022   Low grade squamous intraepithelial lesion (LGSIL) at risk for high grade squamous intraepithelial lesion (HGSIL) on cytologic smear of cervix 11/10/2020   Given concern about high grade lesion, colposcopy recommended. Of note, also had LGSIL pap in 2018  ([facility] results), but had normal pap in 2019.    Ovarian cyst    Trichomonal vaginitis 04/20/2020   Late may 2021   UTI (urinary tract infection)     Past Surgical History:  Procedure Laterality Date   CESAREAN SECTION N/A 06/28/2018   Procedure: CESAREAN SECTION;  Surgeon: Malka Sea, DO;  [facility]: [facility];  Service: Obstetrics;  Laterality: N/A;   THERAPEUTIC ABORTION      Family Psychiatric History: I have reviewed family psychiatric history from progress note on 09/19/2022.  Family History:  Family History  Problem Relation Age of Onset   Alcohol abuse Mother    Depression Mother    Hypertension Mother    Hypertension Father    Drug abuse Brother    Bipolar disorder Brother    Diabetes Paternal Grandfather     Social History: I have reviewed social history from progress note on 09/19/2022. Social History   Socioeconomic History   Marital status: Single    Spouse name: Not on file   Number of children: 2   Years of education: Not on file   Highest education level: Bachelor's degree (e.g., BA, AB, BS)  Occupational History   Occupation: CMA    Comment: [employer]  Tobacco Use   Smoking status: Never   Smokeless tobacco: Never  Vaping Use   Vaping status: Never Used  Substance and Sexual Activity   Alcohol use: Yes    Comment: every 2-3 months   Drug use: No   Sexual activity: Yes    Birth control/protection: Condom  Other Topics Concern   Not on file  Social History Narrative   Not on file   Social Drivers of Health   Financial Resource Strain: Patient Declined (05/08/2023)   Received from [facility]   Overall Financial Resource Strain (CARDIA)    Difficulty of Paying Living Expenses: Patient declined  Food Insecurity: Patient Declined (05/08/2023)   Received from [facility]   Hunger Vital Sign    Worried About Running Out of Food in the Last Year: Patient declined    Ran Out of Food in  the Last Year: Patient declined  Transportation Needs: Patient Declined (05/08/2023)   Received from [facility]   PRAPARE - Transportation    In the past 12 months, has lack of transportation kept you from medical appointments or from getting medications?: Patient declined    Lack of Transportation (Non-Medical): Patient declined  Physical Activity: Not on file  Stress: Not on file  Social Connections: Not on file    Allergies:  Allergies  Allergen Reactions   Penicillins Other (See Comments)    Unknown reaction-childhood allergy  Has patient had a PCN reaction causing immediate rash, facial/tongue/throat swelling, SOB or lightheadedness with hypotension: Unknown Has patient had a PCN reaction causing severe rash involving mucus membranes or skin necrosis: Unknown Has patient had a PCN reaction that required hospitalization: Unknown Has patient had a PCN reaction occurring within the last 10 years: Unknown If all of  the above answers are "NO", then may proceed with Cephalosporin use.     Metabolic Disorder Labs: Lab Results  Component Value Date   HGBA1C 5.2 06/19/2021   No results found for: "PROLACTIN" No results found for: "CHOL", "TRIG", "HDL", "CHOLHDL", "VLDL", "LDLCALC" Lab Results  Component Value Date   TSH 0.734 06/19/2021    Therapeutic Level Labs: No results found for: "LITHIUM" No results found for: "VALPROATE" No results found for: "CBMZ"  Current Medications: Current Outpatient Medications  Medication Sig Dispense Refill   Albuterol  Sulfate, sensor, 108 (90 Base) MCG/ACT AEPB Inhale into the lungs.     benzoyl peroxide -erythromycin  (BENZAMYCIN) gel Apply topically 2 (two) times daily. Can bleach fabric; allow to dry completely. Cut back to once daily if too drying on skin 23.3 g 0   calcium carbonate (TUMS EX) 750 MG chewable tablet Chew by mouth.     calcium citrate (CALCITRATE - DOSED IN MG ELEMENTAL CALCIUM) 950 (200 Ca) MG tablet  Take 200 mg of elemental calcium by mouth daily.     cetirizine (ZYRTEC) 10 MG tablet Take by mouth.     escitalopram  (LEXAPRO ) 20 MG tablet Take 0.5 tablets (10 mg total) by mouth daily.     ferrous sulfate  325 (65 FE) MG tablet Take 1 tablet by mouth daily with breakfast.     fluticasone (FLONASE) 50 MCG/ACT nasal spray Place into the nose.     hydrOXYzine  (ATARAX ) 10 MG tablet Take 1 tablet (10 mg total) by mouth 3 (three) times daily as needed for anxiety. 90 tablet 1   lamoTRIgine  (LAMICTAL ) 100 MG tablet Take 0.5 tablets (50 mg total) by mouth 2 (two) times daily. 90 tablet 0   metroNIDAZOLE  (FLAGYL ) 500 MG tablet Take 1 tablet (500 mg total) by mouth 2 (two) times daily. 14 tablet 0   Multiple Vitamins-Iron (MULTIVITAMINS WITH IRON) TABS tablet Take 1 tablet by mouth daily.     norethindrone  (AYGESTIN ) 5 MG tablet Take 1 tablet (5 mg total) by mouth daily. 30 tablet 3   omeprazole (PRILOSEC) 20 MG capsule TAKE 1 CAPSULE EVERY DAY FOR ACID REFLUX AS NEEDED     ondansetron  (ZOFRAN -ODT) 8 MG disintegrating tablet Take by mouth.     Semaglutide-Weight Management (WEGOVY) 0.25 MG/0.5ML SOAJ Inject into the skin once a week.     SUMAtriptan (IMITREX) 50 MG tablet      traZODone  (DESYREL ) 50 MG tablet Take 0.5-1 tablets (25-50 mg total) by mouth at bedtime as needed for sleep. 90 tablet 0   No current facility-administered medications for this visit.     Musculoskeletal: Strength & Muscle Tone: UTA Gait & Station: Seated Patient leans: N/A  Psychiatric Specialty Exam: Review of Systems  Psychiatric/Behavioral: Negative.      Last menstrual period 08/01/2023.There is no height or weight on file to calculate BMI.  General Appearance: Casual  Eye Contact:  Fair  Speech:  Clear and Coherent  Volume:  Normal  Mood:  Euthymic  Affect:  Congruent  Thought Process:  Goal Directed and Descriptions of Associations: Intact  Orientation:  Full (Time, Place, and Person)  Thought Content:  Logical   Suicidal Thoughts:  No  Homicidal Thoughts:  No  Memory:  Immediate;   Fair Recent;   Fair Remote;   Fair  Judgement:  Fair  Insight:  Fair  Psychomotor Activity:  Normal  Concentration:  Concentration: Fair and Attention Span: Fair  Recall:  [employer] of Knowledge: Fair  Language: Fair  Akathisia:  No  Handed:  Right  AIMS (if indicated): not done  Assets:  Communication Skills Desire for Improvement Housing Social Support  ADL's:  Intact  Cognition: WNL  Sleep:  Fair   Screenings: GAD-7    Garment/textile technologist Visit from 12/24/2023 in [facility] Office Visit from 09/19/2022 in [facility]  Total GAD-7 Score 19 13      PHQ2-9    Flowsheet Row Office Visit from 12/24/2023 in [facility] Office Visit from 01/08/2023 in [facility] [place] [facility] Video Visit from 11/01/2022 in [facility] Office Visit from 09/19/2022 in [facility]  PHQ-2 Total Score 4 4 0 2  PHQ-9 Total Score 16 -- -- 9      Flowsheet Row Video Visit from 04/24/2024 in [facility] Video Visit from 03/30/2024 in [facility] Video Visit from 02/06/2024 in [facility]  C-SSRS RISK CATEGORY No Risk No Risk No Risk        Assessment and Plan: Tiffany Sanchez is a 28 year old African-American female who is single, has a history of bipolar disorder, insomnia, migraine headaches was evaluated by telemedicine today.  Discussed assessment and plan as noted below.  Bipolar disorder type II depressed with anxious distress moderate-in remission Currently on a lower dosage of Lexapro  dose reduced due to sexual side effects last visit.  Overall  reports mood symptoms are stable on the current combination of Lamictal  and Lexapro . Continue Lexapro  10 mg daily-reduced dosage Continue Lamictal  100 mg daily divided dosage Continue Hydroxyzine  10 mg 3 times a day as needed for anxiety Continue psychotherapy sessions  Insomnia-stable Currently denies any significant sleep problems. Continue Trazodone  50 mg at bedtime Continue Hydroxyzine  as needed at bedtime. Continue sleep hygiene techniques  Sexual side effects of SSRI-improving Currently tolerating the lower dosage of Lexapro  better. Will reevaluate in future sessions.  Rule out ADHD-referred to Dr. Augustine Blocker.  Follow-up Follow-up in clinic in 3 months or sooner in person.  Collaboration of Care: Collaboration of Care: Primary Care Provider AEB patient encouraged to follow up with primary care provider to rule out other causes of fatigue as well as discussed side effects of Wegovy including fatigue and depression.  Patient/Guardian was advised Release of Information must be obtained prior to any record release in order to collaborate their care with an outside provider. Patient/Guardian was advised if they have not already done so to contact the registration department to sign all necessary forms in order for us  to release information regarding their care.   Consent: Patient/Guardian gives verbal consent for treatment and assignment of benefits for services provided during this visit. Patient/Guardian expressed understanding and agreed to proceed.   This note was generated in part or whole with voice recognition software. Voice recognition is usually quite accurate but there are transcription errors that can and very often do occur. I apologize for any typographical errors that were not detected and corrected.    Anakaren Campion, MD 04/24/2024, 12:06 PM

## 2024-04-29 ENCOUNTER — Encounter: Payer: Self-pay | Admitting: Psychology

## 2024-05-01 ENCOUNTER — Ambulatory Visit
Admission: EM | Admit: 2024-05-01 | Discharge: 2024-05-01 | Disposition: A | Discharge status: Home / Self Care | Attending: Emergency Medicine | Admitting: Emergency Medicine

## 2024-05-01 DIAGNOSIS — H00011 Hordeolum externum right upper eyelid: Secondary | ICD-10-CM | POA: Diagnosis not present

## 2024-05-01 MED ORDER — ERYTHROMYCIN 5 MG/GM OP OINT: TOPICAL_OINTMENT | OPHTHALMIC | 0 refills | Status: AC

## 2024-05-01 NOTE — ED Triage Notes (Signed)
 Patient to Urgent Care with complaints of a stye to her upper right eyelid.   Symptoms started apport 1 week ago. Reports she had individual eyelashes put on approx 2 weeks ago.   Using hot compresses.

## 2024-05-01 NOTE — Discharge Instructions (Addendum)
Use the antibiotic eye ointment as prescribed.    Follow-up with your primary care provider if your symptoms are not improving.    Go to the emergency department if you have acute eye pain, changes in your vision, or other concerning symptoms.    

## 2024-05-01 NOTE — ED Provider Notes (Signed)
 Tiffany Sanchez    CSN: [id number] Arrival date & time: 05/01/24  1741      History   Chief Complaint Chief Complaint  Patient presents with   Eye Problem    HPI Tiffany Sanchez is a 28 y.o. female.  Patient presents with a pustule on her lash line of her right upper eyelid x 1 week.  She has been using warm compresses.  Her symptoms started a week after she had fake eyelashes applied.  The fake eyelashes are applied to each of her eyelashes individually and so she has not been able to remove them without pulling her own eyelashes out each time.  No change in vision, eye drainage, fever, chills.  No OTC medications today.  The history is provided by the patient and medical records.    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 Of note, also had LGSIL pap in 2018 ([facility] results), but had normal pap in 2019.    Ovarian cyst    Trichomonal vaginitis 04/20/2020   Late may 2021   UTI (urinary tract infection)     Patient Active Problem List   Diagnosis Date Noted   Drug reaction 04/24/2024   Bipolar disorder, in full remission, most recent episode depressed (HCC) 04/24/2024   Bipolar disorder, in partial remission, most recent episode depressed (HCC) 03/30/2024   Attention and concentration deficit 01/06/2024   S/P bariatric surgery 05/08/2023   Iron deficiency anemia due to  chronic blood loss 05/08/2023   Asthma 09/19/2022   Bipolar II disorder, mild, depressed, with anxious distress (HCC) 09/19/2022   Insomnia 09/19/2022   At risk for prolonged QT interval syndrome 08/20/2022   Abnormal uterine bleeding 06/06/2022   Hypertension, benign essential, goal below 140/90 01/25/2022   GERD (gastroesophageal reflux disease) 01/25/2022   Menorrhagia with regular cycle 01/10/2022   Dysplasia of cervix, low grade (CIN 1) 12/22/2020   Low grade squamous intraepithelial lesion (LGSIL) at risk for high grade lesion on pap smear 11/10/20 11/10/2020   Family history of breast cancer 10/22/2019   BMI 50.0-59.9, adult (HCC) 06/03/2018   Genital herpes 06/03/2018   History of gestational hypertension 12/20/2017   History of shoulder dystocia in prior pregnancy, currently pregnant 12/20/2017    Past Surgical History:  Procedure Laterality Date   CESAREAN SECTION N/A 06/28/2018   Procedure: CESAREAN SECTION;  Surgeon: Malka Sea, DO;  [facility]: [facility];  Service: Obstetrics;  Laterality: N/A;   THERAPEUTIC ABORTION      OB History     Gravida  5   Para  2   Term  2   Preterm      AB  2   Living  2      SAB      IAB  1   Ectopic      Multiple  0   Live Births  2            Home Medications    Prior to Admission medications   Medication Sig Start Date End Date Taking? Authorizing Provider  erythromycin  ophthalmic ointment Place a 1/2 inch ribbon of ointment into the lower eyelid four times a day for 7 days. 05/01/24  Yes Wellington Half, NP  Albuterol  Sulfate, sensor, 108 (90 Base) MCG/ACT AEPB Inhale into the lungs.    [provider]  benzoyl peroxide -erythromycin  (BENZAMYCIN) gel Apply topically 2 (two) times daily. Can bleach fabric; allow to dry completely. Cut back to once daily if too drying on skin Patient not taking: Reported on 05/01/2024 06/22/23   Crain, Whitney L, PA  calcium carbonate (TUMS EX) 750 MG chewable  tablet Chew by mouth.    [provider]  calcium citrate (CALCITRATE - DOSED IN MG ELEMENTAL CALCIUM) 950 (200 Ca) MG tablet Take 200 mg of elemental calcium by mouth daily.    [provider]  cetirizine (ZYRTEC) 10 MG tablet Take by mouth.    [provider]  escitalopram  (LEXAPRO ) 20 MG tablet Take 0.5 tablets (10 mg total) by mouth daily. 03/30/24   Eappen, Saramma, MD  ferrous sulfate  325 (65 FE) MG tablet Take 1 tablet by mouth daily with breakfast. 06/12/22   [provider]  fluticasone (FLONASE) 50 MCG/ACT nasal spray Place into the nose.    [provider]  hydrOXYzine  (ATARAX ) 10 MG tablet Take 1 tablet (10 mg total) by mouth 3 (three) times daily as needed for anxiety. 08/13/23   Eappen, Saramma, MD  lamoTRIgine  (LAMICTAL ) 100 MG tablet Take 0.5 tablets (50 mg total) by mouth 2 (two) times daily. 03/30/24   Eappen, Saramma, MD  metroNIDAZOLE  (FLAGYL ) 500 MG tablet Take 1 tablet (500 mg total) by mouth 2 (two) times daily. Patient not taking: Reported on 05/01/2024 02/25/24   Reena Canning, NP  Multiple Vitamins-Iron (MULTIVITAMINS WITH IRON) TABS tablet Take 1 tablet by mouth daily.    [provider]  norethindrone  (AYGESTIN ) 5 MG tablet Take 1 tablet (5 mg total) by mouth daily. 11/26/23   Anyanwu, Ugonna A, MD  omeprazole (PRILOSEC) 20 MG capsule TAKE 1 CAPSULE EVERY DAY FOR ACID REFLUX AS NEEDED 11/06/21   [provider]  ondansetron  (ZOFRAN -ODT) 8 MG disintegrating tablet Take by mouth. Patient not taking: Reported on 05/01/2024 09/18/22   [provider]  Semaglutide-Weight Management (WEGOVY) 0.25 MG/0.5ML SOAJ Inject into the skin once a week. 12/16/23   [provider]  SUMAtriptan (IMITREX) 50 MG tablet  11/06/21   [provider]  traZODone  (DESYREL ) 50 MG tablet Take 0.5-1 tablets (25-50 mg total) by mouth at bedtime as needed for sleep. 03/03/24 06/01/24  Todd Fossa, MD    Family  History Family History  Problem Relation Age of Onset   Alcohol abuse Mother    Depression Mother    Hypertension Mother    Hypertension Father    Drug abuse Brother    Bipolar disorder Brother    Diabetes Paternal Grandfather     Social History Social History   Tobacco Use   Smoking status: Never   Smokeless tobacco: Never  Vaping Use   Vaping status: Never Used  Substance Use Topics   Alcohol use: Yes    Comment: every 2-3 months   Drug use: No     Allergies   Penicillins   Review of Systems Review of Systems  Constitutional:  Negative for chills and  fever.  HENT:  Negative for ear pain and sore throat.   Eyes:  Negative for pain, discharge and visual disturbance.  Respiratory:  Negative for cough and shortness of breath.      Physical Exam Triage Vital Signs ED Triage Vitals  Encounter Vitals Group     BP 05/01/24 1749 115/75     Girls Systolic BP Percentile --      Girls Diastolic BP Percentile --      Boys Systolic BP Percentile --      Boys Diastolic BP Percentile --      Pulse Rate 05/01/24 1749 92     Resp 05/01/24 1749 18     Temp 05/01/24 1749 97.7 F (36.5 C)     Temp src --      SpO2 05/01/24 1749 100 %     Weight --      Height --      Head Circumference --      Peak Flow --      Pain Score 05/01/24 1751 5     Pain Loc --      Pain Education --      Exclude from Growth Chart --    No data found.  Updated Vital Signs BP 115/75   Pulse 92   Temp 97.7 F (36.5 C)   Resp 18   LMP 04/06/2024 (Approximate)   SpO2 100%   Visual Acuity Right Eye Distance: 20/20 Left Eye Distance: 20/20 Bilateral Distance: 20/20  Right Eye Near:   Left Eye Near:    Bilateral Near:     Physical Exam Constitutional:      General: She is not in acute distress. HENT:     Mouth/Throat:     Mouth: Mucous membranes are moist.   Eyes:     General:        Right eye: No discharge.        Left eye: No discharge.     Extraocular Movements:  Extraocular movements intact.     Conjunctiva/sclera: Conjunctivae normal.     Pupils: Pupils are equal, round, and reactive to light.     Cardiovascular:     Rate and Rhythm: Normal rate and regular rhythm.  Pulmonary:     Effort: Pulmonary effort is normal. No respiratory distress.   Neurological:     Mental Status: She is alert.      UC Treatments / Results  Labs (all labs ordered are listed, but only abnormal results are displayed) Labs Reviewed - No data to display  EKG   Radiology No results found.  Procedures Procedures (including critical care time)  Medications Ordered in UC Medications - No data to display  Initial Impression / Assessment and Plan / UC Course  I have reviewed the triage vital signs and the nursing notes.  Pertinent labs & imaging results that were available during my care of the patient were reviewed by me and considered in my medical decision making (see chart for details).    Hordeolum of right upper eyelid.  Afebrile and vital signs are stable.  Treating with erythromycin  eye ointment and warm compresses.  Education provided on stye.  Instructed her to follow-up with her PCP or eye care provider if her symptoms are not improving.  ED precautions given.  She agrees to plan of care.  Final Clinical Impressions(s) / UC Diagnoses   Final diagnoses:  Hordeolum externum of right upper eyelid     Discharge Instructions  Use the antibiotic eye ointment as prescribed.    Follow-up with your primary care provider if your symptoms are not improving.    Go to the emergency department if you have acute eye pain, changes in your vision, or other concerning symptoms.        ED Prescriptions     Medication Sig Dispense Auth. Provider   erythromycin  ophthalmic ointment Place a 1/2 inch ribbon of ointment into the lower eyelid four times a day for 7 days. 3.5 g Wellington Half, NP      PDMP not reviewed this encounter.   Wellington Half, NP 05/01/24 1816

## 2024-05-31 ENCOUNTER — Other Ambulatory Visit: Payer: Self-pay | Admitting: Psychiatry

## 2024-05-31 DIAGNOSIS — G4701 Insomnia due to medical condition: Secondary | ICD-10-CM

## 2024-06-01 ENCOUNTER — Telehealth: Payer: Self-pay | Admitting: Psychiatry

## 2024-06-01 DIAGNOSIS — G4701 Insomnia due to medical condition: Secondary | ICD-10-CM

## 2024-06-01 MED ORDER — TRAZODONE HCL 50 MG PO TABS
25.0000 mg | ORAL_TABLET | Freq: Every evening | ORAL | 1 refills | Status: AC | PRN
Start: 2024-06-01 — End: ?

## 2024-06-01 NOTE — Telephone Encounter (Signed)
 left message that rx was sent to the pharmacy

## 2024-06-01 NOTE — Telephone Encounter (Signed)
I have sent trazodone to pharmacy as requested.

## 2024-06-03 ENCOUNTER — Encounter: Attending: Psychology | Admitting: Psychology

## 2024-06-09 ENCOUNTER — Ambulatory Visit
Admission: EM | Admit: 2024-06-09 | Discharge: 2024-06-09 | Disposition: A | Discharge status: Home / Self Care | Attending: Emergency Medicine | Admitting: Emergency Medicine

## 2024-06-09 DIAGNOSIS — R350 Frequency of micturition: Secondary | ICD-10-CM | POA: Insufficient documentation

## 2024-06-09 DIAGNOSIS — N898 Other specified noninflammatory disorders of vagina: Secondary | ICD-10-CM | POA: Diagnosis present

## 2024-06-09 LAB — POCT URINALYSIS DIP (MANUAL ENTRY)
Blood, UA: NEGATIVE
Glucose, UA: NEGATIVE mg/dL
Leukocytes, UA: NEGATIVE
Nitrite, UA: NEGATIVE
Protein Ur, POC: 30 mg/dL — AB
Spec Grav, UA: >=1.03 — AB
Urobilinogen, UA: 1 U/dL
pH, UA: 5.5

## 2024-06-09 LAB — POCT URINE PREGNANCY: Preg Test, Ur: NEGATIVE

## 2024-06-09 MED ORDER — METRONIDAZOLE 500 MG PO TABS: 500.0000 mg | ORAL_TABLET | Freq: Two times a day (BID) | ORAL | 0 refills | Status: AC

## 2024-06-09 MED ORDER — FLUCONAZOLE 150 MG PO TABS: 150.0000 mg | ORAL_TABLET | ORAL | 0 refills | Status: AC

## 2024-06-09 NOTE — Discharge Instructions (Addendum)
 Today you are being treated bacterial vaginosis and yeast based on your symptoms  Analysis is negative for bladder infection, has been sent to the lab for 3 days to see if bacteria will grow, you will be notified if this occurs and antibiotic started  Take Metronidazole  500 mg twice a day for 7 days, do not drink alcohol while using medication, this will make you feel sick   Take Diflucan  tablet when you receive your medicine then take second tablet after completion of all antibiotic  Bacterial vaginosis which results from an overgrowth of one on several organisms that are normally present in your vagina. Vaginosis is an inflammation of the vagina that can result in discharge, itching and pain.  Labs pending 2-3 days, you will be contacted if positive for any sti and treatment will be sent to the pharmacy, you will have to return to the clinic if positive for gonorrhea to receive treatment   Please refrain from having sex until labs results, if positive please refrain from having sex until treatment complete and symptoms resolve   If positive for  Chlamydia  gonorrhea or trichomoniasis please notify partner or partners so they may tested as well  Moving forward, it is recommended you use some form of protection against the transmission of sti infections  such as condoms or dental dams with each sexual encounter     In addition: Avoid baths, hot tubs and whirlpool spas.  Don't use scented or harsh soaps Avoid irritants. These include scented tampons and pads. Wipe from front to back after using the toilet. Don't douche. Your vagina doesn't require cleansing other than normal bathing.  Use a condom.  Wear cotton underwear, this fabric absorbs some moisture.

## 2024-06-09 NOTE — ED Provider Notes (Signed)
 Tiffany Sanchez    CSN: [id number] Arrival date & time: 06/09/24  1626      History   Chief Complaint Chief Complaint  Patient presents with   Dysuria    HPI Tiffany Sanchez is a 28 y.o. female.   Patient presents for evaluation of mild vaginal odor, mild vaginal itching, gray to Tiffany Sanchez vaginal discharge, flank pain, lower abdominal pain, dysuria and urinary frequency present for 2 weeks.  Has not attempted treatment.  Symptoms started after sexual encounter but no known exposure.  Last menstrual period approximately May 08, 2024.  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 Of note, also had LGSIL pap in 2018 ([facility] results), but had normal pap in 2019.    Ovarian cyst    Trichomonal vaginitis 04/20/2020   Late may 2021   UTI (urinary tract infection)     Patient Active Problem List   Diagnosis Date Noted   Drug reaction 04/24/2024   Bipolar disorder, in full remission, most recent episode depressed (HCC) 04/24/2024   Bipolar disorder, in partial remission, most recent episode depressed (HCC) 03/30/2024   Attention and concentration deficit 01/06/2024   S/P bariatric surgery 05/08/2023   Iron deficiency anemia due to chronic blood loss 05/08/2023   Asthma 09/19/2022   Bipolar II disorder, mild, depressed, with anxious distress (HCC) 09/19/2022   Insomnia 09/19/2022   At  risk for prolonged QT interval syndrome 08/20/2022   Abnormal uterine bleeding 06/06/2022   Hypertension, benign essential, goal below 140/90 01/25/2022   GERD (gastroesophageal reflux disease) 01/25/2022   Menorrhagia with regular cycle 01/10/2022   Dysplasia of cervix, low grade (CIN 1) 12/22/2020   Low grade squamous intraepithelial lesion (LGSIL) at risk for high grade lesion on pap smear 11/10/20 11/10/2020   Family history of breast cancer 10/22/2019   BMI 50.0-59.9, adult (HCC) 06/03/2018   Genital herpes 06/03/2018   History of gestational hypertension 12/20/2017   History of shoulder dystocia in prior pregnancy, currently pregnant 12/20/2017    Past Surgical History:  Procedure Laterality Date   CESAREAN SECTION N/A 06/28/2018   Procedure: CESAREAN SECTION;  Surgeon: Barbra Lang PARAS, DO;  Location: [facility];  Service: Obstetrics;  Laterality: N/A;   THERAPEUTIC ABORTION      OB History     Gravida  5   Para  2   Term  2   Preterm      AB  2   Living  2      SAB      IAB  1   Ectopic      Multiple  0   Live Births  2            Home Medications    Prior to Admission medications   Medication Sig Start Date End Date Taking? Authorizing Provider  cholecalciferol (VITAMIN D3) 25 MCG (1000 UNIT) tablet  Take 1,000 Units by mouth daily.   Yes [provider]  fluconazole  (DIFLUCAN ) 150 MG tablet Take 1 tablet (150 mg total) by mouth once a week for 2 doses. 06/09/24 06/17/24 Yes Keslyn Teater R, NP  metroNIDAZOLE  (FLAGYL ) 500 MG tablet Take 1 tablet (500 mg total) by mouth 2 (two) times daily. 06/09/24  Yes Griffon Herberg R, NP  Albuterol  Sulfate, sensor, 108 (90 Base) MCG/ACT AEPB Inhale into the lungs.    [provider]  benzoyl peroxide -erythromycin  (BENZAMYCIN) gel Apply topically 2 (two) times daily. Can bleach fabric; allow to dry completely. Cut back to once daily if too drying on skin Patient not taking: Reported on  05/01/2024 06/22/23   Crain, Whitney L, PA  calcium carbonate (TUMS EX) 750 MG chewable tablet Chew by mouth.    [provider]  calcium citrate (CALCITRATE - DOSED IN MG ELEMENTAL CALCIUM) 950 (200 Ca) MG tablet Take 200 mg of elemental calcium by mouth daily.    [provider]  cetirizine (ZYRTEC) 10 MG tablet Take by mouth.    [provider]  erythromycin  ophthalmic ointment Place a 1/2 inch ribbon of ointment into the lower eyelid four times a day for 7 days. Patient not taking: Reported on 06/09/2024 05/01/24   Corlis Burnard DEL, NP  escitalopram  (LEXAPRO ) 20 MG tablet Take 0.5 tablets (10 mg total) by mouth daily. 03/30/24   Eappen, Saramma, MD  ferrous sulfate  325 (65 FE) MG tablet Take 1 tablet by mouth daily with breakfast. 06/12/22   [provider]  fluticasone (FLONASE) 50 MCG/ACT nasal spray Place into the nose.    [provider]  hydrOXYzine  (ATARAX ) 10 MG tablet Take 1 tablet (10 mg total) by mouth 3 (three) times daily as needed for anxiety. 08/13/23   Eappen, Saramma, MD  lamoTRIgine  (LAMICTAL ) 100 MG tablet Take 0.5 tablets (50 mg total) by mouth 2 (two) times daily. 03/30/24   Eappen, Saramma, MD  Multiple Vitamins-Iron (MULTIVITAMINS WITH IRON) TABS tablet Take 1 tablet by mouth daily.    [provider]  norethindrone  (AYGESTIN ) 5 MG tablet Take 1 tablet (5 mg total) by mouth daily. Patient not taking: Reported on 06/09/2024 11/26/23   Anyanwu, Ugonna A, MD  omeprazole (PRILOSEC) 20 MG capsule TAKE 1 CAPSULE EVERY DAY FOR ACID REFLUX AS NEEDED 11/06/21   [provider]  ondansetron  (ZOFRAN -ODT) 8 MG disintegrating tablet Take by mouth. Patient not taking: Reported on 05/01/2024 09/18/22   [provider]  Semaglutide-Weight Management (WEGOVY) 0.25 MG/0.5ML SOAJ Inject into the skin once a week. 12/16/23   [provider]  SUMAtriptan (IMITREX) 50 MG tablet  11/06/21   [provider]  traZODone   (DESYREL ) 50 MG tablet Take 0.5-1 tablets (25-50 mg total) by mouth at bedtime as needed for sleep. Patient not taking: Reported on 06/09/2024 06/01/24   Eappen, Saramma, MD    Family History Family History  Problem Relation Age of Onset   Alcohol abuse Mother    Depression Mother    Hypertension Mother    Hypertension Father    Drug abuse Brother    Bipolar disorder Brother    Diabetes Paternal Grandfather     Social History Social History   Tobacco Use   Smoking status: Never   Smokeless tobacco: Never  Vaping Use   Vaping status: Never Used  Substance Use Topics   Alcohol use: Yes    Comment: every 2-3 months   Drug use: No  Allergies   Penicillins   Review of Systems Review of Systems   Physical Exam Triage Vital Signs ED Triage Vitals [06/09/24 1643]  Encounter Vitals Group     BP      Girls Systolic BP Percentile      Girls Diastolic BP Percentile      Boys Systolic BP Percentile      Boys Diastolic BP Percentile      Pulse      Resp      Temp      Temp src      SpO2      Weight      Height      Head Circumference      Peak Flow      Pain Score 5     Pain Loc      Pain Education      Exclude from Growth Chart    No data found.  Updated Vital Signs LMP 05/08/2024 (Approximate)   Visual Acuity Right Eye Distance:   Left Eye Distance:   Bilateral Distance:    Right Eye Near:   Left Eye Near:    Bilateral Near:     Physical Exam Constitutional:      Appearance: Normal appearance.  Eyes:     Extraocular Movements: Extraocular movements intact.  Pulmonary:     Effort: Pulmonary effort is normal.  Abdominal:     Tenderness: There is no abdominal tenderness. There is no right CVA tenderness, left CVA tenderness or guarding.  Genitourinary:    Comments: deferred Neurological:     Mental Status: She is alert and oriented to person, place, and time. Mental status is at baseline.      UC Treatments / Results  Labs (all labs  ordered are listed, but only abnormal results are displayed) Labs Reviewed  POCT URINALYSIS DIP (MANUAL ENTRY) - Abnormal; Notable for the following components:      Result Value   Bilirubin, UA small (*)    Ketones, POC UA trace (5) (*)    Spec Grav, UA >=1.030 (*)    Protein Ur, POC =30 (*)    All other components within normal limits  URINE CULTURE  POCT URINE PREGNANCY  CERVICOVAGINAL ANCILLARY ONLY    EKG   Radiology No results found.  Procedures Procedures (including critical care time)  Medications Ordered in UC Medications - No data to display  Initial Impression / Assessment and Plan / UC Course  I have reviewed the triage vital signs and the nursing notes.  Pertinent labs & imaging results that were available during my care of the patient were reviewed by me and considered in my medical decision making (see chart for details).  Vaginal discharge, urinary frequency  Urinalysis negative, sent for culture, based on symptomology treating empirically for yeast and bacterial vaginosis, history of reoccurrence, prescribed metronidazole  and Diflucan , discussed administration, urine pregnancy test negative, recommended nonpharmacological supportive measures, STI labs pending, will treat additionally per protocol, advised abstinence until lab results, treatment is complete and all symptoms have resolved, may follow-up with urgent care as needed Final Clinical Impressions(s) / UC Diagnoses   Final diagnoses:  Urinary frequency  Vaginal discharge     Discharge Instructions      Today you are being treated bacterial vaginosis and yeast based on your symptoms  Analysis is negative for bladder infection, has been sent to the lab for 3 days to see if bacteria will grow, you will be notified if this  occurs and antibiotic started  Take Metronidazole  500 mg twice a day for 7 days, do not drink alcohol while using medication, this will make you feel sick   Take Diflucan   tablet when you receive your medicine then take second tablet after completion of all antibiotic  Bacterial vaginosis which results from an overgrowth of one on several organisms that are normally present in your vagina. Vaginosis is an inflammation of the vagina that can result in discharge, itching and pain.  Labs pending 2-3 days, you will be contacted if positive for any sti and treatment will be sent to the pharmacy, you will have to return to the clinic if positive for gonorrhea to receive treatment   Please refrain from having sex until labs results, if positive please refrain from having sex until treatment complete and symptoms resolve   If positive for  Chlamydia  gonorrhea or trichomoniasis please notify partner or partners so they may tested as well  Moving forward, it is recommended you use some form of protection against the transmission of sti infections  such as condoms or dental dams with each sexual encounter     In addition: Avoid baths, hot tubs and whirlpool spas.  Don't use scented or harsh soaps Avoid irritants. These include scented tampons and pads. Wipe from front to back after using the toilet. Don't douche. Your vagina doesn't require cleansing other than normal bathing.  Use a condom.  Wear cotton underwear, this fabric absorbs some moisture.        ED Prescriptions     Medication Sig Dispense Auth. Provider   metroNIDAZOLE  (FLAGYL ) 500 MG tablet Take 1 tablet (500 mg total) by mouth 2 (two) times daily. 14 tablet Kynadi Dragos R, NP   fluconazole  (DIFLUCAN ) 150 MG tablet Take 1 tablet (150 mg total) by mouth once a week for 2 doses. 2 tablet Paxton Binns R, NP      PDMP not reviewed this encounter.   Tiffany Shelba SAUNDERS, NP 06/09/24 1701

## 2024-06-09 NOTE — ED Triage Notes (Signed)
 Patient to Urgent Care with complaints of dysuria/ urinary urgency/ flank pain. Also w/ complaints of grey vaginal discharge with fishy odor.   Symptoms x2weeks. Started taking azo w/ complete relief in symptoms. Returned Saturday.   Reports hx of UTI's only responsive to bactrim .

## 2024-06-10 LAB — CERVICOVAGINAL ANCILLARY ONLY
Bacterial Vaginitis (gardnerella): NEGATIVE
Candida Glabrata: NEGATIVE
Candida Vaginitis: NEGATIVE
Chlamydia: NEGATIVE
Comment: NEGATIVE
Comment: NEGATIVE
Comment: NEGATIVE
Comment: NEGATIVE
Comment: NEGATIVE
Comment: NORMAL
Neisseria Gonorrhea: NEGATIVE
Trichomonas: NEGATIVE

## 2024-06-10 LAB — URINE CULTURE: Culture: NO GROWTH

## 2024-06-11 ENCOUNTER — Ambulatory Visit (HOSPITAL_COMMUNITY): Payer: Self-pay

## 2024-06-27 ENCOUNTER — Other Ambulatory Visit: Payer: Self-pay | Admitting: Psychiatry

## 2024-06-27 DIAGNOSIS — F3176 Bipolar disorder, in full remission, most recent episode depressed: Secondary | ICD-10-CM

## 2024-06-30 NOTE — Progress Notes (Signed)
 Patient reported to [facility] department as contact with tuberculosis disease, needs repeat [email or web address] No response from patient to phone calls or letter. Follow-up closed at this time, however, if patient returns to clinic and/or request TB screening test, this can be offered free of charges to her. Almarie Metro, RN

## 2024-07-10 ENCOUNTER — Other Ambulatory Visit: Payer: Self-pay | Admitting: Psychiatry

## 2024-07-10 DIAGNOSIS — F3176 Bipolar disorder, in full remission, most recent episode depressed: Secondary | ICD-10-CM

## 2024-07-13 ENCOUNTER — Telehealth: Admitting: Family Medicine

## 2024-07-13 DIAGNOSIS — A6000 Herpesviral infection of urogenital system, unspecified: Secondary | ICD-10-CM

## 2024-07-13 MED ORDER — VALACYCLOVIR HCL 500 MG PO TABS: 500.0000 mg | ORAL_TABLET | Freq: Two times a day (BID) | ORAL | 0 refills | Status: DC

## 2024-07-13 NOTE — Progress Notes (Signed)
 E-Visit for Herpes Simplex  We are sorry that you are not feeling well.  Here is how we plan to help!  Based on what you have shared ith me, it looks like you may be having an outbreak/flare-up of genital herpes.    I have prescribed I have prescribed Valacyclovir 500 mg Take one by mouth twice a day for 3 days.    If you have been prescribed long term medications to be taken on a regular basis, it is important to follow the recommendations and take them as ordered.    Outbreaks usually include blisters and open sores in the genital area. Outbreaks that happen after the first time are usually not as severe and do not last as long. Genital Herpes Simplex is a commonly sexually transmitted viral infection that is found worldwide. Most of these genital infections are caused by one or two herpes simplex viruses that is passed from person to person during vaginal, oral, or anal sex. Sometimes, people do not know they have herpes because they do not have any symptoms.  Please be aware that if you have genital herpes you can be contagious even when you are not having rash or flare-up and you may not have any symptoms, even when you are taking suppressive medicines.  Herpes cannot be cured. The disease usually causes most problems during the first few years. After that, the virus is still there, but it causes few to no symptoms. Even when the virus is active, people with herpes can take medicines to reduce and help prevent symptoms.  Herpes is an infection that can cause blisters and open sores on the genital area. Herpes is caused by a virus that is passed from person to person during vaginal, oral, or anal sex. Sometimes, people do not know they have herpes because they do not have any symptoms. Herpes cannot be cured. The disease usually causes most problems during the first few years. After that, the virus is still there, but it causes few to no symptoms. Even when the virus is active, people with herpes  can take medicines to reduce and help prevent symptoms.  If you have been prescribed medications to be taken on a regular basis, it is important to follow the recommendations and take them as ordered.  Some people with herpes never have any symptoms. But other people can develop symptoms within a few weeks of being infected with the herpes virus   Symptoms usually include blisters in the genital area. In women, this area includes the vagina, buttocks, anus, or thighs. In men, this area includes the penis, scrotum, anus, butt, or thighs. The blisters can become painful open sores, which then crust over as they heal. Sometimes, people can have other symptoms that include:  ?Blisters on the mouth or lips ?Fever, headache, or pain in the joints ?Trouble urinating  Outbreaks might occur every month or more often, or just once or twice a year. Sometimes, people can tell when an outbreak will occur, because they feel itching or pain beforehand. Sometimes they do not know that an outbreak is coming because they have no symptoms. Whatever your pattern is, keep in mind that herpes outbreaks usually become less frequent over time as you get older. Certain things, called "triggers," can make outbreaks more likely to occur. These include stress, sunlight, menstrual periods,or getting sick.  Antiviral therapy can shorten the duration of symptoms and signs in primary infection, which, when untreated, can be associated with significant increase in the  symptoms of the disease.  HOME CARE Use a portable bath (such as a "Sitz bath") where you can sit in warm water for about 20 minutes. Your bathtub could also work. Avoid bubble baths.  Keep the genital area clean and dry and avoid tight clothes.  Take over-the-counter pain medicine such as acetaminophen (brand name: Tylenol) or ibuprofen sample brand names: Advil, Motrin). But avoid aspirin.  Only take medications as instructed by your medical team.  You are  most likely to spread herpes to a sex partner when you have blisters and open sores on your body. But it's also possible to spread herpes to your partner when you do not have any symptoms. That is because herpes can be present on your body without causing any symptoms, like blisters or pain.  Telling your sex partner that you have herpes can be hard. But it can help protect them, since there are ways to lower the risk of spreading the infection.   Using a condom every time you have sex  Not having sex when you have symptoms  Not having oral sex if you have blisters or open sores (in the genital area or around your mouth)  MAKE SURE YOU   Understand these instructions. Do not have sex without using a condom until you have been seen by a doctor and as instructed by the provider If you are not better or improved within 7 days, you MUST have a follow up at your doctor or the health department for evaluation. There are other causes of rashes in the genital region.  Thank you for choosing an e-visit.  Your e-visit answers were reviewed by a board certified advanced clinical practitioner to complete your personal care plan. Depending upon the condition, your plan could have included both over the counter or prescription medications.  Please review your pharmacy choice. Make sure the pharmacy is open so you can pick up prescription now. If there is a problem, you may contact your provider through [employer] and have the prescription routed to another pharmacy.  Your safety is important to [place]. If you have drug allergies check your prescription carefully.   For the next 24 hours you can use MyChart to ask questions about today's visit, request a non-urgent call back, or ask for a work or school excuse. You will get an email in the next two days asking about your experience. I hope that your e-visit has been valuable and will speed your recovery.     I provided 5 minutes of non face-to-face  time during this encounter for chart review, medication and order placement, as well as and documentation.

## 2024-07-16 ENCOUNTER — Telehealth: Admitting: Physician Assistant

## 2024-07-16 DIAGNOSIS — A6000 Herpesviral infection of urogenital system, unspecified: Secondary | ICD-10-CM

## 2024-07-16 MED ORDER — VALACYCLOVIR HCL 500 MG PO TABS: 500.0000 mg | ORAL_TABLET | Freq: Two times a day (BID) | ORAL | 0 refills | Status: AC

## 2024-07-16 NOTE — Progress Notes (Signed)
 F/U from e-visit earlier this week regarding medication. No charge.

## 2024-07-23 ENCOUNTER — Telehealth: Admitting: Psychiatry

## 2024-07-23 ENCOUNTER — Encounter: Payer: Self-pay | Admitting: Psychiatry

## 2024-07-23 DIAGNOSIS — F3176 Bipolar disorder, in full remission, most recent episode depressed: Secondary | ICD-10-CM

## 2024-07-23 DIAGNOSIS — R41840 Attention and concentration deficit: Secondary | ICD-10-CM | POA: Diagnosis not present

## 2024-07-23 DIAGNOSIS — G4700 Insomnia, unspecified: Secondary | ICD-10-CM

## 2024-07-23 DIAGNOSIS — G4701 Insomnia due to medical condition: Secondary | ICD-10-CM

## 2024-07-23 NOTE — Progress Notes (Signed)
 Virtual Visit via Video Note  I connected with Tiffany Sanchez on 07/23/24 at 11:00 AM EDT by a video enabled telemedicine application and verified that I am speaking with the correct person using two identifiers.  Location Provider Location : [facility] Patient Location : Work  Participants: Patient , Provider    I discussed the limitations of evaluation and management by telemedicine and the availability of in person appointments. The patient expressed understanding and agreed to proceed.   I discussed the assessment and treatment plan with the patient. The patient was provided an opportunity to ask questions and all were answered. The patient agreed with the plan and demonstrated an understanding of the instructions.   The patient was advised to call back or seek an in-person evaluation if the symptoms worsen or if the condition fails to improve as anticipated.  BH MD OP Progress Note  07/24/2024 7:08 AM Tiffany Sanchez  MRN:  [id number]  Chief Complaint:  Chief Complaint  Patient presents with   Follow-up   Anxiety   Depression   Medication Refill   Discussed the use of AI scribe software for clinical note transcription with the patient, who gave verbal consent to proceed.  History of Present Illness Tiffany Sanchez is a 28 year old African-American female, currently employed, single, lives in [place], has a history of bipolar disorder type II, insomnia, migraine headaches, history of hypertension, gastric sleeve surgery, was evaluated by telemedicine today.    She describes significant improvement in mood and overall functioning since her last visit in June, stating she is doing good and much better lately. Although the summer proved particularly difficult for her mood, she notes notable stabilization since then. Improved energy and motivation have resolved previous issues with fatigue and decreased motivation. She believes resuming vitamin D supplementation has positively impacted  her mood.  Her current regimen includes lamotrigine  100 mg in divided doses for mood symptoms and Lexapro  10 mg daily. She reports that reducing the Lexapro  dosage has alleviated previous sexual side effects. She uses hydroxyzine  10 mg up to 3 times daily as needed for anxiety. She denies any changes to her medication regimen since the last visit, aside from resuming vitamin D supplementation.  She reports that her sleep quality remains good, with an average of 7 to 8 hours per night without difficulty. She maintains focus and keeps up with work assignments and projects.   She continues monthly therapy sessions at [facility], and she finds therapy helpful.  She has not yet completed ADHD testing due to scheduling conflicts at work but intends to reschedule when possible.  She denies any current suicidality, homicidality or perceptual disturbances.   Visit Diagnosis:    ICD-10-CM   1. Bipolar disorder, in full remission, most recent episode depressed (HCC)  F31.76    Type II    2. Insomnia due to medical condition  G47.01    Bipolar disorder    3. Attention and concentration deficit  R41.840       Past Psychiatric History: I have reviewed past psychiatric history from progress note on 09/19/2022.  Past trials of Wellbutrin-anger, BuSpar-noncompliant  Past Medical History:  Past Medical History:  Diagnosis Date   Anemia    Asthma    no hospitalizations, no recent attacks (since childhood)   Cervical dysplasia    Complication of anesthesia    one sided epidural   Dysplasia of cervix, high grade CIN 2 07/26/2023   Seen on colposcopy after ASCUS +HRHPV pap  Genital herpes 06/03/2018   History of gestational hypertension    Hypertension, benign essential, goal below 140/90 01/25/2022   Low grade squamous intraepithelial lesion (LGSIL) at risk for high grade squamous intraepithelial lesion (HGSIL) on cytologic smear of cervix 11/10/2020   Given concern about high grade lesion,  colposcopy recommended. Of note, also had LGSIL pap in 2018 ([facility] results), but had normal pap in 2019.    Ovarian cyst    Trichomonal vaginitis 04/20/2020   Late may 2021   UTI (urinary tract infection)     Past Surgical History:  Procedure Laterality Date   CESAREAN SECTION N/A 06/28/2018   Procedure: CESAREAN SECTION;  Surgeon: Barbra Lang PARAS, DO;  Location: [facility];  Service: Obstetrics;  Laterality: N/A;   THERAPEUTIC ABORTION      Family Psychiatric History: Reviewed family psychiatric history from progress note on 09/19/2022.  Family History:  Family History  Problem Relation Age of Onset   Alcohol abuse Mother    Depression Mother    Hypertension Mother    Hypertension Father    Drug abuse Brother    Bipolar disorder Brother    Diabetes Paternal Grandfather     Social History: I have reviewed social history from progress note on 09/19/2022. Social History   Socioeconomic History   Marital status: Single    Spouse name: Not on file   Number of children: 2   Years of education: Not on file   Highest education level: Bachelor's degree (e.g., BA, AB, BS)  Occupational History   Occupation: CMA    Comment: [employer]  Tobacco Use   Smoking status: Never   Smokeless tobacco: Never  Vaping Use   Vaping status: Never Used  Substance and Sexual Activity   Alcohol use: Yes    Comment: every 2-3 months   Drug use: No   Sexual activity: Yes    Birth control/protection: Condom  Other Topics Concern   Not on file  Social History Narrative   Not on file   Social Drivers of Health   Financial Resource Strain: Low Risk  (05/11/2024)   Received from [facility]   Overall Financial Resource Strain (CARDIA)    Difficulty of Paying Living Expenses: Not hard at all  Food Insecurity: No Food Insecurity (05/11/2024)   [facility]   Hunger Vital Sign    Within the past 12 months, you worried that  your food would run out before you got the money to buy more.: Never true    Within the past 12 months, the food you bought just didn't last and you didn't have money to get more.: Never true  Transportation Needs: No Transportation Needs (05/11/2024)   Received from [facility]    In the past 12 months, has lack of transportation kept you from medical appointments or from getting medications?: No    Lack of Transportation (Non-Medical): No  Physical Activity: Not on file  Stress: Not on file  Social Connections: Not on file    Allergies:  Allergies  Allergen Reactions   Penicillins Other (See Comments)    Unknown reaction-childhood allergy  Has patient had a PCN reaction causing immediate rash, facial/tongue/throat swelling, SOB or lightheadedness with hypotension: Unknown Has patient had a PCN reaction causing severe rash involving mucus membranes or skin necrosis: Unknown Has patient had a PCN reaction that required hospitalization: Unknown Has patient had a PCN reaction occurring  within the last 10 years: Unknown If all of the above answers are NO, then may proceed with Cephalosporin use.     Metabolic Disorder Labs: Lab Results  Component Value Date   HGBA1C 5.2 06/19/2021   No results found for: PROLACTIN No results found for: CHOL, TRIG, HDL, CHOLHDL, VLDL, LDLCALC Lab Results  Component Value Date   TSH 0.734 06/19/2021    Therapeutic Level Labs: No results found for: LITHIUM No results found for: VALPROATE No results found for: CBMZ  Current Medications: Current Outpatient Medications  Medication Sig Dispense Refill   valACYclovir  (VALTREX ) 1000 MG tablet      Albuterol  Sulfate, sensor, 108 (90 Base) MCG/ACT AEPB Inhale into the lungs.     benzoyl peroxide -erythromycin  (BENZAMYCIN) gel Apply topically 2 (two) times daily. Can bleach fabric; allow to dry completely. Cut back to once daily if too  drying on skin (Patient not taking: Reported on 05/01/2024) 23.3 g 0   calcium carbonate (TUMS EX) 750 MG chewable tablet Chew by mouth.     calcium citrate (CALCITRATE - DOSED IN MG ELEMENTAL CALCIUM) 950 (200 Ca) MG tablet Take 200 mg of elemental calcium by mouth daily.     cetirizine (ZYRTEC) 10 MG tablet Take by mouth.     cholecalciferol (VITAMIN D3) 25 MCG (1000 UNIT) tablet Take 1,000 Units by mouth daily.     erythromycin  ophthalmic ointment Place a 1/2 inch ribbon of ointment into the lower eyelid four times a day for 7 days. (Patient not taking: Reported on 06/09/2024) 3.5 g 0   escitalopram  (LEXAPRO ) 20 MG tablet Take 0.5 tablets (10 mg total) by mouth daily. 45 tablet 0   ferrous sulfate  325 (65 FE) MG tablet Take 1 tablet by mouth daily with breakfast.     fluticasone (FLONASE) 50 MCG/ACT nasal spray Place into the nose.     hydrOXYzine  (ATARAX ) 10 MG tablet Take 1 tablet (10 mg total) by mouth 3 (three) times daily as needed for anxiety. 90 tablet 1   lamoTRIgine  (LAMICTAL ) 100 MG tablet TAKE 0.5 TABLETS BY MOUTH 2 TIMES DAILY. 90 tablet 0   metroNIDAZOLE  (FLAGYL ) 500 MG tablet Take 1 tablet (500 mg total) by mouth 2 (two) times daily. 14 tablet 0   Multiple Vitamins-Iron (MULTIVITAMINS WITH IRON) TABS tablet Take 1 tablet by mouth daily.     norethindrone  (AYGESTIN ) 5 MG tablet Take 1 tablet (5 mg total) by mouth daily. (Patient not taking: Reported on 06/09/2024) 30 tablet 3   omeprazole (PRILOSEC) 20 MG capsule TAKE 1 CAPSULE EVERY DAY FOR ACID REFLUX AS NEEDED     ondansetron  (ZOFRAN -ODT) 8 MG disintegrating tablet Take by mouth. (Patient not taking: Reported on 05/01/2024)     Semaglutide-Weight Management (WEGOVY) 0.25 MG/0.5ML SOAJ Inject into the skin once a week.     SUMAtriptan (IMITREX) 50 MG tablet  (Patient not taking: Reported on 05/01/2024)     traZODone  (DESYREL ) 50 MG tablet Take 0.5-1 tablets (25-50 mg total) by mouth at bedtime as needed for sleep. (Patient not taking:  Reported on 06/09/2024) 90 tablet 1   No current facility-administered medications for this visit.     Musculoskeletal: Strength & Muscle Tone: UTA Gait & Station: Seated Patient leans: N/A  Psychiatric Specialty Exam: Review of Systems  Psychiatric/Behavioral: Negative.      Last menstrual period 08/01/2023.There is no height or weight on file to calculate BMI.  General Appearance: Casual  Eye Contact:  Fair  Speech:  Normal Rate  Volume:  Normal  Mood:  Euthymic  Affect:  Congruent  Thought Process:  Goal Directed and Descriptions of Associations: Intact  Orientation:  Full (Time, Place, and Person)  Thought Content: Logical   Suicidal Thoughts:  No  Homicidal Thoughts:  No  Memory:  Immediate;   Fair Recent;   Fair Remote;   Fair  Judgement:  Fair  [facility]:  Fair  Psychomotor Activity:  Normal  Concentration:  Concentration: Fair and Attention Span: Fair  Recall:  [employer] of Knowledge: Fair  Language: Fair  Akathisia:  No  Handed:  Right  AIMS (if indicated): not done  Assets:  Manufacturing systems engineer Desire for Improvement Housing Social Support Transportation  ADL's:  Intact  Cognition: WNL  Sleep:  Fair   Screenings: GAD-7    Garment/textile technologist Visit from 12/24/2023 in [facility] Office Visit from 09/19/2022 in [facility]  Total GAD-7 Score 19 13   PHQ2-9    Flowsheet Row Office Visit from 12/24/2023 in [facility] Office Visit from 01/08/2023 in [facility] [place] [facility] Video Visit from 11/01/2022 in [facility] Office Visit from 09/19/2022 in [facility]  PHQ-2 Total Score 4 4 0 2  PHQ-9 Total Score 16 -- -- 9   Flowsheet Row Video Visit from 07/23/2024 in [facility]  UC from 06/09/2024 in [facility]  UC from 05/01/2024 in [facility] at [facility]   C-SSRS RISK CATEGORY No Risk No Risk No Risk     Assessment and Plan: Tiffany Sanchez is a 28 year old African-American female who is single, has a history of bipolar disorder, insomnia, migraine headaches, was evaluated by telemedicine today.  This test assessment and plan as noted below.  Bipolar disorder type II depressed with anxious distress moderate in remission Currently overall doing well on the current medication regimen. Continue Lexapro  10 mg daily-reduced dosage Continue Lamictal  100 mg daily in divided dosage Continue Hydroxyzine  10 mg 3 times a day as needed for anxiety Continue psychotherapy sessions with [facility].  Insomnia-stable Currently denies any significant sleep problems. Continue Trazodone  50 mg at bedtime Continue Hydroxyzine  as needed at bedtime Continue sleep hygiene techniques  Rule out ADHD-patient was referred for testing/pending.  Follow-up Follow-up in clinic in 3 months or sooner in person.    Consent: Patient/Guardian gives verbal consent for treatment and assignment of benefits for services provided during this visit. Patient/Guardian expressed understanding and agreed to proceed.   This note was generated in part or whole with voice recognition software. Voice recognition is usually quite accurate but there are transcription errors that can and very often do occur. I apologize for any typographical errors that were not detected and corrected.    Johnjoseph Rolfe, MD 07/24/2024, 7:08 AM

## 2024-09-11 ENCOUNTER — Ambulatory Visit (HOSPITAL_COMMUNITY): Payer: Self-pay

## 2024-09-12 ENCOUNTER — Ambulatory Visit
Admission: RE | Admit: 2024-09-12 | Discharge: 2024-09-12 | Disposition: A | Discharge status: Home / Self Care | Payer: Self-pay | Source: Ambulatory Visit | Attending: Emergency Medicine | Admitting: Emergency Medicine

## 2024-09-12 VITALS — BP 129/83 | HR 82 | Temp 98.2°F | Resp 18

## 2024-09-12 DIAGNOSIS — N898 Other specified noninflammatory disorders of vagina: Secondary | ICD-10-CM | POA: Insufficient documentation

## 2024-09-12 DIAGNOSIS — R350 Frequency of micturition: Secondary | ICD-10-CM | POA: Insufficient documentation

## 2024-09-12 LAB — POCT URINE DIPSTICK
Bilirubin, UA: NEGATIVE
Blood, UA: NEGATIVE
Glucose, UA: NEGATIVE mg/dL
Ketones, POC UA: NEGATIVE mg/dL
Leukocytes, UA: NEGATIVE
Nitrite, UA: NEGATIVE
POC PROTEIN,UA: NEGATIVE
Spec Grav, UA: >=1.03 — AB (ref 1.010–1.025)
Urobilinogen, UA: 2 U/dL — AB
pH, UA: 6 (ref 5.0–8.0)

## 2024-09-12 MED ORDER — NITROFURANTOIN MONOHYD MACRO 100 MG PO CAPS: 100.0000 mg | ORAL_CAPSULE | Freq: Two times a day (BID) | ORAL | 0 refills | Status: DC

## 2024-09-12 NOTE — ED Triage Notes (Signed)
 Patient complains urinary frequency and patient states she had a new sexually partner and had some green vaginal discharge. Patient wants complete STD check with blood work.

## 2024-09-12 NOTE — Discharge Instructions (Signed)
 Your urinalysis does not show any signs of  infection, your urine will be sent to the lab to determine exactly which bacteria is present, if any changes need to be made to your medications you will be notified  Begin use of Macrobid  twice daily for 5 days as you are symptomatic  Vaginal swab checking for gonorrhea chlamydia trichomoniasis and yeast pending 2 to 3 days, blood work checking for HIV and screening for 2 to 3 days, you will be notified of positive test results only and treatment sent at time of notification, if positive for gonorrhea you will have to come back to the clinic, please refrain from sexual intercourse until lab results and all treatments are completed  You may use over-the-counter Azo to help minimize your symptoms until antibiotic removes bacteria, this medication will turn your urine orange  Increase your fluid intake through use of water  As always practice good hygiene, wiping front to back and avoidance of scented vaginal products to prevent further irritation  If symptoms continue to persist after use of medication or recur please follow-up with urgent care or your primary doctor as needed

## 2024-09-12 NOTE — ED Provider Notes (Signed)
 Tiffany Sanchez    CSN: [id number] Arrival date & time: 09/12/24  1229      History   Chief Complaint Chief Complaint  Patient presents with   Urinary Frequency    Entered by patient   Exposure to STD    HPI Tiffany Sanchez is a 28 y.o. female.   Patient presents for evaluation of mild vaginal itching and 2 occurrences of green vaginal discharge occurring 5 to 6 days ago, has resolved.  Over the past 7 days has experienced urinary frequency, dysuria and intermittent lower back pain.  Has not attempted treatment.  New sexual partner 7 days ago, took Plan B  4 days ago, experienced 2 days of abdominal cramping before resolution.  Denies vaginal bleeding or hematuria.  Last menstrual period 08/21/2024.  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 Of note, also had LGSIL pap in 2018 ([facility] results), but had normal pap in 2019.    Ovarian cyst    Trichomonal vaginitis 04/20/2020   Late may 2021   UTI (urinary tract infection)     Patient Active Problem List   Diagnosis Date Noted   Drug reaction 04/24/2024   Bipolar disorder, in full remission, most recent episode depressed 04/24/2024   Bipolar disorder, in partial remission, most recent episode depressed (HCC) 03/30/2024   Attention and concentration deficit 01/06/2024   S/P bariatric surgery 05/08/2023   Iron deficiency anemia  due to chronic blood loss 05/08/2023   Asthma 09/19/2022   Bipolar II disorder, mild, depressed, with anxious distress (HCC) 09/19/2022   Insomnia 09/19/2022   At risk for prolonged QT interval syndrome 08/20/2022   Abnormal uterine bleeding 06/06/2022   Hypertension, benign essential, goal below 140/90 01/25/2022   GERD (gastroesophageal reflux disease) 01/25/2022   Menorrhagia with regular cycle 01/10/2022   Dysplasia of cervix, low grade (CIN 1) 12/22/2020   Low grade squamous intraepithelial lesion (LGSIL) at risk for high grade lesion on pap smear 11/10/20 11/10/2020   Family history of breast cancer 10/22/2019   BMI 50.0-59.9, adult (HCC) 06/03/2018   Genital herpes 06/03/2018   History of gestational hypertension 12/20/2017   History of shoulder dystocia in prior pregnancy, currently pregnant 12/20/2017    Past Surgical History:  Procedure Laterality Date   CESAREAN SECTION N/A 06/28/2018   Procedure: CESAREAN SECTION;  Surgeon: Barbra Lang PARAS, DO;  Location: [facility];  Service: Obstetrics;  Laterality: N/A;   THERAPEUTIC ABORTION      OB History     Gravida  5   Para  2   Term  2   Preterm      AB  2   Living  2      SAB      IAB  1   Ectopic      Multiple  0   Live Births  2  Home Medications    Prior to Admission medications   Medication Sig Start Date End Date Taking? Authorizing Provider  nitrofurantoin , macrocrystal-monohydrate, (MACROBID ) 100 MG capsule Take 1 capsule (100 mg total) by mouth 2 (two) times daily. 09/12/24  Yes Tiana Sivertson R, NP  Albuterol  Sulfate, sensor, 108 (90 Base) MCG/ACT AEPB Inhale into the lungs.    [provider]  benzoyl peroxide -erythromycin  (BENZAMYCIN) gel Apply topically 2 (two) times daily. Can bleach fabric; allow to dry completely. Cut back to once daily if too drying on skin Patient not taking: Reported on 05/01/2024 06/22/23   Crain, Whitney L, PA  calcium carbonate (TUMS  EX) 750 MG chewable tablet Chew by mouth.    [provider]  calcium citrate (CALCITRATE - DOSED IN MG ELEMENTAL CALCIUM) 950 (200 Ca) MG tablet Take 200 mg of elemental calcium by mouth daily.    [provider]  cetirizine (ZYRTEC) 10 MG tablet Take by mouth.    [provider]  cholecalciferol (VITAMIN D3) 25 MCG (1000 UNIT) tablet Take 1,000 Units by mouth daily.    [provider]  erythromycin  ophthalmic ointment Place a 1/2 inch ribbon of ointment into the lower eyelid four times a day for 7 days. Patient not taking: Reported on 06/09/2024 05/01/24   Corlis Burnard DEL, NP  escitalopram  (LEXAPRO ) 20 MG tablet Take 0.5 tablets (10 mg total) by mouth daily. 06/27/24   Eappen, Saramma, MD  ferrous sulfate  325 (65 FE) MG tablet Take 1 tablet by mouth daily with breakfast. 06/12/22   [provider]  fluticasone (FLONASE) 50 MCG/ACT nasal spray Place into the nose.    [provider]  hydrOXYzine  (ATARAX ) 10 MG tablet Take 1 tablet (10 mg total) by mouth 3 (three) times daily as needed for anxiety. 08/13/23   Eappen, Saramma, MD  lamoTRIgine  (LAMICTAL ) 100 MG tablet TAKE 0.5 TABLETS BY MOUTH 2 TIMES DAILY. 07/13/24   Eappen, Saramma, MD  metroNIDAZOLE  (FLAGYL ) 500 MG tablet Take 1 tablet (500 mg total) by mouth 2 (two) times daily. 06/09/24   Teresa Shelba SAUNDERS, NP  Multiple Vitamins-Iron (MULTIVITAMINS WITH IRON) TABS tablet Take 1 tablet by mouth daily.    [provider]  norethindrone  (AYGESTIN ) 5 MG tablet Take 1 tablet (5 mg total) by mouth daily. Patient not taking: Reported on 06/09/2024 11/26/23   Anyanwu, Ugonna A, MD  omeprazole (PRILOSEC) 20 MG capsule TAKE 1 CAPSULE EVERY DAY FOR ACID REFLUX AS NEEDED 11/06/21   [provider]  ondansetron  (ZOFRAN -ODT) 8 MG disintegrating tablet Take by mouth. Patient not taking: Reported on 05/01/2024 09/18/22   [provider]  Semaglutide-Weight Management (WEGOVY) 0.25 MG/0.5ML SOAJ  Inject into the skin once a week. 12/16/23   [provider]  SUMAtriptan (IMITREX) 50 MG tablet  11/06/21   [provider]  traZODone  (DESYREL ) 50 MG tablet Take 0.5-1 tablets (25-50 mg total) by mouth at bedtime as needed for sleep. Patient not taking: Reported on 06/09/2024 06/01/24   Eappen, Saramma, MD  valACYclovir  (VALTREX ) 1000 MG tablet  04/27/24   [provider]    Family History Family History  Problem Relation Age of Onset   Alcohol abuse Mother    Depression Mother    Hypertension Mother    Hypertension Father    Drug abuse Brother    Bipolar disorder Brother    Diabetes Paternal Grandfather     Social History Social History   Tobacco Use   Smoking status: Never  Smokeless tobacco: Never  Vaping Use   Vaping status: Never Used  Substance Use Topics   Alcohol use: Yes    Comment: every 2-3 months   Drug use: No     Allergies   Penicillins   Review of Systems Review of Systems  Genitourinary:  Positive for frequency.     Physical Exam Triage Vital Signs ED Triage Vitals  Encounter Vitals Group     BP 09/12/24 1306 129/83     Girls Systolic BP Percentile --      Girls Diastolic BP Percentile --      Boys Systolic BP Percentile --      Boys Diastolic BP Percentile --      Pulse Rate 09/12/24 1306 82     Resp 09/12/24 1306 18     Temp 09/12/24 1306 98.2 F (36.8 C)     Temp Source 09/12/24 1306 Oral     SpO2 09/12/24 1306 100 %     Weight --      Height --      Head Circumference --      Peak Flow --      Pain Score 09/12/24 1309 0     Pain Loc --      Pain Education --      Exclude from Growth Chart --    No data found.  Updated Vital Signs BP 129/83 (BP Location: Left Arm)   Pulse 82   Temp 98.2 F (36.8 C) (Oral)   Resp 18   LMP 08/21/2024 (Approximate)   SpO2 100%   Visual Acuity Right Eye Distance:   Left Eye Distance:   Bilateral Distance:    Right Eye Near:   Left Eye Near:    Bilateral  Near:     Physical Exam Constitutional:      Appearance: Normal appearance.  Eyes:     Extraocular Movements: Extraocular movements intact.  Pulmonary:     Effort: Pulmonary effort is normal.  Abdominal:     Tenderness: There is abdominal tenderness in the suprapubic area. There is no right CVA tenderness or left CVA tenderness.  Neurological:     Mental Status: She is alert and oriented to person, place, and time. Mental status is at baseline.      UC Treatments / Results  Labs (all labs ordered are listed, but only abnormal results are displayed) Labs Reviewed  POCT URINE DIPSTICK - Abnormal; Notable for the following components:      Result Value   Clarity, UA cloudy (*)    Spec Grav, UA >=1.030 (*)    Urobilinogen, UA 2.0 (*)    All other components within normal limits  URINE CULTURE  RPR  HIV ANTIBODY (ROUTINE TESTING W REFLEX)  CERVICOVAGINAL ANCILLARY ONLY    EKG   Radiology No results found.  Procedures Procedures (including critical care time)  Medications Ordered in UC Medications - No data to display  Initial Impression / Assessment and Plan / UC Course  I have reviewed the triage vital signs and the nursing notes.  Pertinent labs & imaging results that were available during my care of the patient were reviewed by me and considered in my medical decision making (see chart for details).  Urinary frequency, vaginal discharge   Urinalysis negative for leukocytes and nitrates, sent for culture and empirically prescribing Macrobid  as she is symptomatic ,STI labs pending will treat per protocol, advised abstinence until lab results, and/or treatment is complete, advised condom use during  all sexual encounters moving, may follow-up with urgent care as needed  Final Clinical Impressions(s) / UC Diagnoses   Final diagnoses:  Urinary frequency  Vaginal discharge     Discharge Instructions      Your urinalysis does not show any signs of  infection,  your urine will be sent to the lab to determine exactly which bacteria is present, if any changes need to be made to your medications you will be notified  Begin use of Macrobid  twice daily for 5 days as you are symptomatic  Vaginal swab checking for gonorrhea chlamydia trichomoniasis and yeast pending 2 to 3 days, blood work checking for HIV and screening for 2 to 3 days, you will be notified of positive test results only and treatment sent at time of notification, if positive for gonorrhea you will have to come back to the clinic, please refrain from sexual intercourse until lab results and all treatments are completed  You may use over-the-counter Azo to help minimize your symptoms until antibiotic removes bacteria, this medication will turn your urine orange  Increase your fluid intake through use of water  As always practice good hygiene, wiping front to back and avoidance of scented vaginal products to prevent further irritation  If symptoms continue to persist after use of medication or recur please follow-up with urgent care or your primary doctor as needed    ED Prescriptions     Medication Sig Dispense Auth. Provider   nitrofurantoin , macrocrystal-monohydrate, (MACROBID ) 100 MG capsule Take 1 capsule (100 mg total) by mouth 2 (two) times daily. 10 capsule Wilkes Potvin R, NP      PDMP not reviewed this encounter.   Teresa Shelba SAUNDERS, NP 09/12/24 1330

## 2024-09-14 ENCOUNTER — Ambulatory Visit (HOSPITAL_COMMUNITY): Payer: Self-pay

## 2024-09-14 LAB — URINE CULTURE: Culture: NO GROWTH

## 2024-09-14 LAB — CERVICOVAGINAL ANCILLARY ONLY
Bacterial Vaginitis (gardnerella): NEGATIVE
Candida Glabrata: NEGATIVE
Candida Vaginitis: NEGATIVE
Chlamydia: NEGATIVE
Comment: NEGATIVE
Comment: NEGATIVE
Comment: NEGATIVE
Comment: NEGATIVE
Comment: NEGATIVE
Comment: NORMAL
Neisseria Gonorrhea: NEGATIVE
Trichomonas: NEGATIVE

## 2024-09-14 NOTE — Telephone Encounter (Signed)
 Created in error

## 2024-09-15 LAB — HIV ANTIBODY (ROUTINE TESTING W REFLEX): HIV Screen 4th Generation wRfx: NONREACTIVE

## 2024-09-15 LAB — RPR: RPR Ser Ql: NONREACTIVE

## 2024-10-02 ENCOUNTER — Other Ambulatory Visit (HOSPITAL_COMMUNITY): Payer: Self-pay

## 2024-10-02 MED ORDER — FLUZONE 0.5 ML IM SUSY
0.5000 mL | PREFILLED_SYRINGE | Freq: Once | INTRAMUSCULAR | 0 refills | Status: AC
  Filled 2024-10-02: qty 0.5, 1d supply, fill #0

## 2024-10-29 ENCOUNTER — Telehealth: Payer: Self-pay | Admitting: Psychiatry

## 2024-10-29 ENCOUNTER — Ambulatory Visit: Admitting: Psychiatry

## 2024-10-29 NOTE — Telephone Encounter (Signed)
 Patient appointment to be rescheduled to an in person visit at the next available since patient unable to keep the appointment today.

## 2024-11-30 ENCOUNTER — Emergency Department (HOSPITAL_COMMUNITY): Admission: EM | Admit: 2024-11-30 | Discharge: 2024-11-30 | Disposition: A | Discharge status: Home / Self Care

## 2024-11-30 ENCOUNTER — Encounter (HOSPITAL_COMMUNITY): Payer: Self-pay

## 2024-11-30 ENCOUNTER — Other Ambulatory Visit: Payer: Self-pay

## 2024-11-30 ENCOUNTER — Emergency Department (HOSPITAL_COMMUNITY)

## 2024-11-30 DIAGNOSIS — Y92410 Unspecified street and highway as the place of occurrence of the external cause: Secondary | ICD-10-CM | POA: Diagnosis not present

## 2024-11-30 DIAGNOSIS — S161XXA Strain of muscle, fascia and tendon at neck level, initial encounter: Secondary | ICD-10-CM | POA: Insufficient documentation

## 2024-11-30 DIAGNOSIS — M25512 Pain in left shoulder: Secondary | ICD-10-CM | POA: Insufficient documentation

## 2024-11-30 DIAGNOSIS — M542 Cervicalgia: Secondary | ICD-10-CM | POA: Diagnosis present

## 2024-11-30 MED ORDER — IBUPROFEN 400 MG PO TABS
600.0000 mg | ORAL_TABLET | Freq: Once | ORAL | Status: AC
  Administered 2024-11-30: 600 mg via ORAL
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
## 2024-11-30 NOTE — Discharge Instructions (Addendum)
 As discussed, you can use Tylenol  and/or ibuprofen  as needed for pain, apply heat directly to the affected area, and follow-up with your primary care provider as needed.  Imaging did not show any concerning findings, though you have any further concerns after your visit here today prior to following up with your primary care, come back to the emergency department for further evaluation.

## 2024-11-30 NOTE — ED Notes (Signed)
  Discharge instructions provided to pt. Voiced understanding. No questions at this time. Pt alert and oriented x 4. Ambulatory without difficulty noted.
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
## 2024-12-07 ENCOUNTER — Encounter: Payer: Self-pay | Admitting: Psychiatry

## 2024-12-07 ENCOUNTER — Ambulatory Visit: Admitting: Psychiatry

## 2024-12-07 VITALS — BP 118/82 | HR 86 | Temp 98.0°F | Ht 66.5 in | Wt 207.0 lb

## 2024-12-07 DIAGNOSIS — F3176 Bipolar disorder, in full remission, most recent episode depressed: Secondary | ICD-10-CM

## 2024-12-07 DIAGNOSIS — R41840 Attention and concentration deficit: Secondary | ICD-10-CM | POA: Diagnosis not present

## 2024-12-07 DIAGNOSIS — F419 Anxiety disorder, unspecified: Secondary | ICD-10-CM | POA: Diagnosis not present

## 2024-12-07 DIAGNOSIS — G4701 Insomnia due to medical condition: Secondary | ICD-10-CM

## 2024-12-07 MED ORDER — LAMOTRIGINE 100 MG PO TABS: 50.0000 mg | ORAL_TABLET | Freq: Two times a day (BID) | ORAL | 0 refills | Status: AC

## 2024-12-07 MED ORDER — ESCITALOPRAM OXALATE 20 MG PO TABS: 10.0000 mg | ORAL_TABLET | Freq: Every day | ORAL | 0 refills | Status: AC
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
## 2024-12-09 MED ORDER — LAMOTRIGINE 25 MG PO TABS: 75.0000 mg | ORAL_TABLET | Freq: Every day | ORAL | Status: AC

## 2024-12-10 ENCOUNTER — Telehealth: Payer: Self-pay | Admitting: Psychiatry

## 2024-12-10 NOTE — Telephone Encounter (Signed)
 I have completed intermittent FMLA form-12/07/2024-06/17/2025-3 times per month.

## 2024-12-10 NOTE — Telephone Encounter (Signed)
 There is no fax number available on the paperwork, called patient to see if she had the fax number she stated that she did not have it and would pick the paperwork up on Friday 12-11-24 copy was made for patient and original put up front for scan.

## 2024-12-10 NOTE — Telephone Encounter (Signed)
 Note completed in separate phone message disregard

## 2024-12-11 ENCOUNTER — Telehealth: Payer: Self-pay | Admitting: Psychiatry

## 2024-12-11 NOTE — Telephone Encounter (Signed)
 Patient was to pick up FMLA paperwork in the office 12/11/24. She called stating she can't make it and requested it to be mailed to her home address. Address was confirmed with patient. She was also informed that our mail will not be picked until Wednesday of this coming up week, she understood.

## 2025-01-25 ENCOUNTER — Ambulatory Visit: Admitting: Psychiatry
